# Patient Record
Sex: Male | Born: 1950 | Race: White | Hispanic: No | Marital: Married | Smoking: Former smoker
Health system: Southern US, Community
[De-identification: ages and names within clinical notes are randomized; demographics above are authoritative.]

## PROBLEM LIST (undated history)

## (undated) DIAGNOSIS — H40009 Preglaucoma, unspecified, unspecified eye: Secondary | ICD-10-CM

## (undated) DIAGNOSIS — R001 Bradycardia, unspecified: Secondary | ICD-10-CM

## (undated) DIAGNOSIS — M1990 Unspecified osteoarthritis, unspecified site: Secondary | ICD-10-CM

## (undated) DIAGNOSIS — E785 Hyperlipidemia, unspecified: Secondary | ICD-10-CM

## (undated) DIAGNOSIS — K635 Polyp of colon: Secondary | ICD-10-CM

## (undated) HISTORY — PX: BUNIONECTOMY: SHX129

## (undated) HISTORY — DX: Hyperlipidemia, unspecified: E78.5

## (undated) HISTORY — DX: Bradycardia, unspecified: R00.1

## (undated) HISTORY — DX: Preglaucoma, unspecified, unspecified eye: H40.009

## (undated) HISTORY — DX: Polyp of colon: K63.5

## (undated) HISTORY — PX: PEG TUBE PLACEMENT: SUR1034

## (undated) HISTORY — DX: Unspecified osteoarthritis, unspecified site: M19.90

## (undated) HISTORY — PX: SHOULDER ARTHROSCOPY: SHX128

---

## 1998-10-26 HISTORY — PX: NASAL SEPTUM SURGERY: SHX37

## 1998-12-02 ENCOUNTER — Ambulatory Visit: Admission: RE | Admit: 1998-12-02 | Discharge: 1998-12-02 | Payer: Self-pay | Admitting: Otolaryngology

## 1999-01-24 ENCOUNTER — Other Ambulatory Visit: Admission: RE | Admit: 1999-01-24 | Discharge: 1999-01-24 | Payer: Self-pay | Admitting: Otolaryngology

## 2004-07-25 LAB — HM DIABETES EYE EXAM: HM Diabetic Eye Exam: NEGATIVE

## 2004-08-08 ENCOUNTER — Ambulatory Visit (HOSPITAL_COMMUNITY): Admission: RE | Admit: 2004-08-08 | Discharge: 2004-08-08 | Payer: Self-pay | Admitting: *Deleted

## 2004-08-08 ENCOUNTER — Encounter (INDEPENDENT_AMBULATORY_CARE_PROVIDER_SITE_OTHER): Payer: Self-pay | Admitting: Specialist

## 2010-07-10 ENCOUNTER — Ambulatory Visit
Admission: RE | Admit: 2010-07-10 | Discharge: 2010-07-11 | Payer: Self-pay | Source: Home / Self Care | Admitting: Orthopedic Surgery

## 2010-11-10 ENCOUNTER — Ambulatory Visit
Admission: RE | Admit: 2010-11-10 | Discharge: 2010-11-10 | Payer: Self-pay | Source: Home / Self Care | Attending: Orthopedic Surgery | Admitting: Orthopedic Surgery

## 2010-11-12 LAB — POCT I-STAT 4, (NA,K, GLUC, HGB,HCT)
Glucose, Bld: 132 mg/dL — ABNORMAL HIGH (ref 70–99)
HCT: 43 % (ref 39.0–52.0)
Hemoglobin: 14.6 g/dL (ref 13.0–17.0)
Potassium: 4.1 mEq/L (ref 3.5–5.1)
Sodium: 142 mEq/L (ref 135–145)

## 2010-11-12 LAB — GLUCOSE, CAPILLARY: Glucose-Capillary: 107 mg/dL — ABNORMAL HIGH (ref 70–99)

## 2011-01-08 LAB — GLUCOSE, CAPILLARY
Glucose-Capillary: 139 mg/dL — ABNORMAL HIGH (ref 70–99)
Glucose-Capillary: 143 mg/dL — ABNORMAL HIGH (ref 70–99)
Glucose-Capillary: 155 mg/dL — ABNORMAL HIGH (ref 70–99)
Glucose-Capillary: 184 mg/dL — ABNORMAL HIGH (ref 70–99)

## 2011-01-08 LAB — POCT I-STAT 4, (NA,K, GLUC, HGB,HCT)
Glucose, Bld: 136 mg/dL — ABNORMAL HIGH (ref 70–99)
HCT: 47 % (ref 39.0–52.0)
Hemoglobin: 16 g/dL (ref 13.0–17.0)
Potassium: 4.4 mEq/L (ref 3.5–5.1)
Sodium: 140 mEq/L (ref 135–145)

## 2011-04-07 ENCOUNTER — Encounter (INDEPENDENT_AMBULATORY_CARE_PROVIDER_SITE_OTHER): Payer: BC Managed Care – PPO | Admitting: Vascular Surgery

## 2011-04-07 ENCOUNTER — Encounter (INDEPENDENT_AMBULATORY_CARE_PROVIDER_SITE_OTHER): Payer: BC Managed Care – PPO

## 2011-04-07 DIAGNOSIS — M79609 Pain in unspecified limb: Secondary | ICD-10-CM

## 2011-04-07 DIAGNOSIS — R0989 Other specified symptoms and signs involving the circulatory and respiratory systems: Secondary | ICD-10-CM

## 2011-04-07 NOTE — Consult Note (Signed)
NEW PATIENT CONSULTATION  [name] [name], [name] [name] DOB:  Jun 06, 1951                                       04/07/2011 CHART#:[id number]  This 60 year old male patient had rotator cuff surgery on the left performed by Dr. Simonne Come in the fall of 2011 and has a similar situation in his right shoulder.  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  Dr. Simonne Come noted that the patient had a decreased pulse on the left upper extremity when the arm was in abduction and external rotation, which is a position he works on during his rehab.  The patient's arm goes dead and heavy for a short period time until the arm position is changed.  He denies any history of DVT in the upper extremity or lower extremity and does not have chronic arm pain in the hand, forearm, and upper arm prior to the shoulder surgery.  CHRONIC MEDICAL PROBLEMS: 1. Diabetes mellitus. 2. Degenerative joint disease. 3. Hyperlipidemia. 4. Negative for coronary artery disease or stroke.  SOCIAL HISTORY:  He is married, has 2 children.  Works as an Teacher, adult education for [employer] but has not been able to work since his surgery.  Smokes a half pack of cigarettes per day and does not use alcohol.  FAMILY HISTORY:  Positive for varicose veins in his mother.  Negative for coronary artery disease, diabetes, and stroke.  REVIEW OF SYSTEMS:  Positive for joint pain but otherwise totally negative in complete review of systems.  PHYSICAL EXAMINATION:  Blood pressure 118/68, heart rate 48, respirations 14.  General:  He is a well-developed and well-nourished male in no apparent distress, alert and oriented x3.  HEENT:  Normal for age.  EOMs intact.  Lungs:  Clear to auscultation.  No rhonchi or wheezing.  Cardiovascular:  Regular rate and rhythm.  No murmurs. Carotid pulses are 3+.  No audible bruits.  Abdomen:  Obese.  No palpable masses.  Musculoskeletal:   Free of major deformities with the exception of the shoulders.  Neurologic:  Normal.  Skin:  Free of rashes.  Lower extremity exam reveals 3+ femoral and dorsalis pedis pulses bilaterally.  Upper extremity exam reveals 3+ brachial and radial pulses at rest.  His left arm has difficulty obtaining more than 90 degrees of abduction, and external rotation is quite painful for the gentleman.  He is able to perform this.  There is a decrease in the radial pulse in that position.  The right arm does also not have free motion in the shoulder but at 90 degrees of abduction and external rotation, no radial pulse loss is noted.  Both hands are well-perfused.  Today I ordered upper extremity arterial Dopplers with standard thoracic outlet syndrome maneuvers.  The pressures and waveforms in both upper extremities are normal at rest and with standard maneuvers, there is no diminution.  The patient is unable to perform 180 degrees of extension because of his rotator cuff surgery.  The test is mildly abnormal with some loss of waveforms in certain positions.  I do not think that the patient requires any treatment for thoracic outlet at this time because of the difficulties having with his shoulder joint.  It is certainly not affecting the healing of the shoulder joint or the surgery.  I do not think consideration  for removal of the first rib should be made at this time because of the proximity to his shoulder surgery and the fact that this is not clear.  He could very well be having some compression of the artery in certain positions, which the normal population experiences to some degree.  After further healing and return of range of motion if he continues to have problems, I think he should be referred to Dr. Leonia [name] at [facility], who is a thoracic surgeon who specializes in thoracic outlet type problems for further evaluation. I discussed this at length with the patient.    [name] [name],  M.D. Electronically Signed  JDL/MEDQ  D:  04/07/2011  T:  04/07/2011  Job:  [id number]  cc:   [name] [name], M.D.

## 2011-04-10 DIAGNOSIS — R0989 Other specified symptoms and signs involving the circulatory and respiratory systems: Secondary | ICD-10-CM

## 2011-05-29 ENCOUNTER — Ambulatory Visit (HOSPITAL_BASED_OUTPATIENT_CLINIC_OR_DEPARTMENT_OTHER)
Admission: RE | Admit: 2011-05-29 | Discharge: 2011-05-29 | Disposition: A | Discharge status: Home / Self Care | Payer: BC Managed Care – PPO | Source: Ambulatory Visit | Attending: Orthopedic Surgery | Admitting: Orthopedic Surgery

## 2011-05-29 DIAGNOSIS — M7500 Adhesive capsulitis of unspecified shoulder: Secondary | ICD-10-CM | POA: Insufficient documentation

## 2011-05-29 DIAGNOSIS — M24619 Ankylosis, unspecified shoulder: Secondary | ICD-10-CM | POA: Insufficient documentation

## 2011-05-29 DIAGNOSIS — M659 Unspecified synovitis and tenosynovitis, unspecified site: Secondary | ICD-10-CM | POA: Insufficient documentation

## 2011-05-29 DIAGNOSIS — M24119 Other articular cartilage disorders, unspecified shoulder: Secondary | ICD-10-CM | POA: Insufficient documentation

## 2011-05-29 DIAGNOSIS — E119 Type 2 diabetes mellitus without complications: Secondary | ICD-10-CM | POA: Insufficient documentation

## 2011-05-29 DIAGNOSIS — Z01812 Encounter for preprocedural laboratory examination: Secondary | ICD-10-CM | POA: Insufficient documentation

## 2011-05-29 DIAGNOSIS — F17200 Nicotine dependence, unspecified, uncomplicated: Secondary | ICD-10-CM | POA: Insufficient documentation

## 2011-05-29 LAB — GLUCOSE, CAPILLARY: Glucose-Capillary: 90 mg/dL (ref 70–99)

## 2011-05-29 LAB — POCT I-STAT 4, (NA,K, GLUC, HGB,HCT)
Glucose, Bld: 104 mg/dL — ABNORMAL HIGH (ref 70–99)
HCT: 43 % (ref 39.0–52.0)
Hemoglobin: 14.6 g/dL (ref 13.0–17.0)
Potassium: 4 mEq/L (ref 3.5–5.1)
Sodium: 140 mEq/L (ref 135–145)

## 2011-06-08 NOTE — Op Note (Signed)
[name], [name] [name]                  ACCOUNT NO.:  [id number]  MEDICAL RECORD NO.:  [id number]  LOCATION:                                 FACILITY:  PHYSICIAN:  Marlowe Kays, [name].D.  DATE OF BIRTH:  [birth year]  DATE OF PROCEDURE:  05/29/2011 DATE OF DISCHARGE:                              OPERATIVE REPORT   PREOPERATIVE DIAGNOSES: 1. Diffuse labral tear. 2. Adhesive capsulitis, right shoulder.  POSTOPERATIVE DIAGNOSES: 1. Diffuse labral tear. 2. Adhesive capsulitis, right shoulder.  OPERATION: 1. Right shoulder arthroscopy with debridement of degenerative torn     labrum. 2. Closed manipulation, right shoulder.  SURGEON:  Marlowe Kays, [name].D.  ASSISTANTDruscilla Brownie. Cherlynn June.  ANESTHESIA:  General.  PATHOLOGY AND JUSTIFICATION FOR PROCEDURE:  He has had persistent pain and loss of motion despite physical therapy in his right shoulder.  MRI of April 30, 2011, demonstrated preoperative diagnoses.  He is here today for the above-mentioned surgery accordingly.  DESCRIPTION OF PROCEDURE:  Interscalene block by anesthesia, followed by general anesthesia, beach-chair position on the sliding frame, right shoulder girdle was prepped with DuraPrep, draped in sterile field. Time-out performed.  I first checked his motion prior to the arthroscopy and found he had about 30 degrees of abduction, 30 degrees of flexion, and basically no external or internal rotation.  I then marked out the anatomy of the shoulder and through a posterior soft-spot portal atraumatically entered glenohumeral joint, although with some difficulty because of the tightness of the joint.  On inspection, there was some synovitis present and the line of the joint was quite inflamed.  It was very difficult.  He had been examining the anterior of the joint because of its tightness.  Biceps tendon was noted to be intact at its base or attachment to the labrum.  Degenerative-type tearing could be found  both anteriorly and posteriorly to it.  There was some wear of the glenoid, but humeral head looked good.  Because I was unable to get the normal interval between the biceps tendon subscapularis, I went medial to the biceps tendon and using a switching stick, made an anterior incision over which I placed a metal cannula and entered the joint of the 4.2 shaver.  With the shaver, I was able to retract the biceps tendon medially to look at the glenoid in better fashion and debrided the labrum down in its entirety.  Final pictures were taken and then removed all fluid possible from the glenohumeral joint and went ahead and perform a closed manipulation of his shoulder increasing abduction from 30 to 110 degrees, flexion from 30 to 110 degrees, external rotation from 0 to 90, and internal rotation from 0 to 45.  There was no audible or palpable popping of adhesions, with just general gradual smudging in improvement of the motion, then closed the 2 portals with a 4-0 nylon followed by Betadine, Adaptic, dry sterile dressing, shoulder immobilizer.  He tolerated the procedure well, and at the time of this dictation was on his way to the recovery room in satisfactory condition with no known complications.          ______________________________ Marlowe Kays, [name].D.  JA/MEDQ  D:  05/29/2011  T:  05/30/2011  Job:  [id number]  Electronically Signed by Marlowe Kays [name].D. on 06/08/2011 01:09:27 PM

## 2012-05-04 ENCOUNTER — Encounter: Payer: Self-pay | Admitting: Internal Medicine

## 2012-05-04 ENCOUNTER — Ambulatory Visit (INDEPENDENT_AMBULATORY_CARE_PROVIDER_SITE_OTHER): Payer: BC Managed Care – PPO | Admitting: Internal Medicine

## 2012-05-04 VITALS — BP 130/74 | HR 50 | Temp 97.6°F | Ht 77.0 in | Wt 280.0 lb

## 2012-05-04 DIAGNOSIS — R001 Bradycardia, unspecified: Secondary | ICD-10-CM

## 2012-05-04 DIAGNOSIS — E1129 Type 2 diabetes mellitus with other diabetic kidney complication: Secondary | ICD-10-CM | POA: Insufficient documentation

## 2012-05-04 DIAGNOSIS — M1990 Unspecified osteoarthritis, unspecified site: Secondary | ICD-10-CM | POA: Insufficient documentation

## 2012-05-04 DIAGNOSIS — I498 Other specified cardiac arrhythmias: Secondary | ICD-10-CM

## 2012-05-04 DIAGNOSIS — M25519 Pain in unspecified shoulder: Secondary | ICD-10-CM

## 2012-05-04 DIAGNOSIS — E782 Mixed hyperlipidemia: Secondary | ICD-10-CM | POA: Insufficient documentation

## 2012-05-04 DIAGNOSIS — E785 Hyperlipidemia, unspecified: Secondary | ICD-10-CM

## 2012-05-04 DIAGNOSIS — E119 Type 2 diabetes mellitus without complications: Secondary | ICD-10-CM

## 2012-05-04 HISTORY — DX: Bradycardia, unspecified: R00.1

## 2012-05-04 LAB — AST: AST: 16 U/L (ref 0–37)

## 2012-05-04 LAB — LIPID PANEL
Cholesterol: 139 mg/dL (ref 0–200)
HDL: 52.6 mg/dL (ref >39.00)
LDL Cholesterol: 70 mg/dL (ref 0–99)
Total CHOL/HDL Ratio: 3
Triglycerides: 80 mg/dL (ref 0.0–149.0)
VLDL: 16 mg/dL (ref 0.0–40.0)

## 2012-05-04 LAB — BASIC METABOLIC PANEL
BUN: 20 mg/dL (ref 6–23)
CO2: 26 mEq/L (ref 19–32)
Calcium: 9 mg/dL (ref 8.4–10.5)
Chloride: 105 mEq/L (ref 96–112)
Creatinine, Ser: 1.1 mg/dL (ref 0.4–1.5)
GFR: 74.18 mL/min (ref >60.00)
Glucose, Bld: 135 mg/dL — ABNORMAL HIGH (ref 70–99)
Potassium: 3.9 mEq/L (ref 3.5–5.1)
Sodium: 139 mEq/L (ref 135–145)

## 2012-05-04 LAB — ALT: ALT: 20 U/L (ref 0–53)

## 2012-05-04 LAB — HEMOGLOBIN A1C: Hgb A1c MFr Bld: 7.3 % — ABNORMAL HIGH (ref 4.6–6.5)

## 2012-05-04 MED ORDER — METFORMIN HCL 1000 MG PO TABS: 1000.0000 mg | ORAL_TABLET | Freq: Two times a day (BID) | ORAL | Status: DC

## 2012-05-04 NOTE — Assessment & Plan Note (Signed)
Good medication compliance, patient is fasting, will check FLP

## 2012-05-04 NOTE — Assessment & Plan Note (Signed)
Good medication compliance, blood sugars used to be around 120, here lately going up to 140 sometimes. Plan: Refill medicines Labs Exercise discussed

## 2012-05-04 NOTE — Progress Notes (Addendum)
  Subjective:    Patient ID: [name] [name], male    DOB: May 28, 1951, 61 y.o.   MRN: [id number]  HPI New patient Transferring  From [facility]  Diabetes, good medication compliance, his blood sugars have been usually around 120 but here lately they have increased to 140. Does not have hypertension, take ACE inhibitors due to diabetes. High cholesterol, good medication compliance, he is fasting today.  Past medical history Diabetes High cholesterol Colon polyps at age 22, next Cscope age 64  Past surgical history Deviated septum 2000 arthroscopic Left shoulder 2011 Arthroscopic Right shoulder 2012  Social history Married, Children x 2 Tobacco-- quit 10-2011  ETOH-- socially  Education HS, occupation Psychologist, sport and exercise   Family history Diabetes--uncle CAD-- no Stroke-- uncle  Colon cancer-- no Lung  cancer-- F Prostate cancer--no   Review of Systems No nausea, vomiting, diarrhea. No blood in the stools. No lower extremity edema Has ongoing shoulder discomfort, sees orthopedic surgery regularly, symptoms causesome difficulty sleeping.    Objective:   Physical Exam General -- alert, well-developed Lungs -- normal respiratory effort, no intercostal retractions, no accessory muscle use, and normal breath sounds.   Heart-- normal rate, regular rhythm, no murmur, and no gallop.   Extremities-- no pretibial edema bilaterally Neurologic-- alert & oriented X3 and strength normal in all extremities. Psych-- Cognition and judgment appear intact. Alert and cooperative with normal attention span and concentration.  not anxious appearing and not depressed appearing.       Assessment & Plan:   Several pages of old records reviewed today, I did not see a report of a colonoscopy. Kidney function has been consistently normal 02/04/2011  Total cholesterol 169, triglycerides 109, HDL 52, LDL 95 PSA 0.6 08/04/2011--- A1c 6.3 Will send all labs to be scanned. JP 05-20-2012

## 2012-05-04 NOTE — Assessment & Plan Note (Signed)
Pulse noted to be 50 today, states that his pulse has always been low from 40-50. He is asymptomatic.

## 2012-05-09 ENCOUNTER — Other Ambulatory Visit: Payer: Self-pay | Admitting: *Deleted

## 2012-05-09 ENCOUNTER — Telehealth: Payer: Self-pay | Admitting: *Deleted

## 2012-05-09 MED ORDER — SITAGLIPTIN PHOSPHATE 100 MG PO TABS: 100.0000 mg | ORAL_TABLET | Freq: Every day | ORAL | Status: DC

## 2012-05-09 NOTE — Telephone Encounter (Signed)
Pt left vm on triage line to request lab results, please advise

## 2012-05-09 NOTE — Telephone Encounter (Signed)
See labs Pt already aware of labs.

## 2012-05-10 ENCOUNTER — Telehealth: Payer: Self-pay | Admitting: *Deleted

## 2012-05-10 NOTE — Telephone Encounter (Signed)
Pt called triage line to request numbers for his A1C, advised 7.3 and mailed copy of results to home address, pt did note that he picked up his new RX yesterday. Pt understood all instructions/results

## 2012-05-30 ENCOUNTER — Encounter: Payer: Self-pay | Admitting: Internal Medicine

## 2012-05-30 MED ORDER — SITAGLIPTIN PHOSPHATE 100 MG PO TABS: 100.0000 mg | ORAL_TABLET | Freq: Every day | ORAL | Status: DC

## 2012-05-30 NOTE — Telephone Encounter (Signed)
I have sent in 90 day supply to your pharmacy.

## 2012-05-30 NOTE — Addendum Note (Signed)
Addended by: Edwena Felty T on: 05/30/2012 04:28 PM   Modules accepted: Orders

## 2012-08-01 ENCOUNTER — Other Ambulatory Visit: Payer: Self-pay | Admitting: *Deleted

## 2012-08-01 ENCOUNTER — Encounter: Payer: Self-pay | Admitting: Internal Medicine

## 2012-08-01 MED ORDER — LISINOPRIL 2.5 MG PO TABS: 2.5000 mg | ORAL_TABLET | Freq: Every day | ORAL | Status: DC

## 2012-08-01 NOTE — Telephone Encounter (Signed)
Refill for lisinopril 2.5mg . rx sent to pharmacy.

## 2012-08-04 ENCOUNTER — Encounter: Payer: Self-pay | Admitting: Internal Medicine

## 2012-08-04 ENCOUNTER — Ambulatory Visit (INDEPENDENT_AMBULATORY_CARE_PROVIDER_SITE_OTHER): Payer: BC Managed Care – PPO | Admitting: Internal Medicine

## 2012-08-04 VITALS — BP 134/72 | HR 53 | Temp 97.9°F | Ht 77.25 in | Wt 268.0 lb

## 2012-08-04 DIAGNOSIS — E119 Type 2 diabetes mellitus without complications: Secondary | ICD-10-CM

## 2012-08-04 DIAGNOSIS — N529 Male erectile dysfunction, unspecified: Secondary | ICD-10-CM

## 2012-08-04 DIAGNOSIS — Z23 Encounter for immunization: Secondary | ICD-10-CM

## 2012-08-04 DIAGNOSIS — Z0000 Encounter for general adult medical examination without abnormal findings: Secondary | ICD-10-CM | POA: Insufficient documentation

## 2012-08-04 DIAGNOSIS — M1990 Unspecified osteoarthritis, unspecified site: Secondary | ICD-10-CM

## 2012-08-04 LAB — PSA: PSA: 0.67 ng/mL (ref 0.10–4.00)

## 2012-08-04 LAB — CBC WITH DIFFERENTIAL/PLATELET
Basophils Absolute: 0 10*3/uL (ref 0.0–0.1)
Basophils Relative: 0.5 % (ref 0.0–3.0)
Eosinophils Absolute: 0.2 10*3/uL (ref 0.0–0.7)
Eosinophils Relative: 2.4 % (ref 0.0–5.0)
HCT: 45.4 % (ref 39.0–52.0)
Hemoglobin: 15.2 g/dL (ref 13.0–17.0)
Lymphocytes Relative: 26.4 % (ref 12.0–46.0)
Lymphs Abs: 2 10*3/uL (ref 0.7–4.0)
MCHC: 33.4 g/dL (ref 30.0–36.0)
MCV: 95.1 fl (ref 78.0–100.0)
Monocytes Absolute: 0.5 10*3/uL (ref 0.1–1.0)
Monocytes Relative: 6.7 % (ref 3.0–12.0)
Neutro Abs: 4.9 10*3/uL (ref 1.4–7.7)
Neutrophils Relative %: 64 % (ref 43.0–77.0)
Platelets: 200 10*3/uL (ref 150.0–400.0)
RBC: 4.78 Mil/uL (ref 4.22–5.81)
RDW: 13.6 % (ref 11.5–14.6)
WBC: 7.7 10*3/uL (ref 4.5–10.5)

## 2012-08-04 LAB — COMPREHENSIVE METABOLIC PANEL
ALT: 15 U/L (ref 0–53)
AST: 16 U/L (ref 0–37)
Albumin: 4.1 g/dL (ref 3.5–5.2)
Alkaline Phosphatase: 59 U/L (ref 39–117)
BUN: 17 mg/dL (ref 6–23)
CO2: 27 mEq/L (ref 19–32)
Calcium: 9.5 mg/dL (ref 8.4–10.5)
Chloride: 101 mEq/L (ref 96–112)
Creatinine, Ser: 1.1 mg/dL (ref 0.4–1.5)
GFR: 71.81 mL/min (ref >60.00)
Glucose, Bld: 104 mg/dL — ABNORMAL HIGH (ref 70–99)
Potassium: 4 mEq/L (ref 3.5–5.1)
Sodium: 136 mEq/L (ref 135–145)
Total Bilirubin: 0.9 mg/dL (ref 0.3–1.2)
Total Protein: 6.9 g/dL (ref 6.0–8.3)

## 2012-08-04 LAB — HEMOGLOBIN A1C: Hgb A1c MFr Bld: 6.4 % (ref 4.6–6.5)

## 2012-08-04 MED ORDER — GLUCOSE BLOOD VI STRP: ORAL_STRIP | Status: DC

## 2012-08-04 MED ORDER — ATORVASTATIN CALCIUM 40 MG PO TABS: 20.0000 mg | ORAL_TABLET | Freq: Every day | ORAL | Status: DC

## 2012-08-04 MED ORDER — VARDENAFIL HCL 20 MG PO TABS: 20.0000 mg | ORAL_TABLET | Freq: Every day | ORAL | Status: DC | PRN

## 2012-08-04 MED ORDER — METFORMIN HCL ER (MOD) 1000 MG PO TB24: ORAL_TABLET | ORAL | Status: DC

## 2012-08-04 MED ORDER — FREESTYLE LANCETS MISC: Status: DC

## 2012-08-04 MED ORDER — ATORVASTATIN CALCIUM 40 MG PO TABS: 40.0000 mg | ORAL_TABLET | Freq: Every day | ORAL | Status: DC

## 2012-08-04 NOTE — Assessment & Plan Note (Signed)
Now on disability due to her shoulder pain. 3 weeks ago started Cymbalta (ortho) which seems to be helping.

## 2012-08-04 NOTE — Progress Notes (Signed)
  Subjective:    Patient ID: [name] [name], male    DOB: January 16, 1951, 61 y.o.   MRN: [id number]  HPI CPX  Past medical history Diabetes (on ACEi) High cholesterol DJD Colon polyps at age 54, next Cscope age 16  Past surgical history Deviated septum 2000 arthroscopic Left shoulder 2011 Arthroscopic Right shoulder 2012  Social history Married, Children x 2 Tobacco-- quit 10-2011   ETOH-- socially   Education HS, on disability d/t DJD  Diet average  Exercise-- active at home, no actual exercise  Family history Diabetes--uncle CAD-- no Stroke-- uncle   Colon cancer-- no Lung  cancer-- F Prostate cancer--no   Review of Systems Doing well, med list is updated, he takes Levitra and extended release metformin. Started cymbalta as prescribed by his orthopedic surgeon for chronic shoulder pain. Denies chest pain, shortness of breath No nausea, vomiting, diarrhea. No dysuria gross hematuria, occasionally difficulty with his urinary flow (mild), denies frequency, very seldom has nocturia      Objective:   Physical Exam  General -- alert, well-developed, central obesity noted.   Neck --no thyromegaly Lungs -- normal respiratory effort, no intercostal retractions, no accessory muscle use, and normal breath sounds.   Heart-- normal rate, regular rhythm, no murmur, and no gallop.   Abdomen--soft, non-tender, no distention, no masses, no HSM, no guarding, and no rigidity.   DIABETIC FEET EXAM: No lower extremity edema Normal pedal pulses bilaterally Skin and nails are normal without calluses Pinprick examination of the feet normal.  Rectal-- No external abnormalities noted. Normal sphincter tone. No rectal masses or tenderness. Brown stool, Hemoccult negative Prostate:  Prostate gland firm and smooth, no enlargement, nodularity, tenderness, mass, asymmetry or induration. Neurologic-- alert & oriented X3 and strength normal in all extremities. Psych-- Cognition and judgment  appear intact. Alert and cooperative with normal attention span and concentration.  not anxious appearing and not depressed appearing.       Assessment & Plan:

## 2012-08-04 NOTE — Assessment & Plan Note (Signed)
On levitra x a while, works well Needs refill

## 2012-08-04 NOTE — Assessment & Plan Note (Addendum)
Medications changed to metformin extended release that he has been taking all along. Refills provided. Labs Feet exam (-)  today

## 2012-08-04 NOTE — Assessment & Plan Note (Addendum)
Td last? --- today Flu shot today Zostavax, pneumonia-- discuss next year Had a colonoscopy at age 61, was recommended to repeat a colonoscopy at age 39. Old records reviewed,   no report found. Plan---> repeat colonoscopy in 2 years Hemoccult negative today. Diet and exercise discussed

## 2012-08-29 LAB — HM DIABETES EYE EXAM

## 2012-09-02 ENCOUNTER — Encounter: Payer: Self-pay | Admitting: *Deleted

## 2012-10-03 ENCOUNTER — Encounter: Payer: Self-pay | Admitting: *Deleted

## 2012-10-21 ENCOUNTER — Other Ambulatory Visit: Payer: Self-pay | Admitting: Internal Medicine

## 2012-10-21 NOTE — Telephone Encounter (Signed)
Refill done.  

## 2012-11-29 ENCOUNTER — Encounter: Payer: Self-pay | Admitting: Internal Medicine

## 2012-12-05 ENCOUNTER — Ambulatory Visit (INDEPENDENT_AMBULATORY_CARE_PROVIDER_SITE_OTHER): Payer: Medicare Other | Admitting: Internal Medicine

## 2012-12-05 ENCOUNTER — Encounter: Payer: Self-pay | Admitting: Internal Medicine

## 2012-12-05 VITALS — BP 128/64 | HR 54 | Temp 97.6°F | Wt 276.0 lb

## 2012-12-05 DIAGNOSIS — Z23 Encounter for immunization: Secondary | ICD-10-CM

## 2012-12-05 DIAGNOSIS — E119 Type 2 diabetes mellitus without complications: Secondary | ICD-10-CM

## 2012-12-05 DIAGNOSIS — R001 Bradycardia, unspecified: Secondary | ICD-10-CM

## 2012-12-05 DIAGNOSIS — Z0000 Encounter for general adult medical examination without abnormal findings: Secondary | ICD-10-CM

## 2012-12-05 DIAGNOSIS — I498 Other specified cardiac arrhythmias: Secondary | ICD-10-CM | POA: Diagnosis not present

## 2012-12-05 LAB — HEMOGLOBIN A1C: Hgb A1c MFr Bld: 6.3 % (ref 4.6–6.5)

## 2012-12-05 MED ORDER — SITAGLIPTIN PHOSPHATE 100 MG PO TABS: 100.0000 mg | ORAL_TABLET | Freq: Every day | ORAL | Status: DC

## 2012-12-05 NOTE — Assessment & Plan Note (Signed)
Zostavax provided today

## 2012-12-05 NOTE — Progress Notes (Signed)
  Subjective:    Patient ID: [name] [name], male    DOB: Mar 16, 1951, 62 y.o.   MRN: [id number]  HPI Routine office visit, feeling well. Diabetes, ambulatory blood sugars in the 90s. Denies any low blood sugar symptoms.   Past Medical History  Diagnosis Date  . Diabetes mellitus   . Hyperlipidemia   . DJD (degenerative joint disease)   . Colon polyps     Colon polyps at age 28, next Cscope age 72   Past Surgical History  Procedure Laterality Date  . Nasal septum surgery  2000  . Shoulder arthroscopy      L 2011, R 2012    Review of Systems Medication list reviewed, good compliance. Patient is trying to wean himself off Cymbalta as he does not see much benefit. Had a flu shot. Denies chest pain, shortness of breath. No nausea, vomiting, diarrhea.    Objective:   Physical Exam General -- alert, well-developed Lungs -- normal respiratory effort, no intercostal retractions, no accessory muscle use, and normal breath sounds.   Heart-- normal rate, regular rhythm, no murmur, and no gallop.   Extremities-- no pretibial edema bilaterally  Psych-- Cognition and judgment appear intact. Alert and cooperative with normal attention span and concentration.  not anxious appearing and not depressed appearing.       Assessment & Plan:

## 2012-12-05 NOTE — Assessment & Plan Note (Addendum)
Eye exam---> reports had one within a year, encouraged to have one at  least yearly Feet exam negative 10- 2013 Doing well, check A1c.

## 2012-12-05 NOTE — Assessment & Plan Note (Signed)
Long history of bradycardia, asymptomatic,baseline EKGs show sinus bradycardia

## 2012-12-16 DIAGNOSIS — B079 Viral wart, unspecified: Secondary | ICD-10-CM | POA: Diagnosis not present

## 2012-12-16 DIAGNOSIS — L723 Sebaceous cyst: Secondary | ICD-10-CM | POA: Diagnosis not present

## 2012-12-16 DIAGNOSIS — D2360 Other benign neoplasm of skin of unspecified upper limb, including shoulder: Secondary | ICD-10-CM | POA: Diagnosis not present

## 2012-12-16 DIAGNOSIS — D239 Other benign neoplasm of skin, unspecified: Secondary | ICD-10-CM | POA: Diagnosis not present

## 2012-12-16 DIAGNOSIS — D485 Neoplasm of uncertain behavior of skin: Secondary | ICD-10-CM | POA: Diagnosis not present

## 2012-12-16 DIAGNOSIS — L819 Disorder of pigmentation, unspecified: Secondary | ICD-10-CM | POA: Diagnosis not present

## 2012-12-16 DIAGNOSIS — L909 Atrophic disorder of skin, unspecified: Secondary | ICD-10-CM | POA: Diagnosis not present

## 2012-12-16 DIAGNOSIS — L919 Hypertrophic disorder of the skin, unspecified: Secondary | ICD-10-CM | POA: Diagnosis not present

## 2012-12-16 DIAGNOSIS — L821 Other seborrheic keratosis: Secondary | ICD-10-CM | POA: Diagnosis not present

## 2013-01-10 DIAGNOSIS — L723 Sebaceous cyst: Secondary | ICD-10-CM | POA: Diagnosis not present

## 2013-06-05 ENCOUNTER — Ambulatory Visit: Payer: BLUE CROSS/BLUE SHIELD | Admitting: Internal Medicine

## 2013-06-27 ENCOUNTER — Encounter: Payer: Self-pay | Admitting: Internal Medicine

## 2013-06-27 ENCOUNTER — Ambulatory Visit (INDEPENDENT_AMBULATORY_CARE_PROVIDER_SITE_OTHER): Payer: Medicare Other | Admitting: Internal Medicine

## 2013-06-27 VITALS — BP 130/70 | HR 52 | Temp 97.6°F | Wt 266.4 lb

## 2013-06-27 DIAGNOSIS — N529 Male erectile dysfunction, unspecified: Secondary | ICD-10-CM

## 2013-06-27 DIAGNOSIS — E119 Type 2 diabetes mellitus without complications: Secondary | ICD-10-CM

## 2013-06-27 DIAGNOSIS — M1990 Unspecified osteoarthritis, unspecified site: Secondary | ICD-10-CM | POA: Diagnosis not present

## 2013-06-27 DIAGNOSIS — E785 Hyperlipidemia, unspecified: Secondary | ICD-10-CM

## 2013-06-27 DIAGNOSIS — I10 Essential (primary) hypertension: Secondary | ICD-10-CM

## 2013-06-27 LAB — BASIC METABOLIC PANEL
BUN: 15 mg/dL (ref 6–23)
CO2: 26 mEq/L (ref 19–32)
Calcium: 9.4 mg/dL (ref 8.4–10.5)
Chloride: 103 mEq/L (ref 96–112)
Creatinine, Ser: 1.1 mg/dL (ref 0.4–1.5)
GFR: 75.5 mL/min (ref >60.00)
Glucose, Bld: 107 mg/dL — ABNORMAL HIGH (ref 70–99)
Potassium: 4 mEq/L (ref 3.5–5.1)
Sodium: 136 mEq/L (ref 135–145)

## 2013-06-27 LAB — LIPID PANEL
Cholesterol: 134 mg/dL (ref 0–200)
HDL: 56.7 mg/dL (ref >39.00)
LDL Cholesterol: 60 mg/dL (ref 0–99)
Total CHOL/HDL Ratio: 2
Triglycerides: 86 mg/dL (ref 0.0–149.0)
VLDL: 17.2 mg/dL (ref 0.0–40.0)

## 2013-06-27 LAB — ALT: ALT: 20 U/L (ref 0–53)

## 2013-06-27 LAB — HEMOGLOBIN A1C: Hgb A1c MFr Bld: 6.3 % (ref 4.6–6.5)

## 2013-06-27 LAB — AST: AST: 17 U/L (ref 0–37)

## 2013-06-27 MED ORDER — VARDENAFIL HCL 20 MG PO TABS: 20.0000 mg | ORAL_TABLET | Freq: Every day | ORAL | Status: DC | PRN

## 2013-06-27 MED ORDER — SITAGLIPTIN PHOSPHATE 100 MG PO TABS: 100.0000 mg | ORAL_TABLET | Freq: Every day | ORAL | Status: DC

## 2013-06-27 MED ORDER — LISINOPRIL 2.5 MG PO TABS: ORAL_TABLET | ORAL | Status: DC

## 2013-06-27 MED ORDER — ATORVASTATIN CALCIUM 40 MG PO TABS: 20.0000 mg | ORAL_TABLET | Freq: Every day | ORAL | Status: DC

## 2013-06-27 MED ORDER — METFORMIN HCL ER (MOD) 1000 MG PO TB24: ORAL_TABLET | ORAL | Status: DC

## 2013-06-27 NOTE — Assessment & Plan Note (Addendum)
Still has pain from time to time, uses cymbalta as needed. Had right heel pain few months ago, symptoms resolve, diabetes feet exam negative. Recommend observation.

## 2013-06-27 NOTE — Assessment & Plan Note (Signed)
Labs

## 2013-06-27 NOTE — Patient Instructions (Addendum)
Get your blood work before you leave  Next visit in 4 months, for a physical exam  Please make an appointment before you leave the office today (or call few weeks in advance)    Hypoglycemia (Low Blood Sugar) Hypoglycemia is when the glucose (sugar) in your blood is too low. Hypoglycemia can happen for many reasons. It can happen to people with or without diabetes. Hypoglycemia can develop quickly and can be a medical emergency.  CAUSES  Having hypoglycemia does not mean that you will develop diabetes. Different causes include:  Missed or delayed meals or not enough carbohydrates eaten.  Medication overdose. This could be by accident or deliberate. If by accident, your medication may need to be adjusted or changed.  Exercise or increased activity without adjustments in carbohydrates or medications.  A nerve disorder that affects body functions like your heart rate, blood pressure and digestion (autonomic neuropathy).  A condition where the stomach muscles do not function properly (gastroparesis). Therefore, medications may not absorb properly.  The inability to recognize the signs of hypoglycemia (hypoglycemic unawareness).  Absorption of insulin  may be altered.  Alcohol consumption.  Pregnancy/menstrual cycles/postpartum. This may be due to hormones.  Certain kinds of tumors. This is very rare. SYMPTOMS   Sweating.  Hunger.  Dizziness.  Blurred vision.  Drowsiness.  Weakness.  Headache.  Rapid heart beat.  Shakiness.  Nervousness. DIAGNOSIS  Diagnosis is made by monitoring blood glucose in one or all of the following ways:  Fingerstick blood glucose monitoring.  Laboratory results. TREATMENT  If you think your blood glucose is low:  Check your blood glucose, if possible. If it is less than 70 mg/dl, take one of the following:  3-4 glucose tablets.   cup juice (prefer clear like apple).   cup "regular" soda pop.  1 cup milk.  -1 tube of glucose  gel.  5-6 hard candies.  Do not over treat because your blood glucose (sugar) will only go too high.  Wait 15 minutes and recheck your blood glucose. If it is still less than 70 mg/dl (or below your target range), repeat treatment.  Eat a snack if it is more than one hour until your next meal. Sometimes, your blood glucose may go so low that you are unable to treat yourself. You may need someone to help you. You may even pass out or be unable to swallow. This may require you to get an injection of glucagon, which raises the blood glucose. HOME CARE INSTRUCTIONS  Check blood glucose as recommended by your caregiver.  Take medication as prescribed by your caregiver.  Follow your meal plan. Do not skip meals. Eat on time.  If you are going to drink alcohol, drink it only with meals.  Check your blood glucose before driving.  Check your blood glucose before and after exercise. If you exercise longer or different than usual, be sure to check blood glucose more frequently.  Always carry treatment with you. Glucose tablets are the easiest to carry.  Always wear medical alert jewelry or carry some form of identification that states that you have diabetes. This will alert people that you have diabetes. If you have hypoglycemia, they will have a better idea on what to do. SEEK MEDICAL CARE IF:   You are having problems keeping your blood sugar at target range.  You are having frequent episodes of hypoglycemia.  You feel you might be having side effects from your medicines.  You have symptoms of an illness  that is not improving after 3-4 days.  You notice a change in vision or a new problem with your vision. SEEK IMMEDIATE MEDICAL CARE IF:   You are a family member or friend of a person whose blood glucose goes below 70 mg/dl and is accompanied by:  Confusion.  A change in mental status.  The inability to swallow.  Passing out. Document Released: 10/12/2005 Document Revised:  01/04/2012 Document Reviewed: 02/08/2012 [facility] Patient Information 2014 [place], [place].   Diabetes and Foot Care Diabetes may cause you to have a poor blood supply (circulation) to your legs and feet. Because of this, the skin may be thinner, break easier, and heal more slowly. You also may have nerve damage in your legs and feet causing decreased feeling. You may not notice minor injuries to your feet that could lead to serious problems or infections. Taking care of your feet is one of the most important things you can do for yourself.  HOME CARE INSTRUCTIONS  Do not go barefoot. Bare feet are easily injured.  Check your feet daily for blisters, cuts, and redness.  Wash your feet with warm water (not hot) and mild soap. Pat your feet and between your toes until completely dry.  Apply a moisturizing lotion that does not contain alcohol or petroleum jelly to the dry skin on your feet and to dry brittle toenails. Do not put it between your toes.  Trim your toenails straight across. Do not dig under them or around the cuticle.  Do not cut corns or calluses, or try to remove them with medicine.  Wear clean cotton socks or stockings every day. Make sure they are not too tight. Do not wear knee high stockings since they may decrease blood flow to your legs.  Wear leather shoes that fit properly and have enough cushioning. To break in new shoes, wear them just a few hours a day to avoid injuring your feet.  Wear shoes at all times, even in the house.  Do not cross your legs. This may decrease the blood flow to your feet.  If you find a minor scrape, cut, or break in the skin on your feet, keep it and the skin around it clean and dry. These areas may be cleansed with mild soap and water. Do not use peroxide, alcohol, iodine or Merthiolate.  When you remove an adhesive bandage, be sure not to harm the skin around it.  If you have a wound, look at it several times a day to make sure it is  healing.  Do not use heating pads or hot water bottles. Burns can occur. If you have lost feeling in your feet or legs, you may not know it is happening until it is too late.  Report any cuts, sores or bruises to your caregiver. Do not wait! SEEK MEDICAL CARE IF:   You have an injury that is not healing or you notice redness, numbness, burning, or tingling.  Your feet always feel cold.  You have pain or cramps in your legs and feet. SEEK IMMEDIATE MEDICAL CARE IF:   There is increasing redness, swelling, or increasing pain in the wound.  There is a red line that goes up your leg.  Pus is coming from a wound.  You develop an unexplained oral temperature above 102 F (38.9 C), or as your caregiver suggests.  You notice a bad smell coming from an ulcer or wound. MAKE SURE YOU:   Understand these instructions.  Will watch  your condition.  Will get help right away if you are not doing well or get worse. Document Released: 10/09/2000 Document Revised: 01/04/2012 Document Reviewed: 04/17/2009 [facility] Patient Information 2014 [place], [place].

## 2013-06-27 NOTE — Assessment & Plan Note (Signed)
Seems to be doing well, CBG was low a couple of times, hypoglycemic symptoms reviewed. Labs. Feet exam normal, feet exam  reviewed.

## 2013-06-27 NOTE — Progress Notes (Signed)
  Subjective:    Patient ID: [name] [name], male    DOB: 1951-04-21, 62 y.o.   MRN: [id number]  HPI Routine  office visit Diabetes, good medication compliance, ambulatory blood sugars in the morning around 97. No apparent side effects DJD, on Cymbalta for his shoulder pain prn, has not needed it for the last 3 months. 3 months ago for a day or 2 had a burning feeling in the in the right heel, no swelling or redness, symptoms resolved. Denies any numbness or burning in his feet at night.  Past Medical History  Diagnosis Date  . Diabetes mellitus   . Hyperlipidemia   . DJD (degenerative joint disease)   . Colon polyps     Colon polyps at age 58, next Cscope age 39   Past Surgical History  Procedure Laterality Date  . Nasal septum surgery  2000  . Shoulder arthroscopy      L 2011, R 2012      Review of Systems No chest pain, shortness or breath, lower extremity edema No nausea, vomiting, diarrhea. Twice  felt tired, check his  CBGs =58, 64. Symptoms improved after eating. Denies any sweatiness, loss of consciousness.    Objective:   Physical Exam BP 130/70  Pulse 52  Temp(Src) 97.6 F (36.4 C)  Wt 266 lb 6.4 oz (120.838 kg)  BMI 31.39 kg/m2  SpO2 98%  General -- alert, well-developed, NAD.  Lungs -- normal respiratory effort, no intercostal retractions, no accessory muscle use, and normal breath sounds.  Heart-- normal rate, regular rhythm, no murmur.  DIABETIC FEET EXAM: No lower extremity edema Normal pedal pulses bilaterally Skin and nails are normal without calluses Pinprick examination of the feet normal. Psych-- Cognition and judgment appear intact. Alert and cooperative with normal attention span and concentration. not anxious appearing and not depressed appearing.       Assessment & Plan:

## 2013-08-15 DIAGNOSIS — Z23 Encounter for immunization: Secondary | ICD-10-CM | POA: Diagnosis not present

## 2013-08-16 ENCOUNTER — Encounter: Payer: Self-pay | Admitting: Internal Medicine

## 2013-09-12 DIAGNOSIS — E119 Type 2 diabetes mellitus without complications: Secondary | ICD-10-CM | POA: Diagnosis not present

## 2013-09-12 LAB — HM DIABETES EYE EXAM: HM Diabetic Eye Exam: NEGATIVE

## 2013-10-25 ENCOUNTER — Telehealth: Payer: Self-pay

## 2013-10-25 NOTE — Telephone Encounter (Signed)
Medication List and allergies:  Reviewed and updated  90 day supply/mail order: na Local [facility]: [facility] [address]  Immunizations due: UTD  A/P:   No changes to FH or PSH or personal hx Flu vaccine--07/2013 Shingles--11/2012 Tdap--07/2012 CCS--06/2004 PSA--07/2012--0.67  To Discuss with Provider: Not at this time

## 2013-10-30 ENCOUNTER — Encounter: Payer: Self-pay | Admitting: Internal Medicine

## 2013-10-30 ENCOUNTER — Ambulatory Visit (INDEPENDENT_AMBULATORY_CARE_PROVIDER_SITE_OTHER): Payer: Medicare Other | Admitting: Internal Medicine

## 2013-10-30 VITALS — BP 115/69 | HR 89 | Temp 97.8°F | Ht 77.1 in | Wt 273.0 lb

## 2013-10-30 DIAGNOSIS — M1990 Unspecified osteoarthritis, unspecified site: Secondary | ICD-10-CM

## 2013-10-30 DIAGNOSIS — Z0000 Encounter for general adult medical examination without abnormal findings: Secondary | ICD-10-CM | POA: Diagnosis not present

## 2013-10-30 DIAGNOSIS — E785 Hyperlipidemia, unspecified: Secondary | ICD-10-CM | POA: Diagnosis not present

## 2013-10-30 DIAGNOSIS — E119 Type 2 diabetes mellitus without complications: Secondary | ICD-10-CM

## 2013-10-30 DIAGNOSIS — Z23 Encounter for immunization: Secondary | ICD-10-CM

## 2013-10-30 LAB — CBC WITH DIFFERENTIAL/PLATELET
Basophils Absolute: 0 10*3/uL (ref 0.0–0.1)
Basophils Relative: 0.5 % (ref 0.0–3.0)
Eosinophils Absolute: 0.3 10*3/uL (ref 0.0–0.7)
Eosinophils Relative: 3.9 % (ref 0.0–5.0)
HCT: 42.3 % (ref 39.0–52.0)
Hemoglobin: 14.4 g/dL (ref 13.0–17.0)
Lymphocytes Relative: 27.7 % (ref 12.0–46.0)
Lymphs Abs: 2.1 10*3/uL (ref 0.7–4.0)
MCHC: 34 g/dL (ref 30.0–36.0)
MCV: 93.5 fl (ref 78.0–100.0)
Monocytes Absolute: 0.6 10*3/uL (ref 0.1–1.0)
Monocytes Relative: 8.5 % (ref 3.0–12.0)
Neutro Abs: 4.5 10*3/uL (ref 1.4–7.7)
Neutrophils Relative %: 59.4 % (ref 43.0–77.0)
Platelets: 197 10*3/uL (ref 150.0–400.0)
RBC: 4.53 Mil/uL (ref 4.22–5.81)
RDW: 13.4 % (ref 11.5–14.6)
WBC: 7.6 10*3/uL (ref 4.5–10.5)

## 2013-10-30 LAB — AST: AST: 14 U/L (ref 0–37)

## 2013-10-30 LAB — HEMOGLOBIN A1C: Hgb A1c MFr Bld: 6.5 % (ref 4.6–6.5)

## 2013-10-30 LAB — ALT: ALT: 15 U/L (ref 0–53)

## 2013-10-30 NOTE — Assessment & Plan Note (Signed)
Continue with occasional pain in the shoulders right knee. Recommend to discuss with ortho. He already started  self physical therapy with some results

## 2013-10-30 NOTE — Patient Instructions (Signed)
Get your blood work before you leave   Next visit is for routine check up,  -- diabetes,  -- in 6 months  come back fasting Please make an appointment     Fall Prevention and [place] cause injuries and can affect all age groups. It is possible to use preventive measures to significantly decrease the likelihood of falls. There are many simple measures which can make your home safer and prevent falls. OUTDOORS  Repair cracks and edges of walkways and driveways.  Remove high doorway thresholds.  Trim shrubbery on the main path into your home.  Have good outside lighting.  Clear walkways of tools, rocks, debris, and clutter.  Check that handrails are not broken and are securely fastened. Both sides of steps should have handrails.  Have leaves, snow, and ice cleared regularly.  Use sand or salt on walkways during winter months.  In the garage, clean up grease or oil spills. BATHROOM  Install night lights.  Install grab bars by the toilet and in the tub and shower.  Use non-skid mats or decals in the tub or shower.  Place a plastic non-slip stool in the shower to sit on, if needed.  Keep floors dry and clean up all water on the floor immediately.  Remove soap buildup in the tub or shower on a regular basis.  Secure bath mats with non-slip, double-sided rug tape.  Remove throw rugs and tripping hazards from the floors. BEDROOMS  Install night lights.  Make sure a bedside light is easy to reach.  Do not use oversized bedding.  Keep a telephone by your bedside.  Have a firm chair with side arms to use for getting dressed.  Remove throw rugs and tripping hazards from the floor. KITCHEN  Keep handles on pots and pans turned toward the center of the stove. Use back burners when possible.  Clean up spills quickly and allow time for drying.  Avoid walking on wet floors.  Avoid hot utensils and knives.  Position shelves so they are not too high or  low.  Place commonly used objects within easy reach.  If necessary, use a sturdy step stool with a grab bar when reaching.  Keep electrical cables out of the way.  Do not use floor polish or wax that makes floors slippery. If you must use wax, use non-skid floor wax.  Remove throw rugs and tripping hazards from the floor. STAIRWAYS  Never leave objects on stairs.  Place handrails on both sides of stairways and use them. Fix any loose handrails. Make sure handrails on both sides of the stairways are as long as the stairs.  Check carpeting to make sure it is firmly attached along stairs. Make repairs to worn or loose carpet promptly.  Avoid placing throw rugs at the top or bottom of stairways, or properly secure the rug with carpet tape to prevent slippage. Get rid of throw rugs, if possible.  Have an electrician put in a light switch at the top and bottom of the stairs. OTHER FALL PREVENTION TIPS  Wear low-heel or rubber-soled shoes that are supportive and fit well. Wear closed toe shoes.  When using a stepladder, make sure it is fully opened and both spreaders are firmly locked. Do not climb a closed stepladder.  Add color or contrast paint or tape to grab bars and handrails in your home. Place contrasting color strips on first and last steps.  Learn and use mobility aids as needed. Install an Dealer  emergency response system.  Turn on lights to avoid dark areas. Replace light bulbs that burn out immediately. Get light switches that glow.  Arrange furniture to create clear pathways. Keep furniture in the same place.  Firmly attach carpet with non-skid or double-sided tape.  Eliminate uneven floor surfaces.  Select a carpet pattern that does not visually hide the edge of steps.  Be aware of all pets. OTHER HOME SAFETY TIPS  Set the water temperature for 120 F (48.8 C).  Keep emergency numbers on or near the telephone.  Keep smoke detectors on every level of the  home and near sleeping areas. Document Released: 10/02/2002 Document Revised: 04/12/2012 Document Reviewed: 01/01/2012 [facility] Patient Information 2014 [place].

## 2013-10-30 NOTE — Progress Notes (Signed)
Pre visit review using our clinic review tool, if applicable. No additional management support is needed unless otherwise documented below in the visit note. 

## 2013-10-30 NOTE — Assessment & Plan Note (Addendum)
Td 2013 Had a Flu shot  Zostavax 11-2012 Pneumonia shot - today  Prostate cancer screening--DRE and PSA normal 2013, repeat 2015 Had a colonoscopy at age 63, was recommended to repeat a colonoscopy at age 50. No records. Plan---> repeat colonoscopy at age 75.  Diet and exercise discussed

## 2013-10-30 NOTE — Progress Notes (Signed)
Subjective:    Patient ID: [name] [name], male    DOB: 02-24-1951, 63 y.o.   MRN: [id number]  HPI  Here for Medicare AWV:  1. Risk factors based on Past M, S, F history: reviewed 2. Physical Activities: not very active 3. Depression/mood: neg screening  4. Hearing:  No problemss noted or reported  5. ADL's:  Independent, drives  6. Fall Risk: no recent falls, prevention discussed  7. home Safety: does feel safe at home  8. Height, weight, & visual acuity: see VS, uses glasses , saw eye doctor 08-2013 9. Counseling: provided 10. Labs ordered based on risk factors: if needed  11. Referral Coordination: if needed 12. Care Plan, see assessment and plan  13. Cognitive Assessment: cognition skills appropriate and normal  In addition, today we discussed the following: Diabetes--good compliance with medications, at some point in the summer he was eating a lot of fruits (grapes)  and CBGs were as high as 153, otherwise CBGs in the low 100. High cholesterol--good compliance with Lipitor Hypertension--good compliance with medications, ambulatory BPs within normal. DJD--continue w/   pain at the shoulders and  right knee, on and off. Has started a self physical therapy program with some improvement.  Past Medical History  Diagnosis Date  . Diabetes mellitus   . Hyperlipidemia   . DJD (degenerative joint disease)   . Colon polyps     Colon polyps at age 8, next Cscope age 60   Past Surgical History  Procedure Laterality Date  . Nasal septum surgery  2000  . Shoulder arthroscopy      L 2011, R 2012   History   Social History  . Marital Status: Married    Spouse Name: N/A    Number of Children: 2  . Years of Education: N/A   Occupational History  . disability [employer]   Social History Main Topics  . Smoking status: Former Research scientist (life sciences)  . Smokeless tobacco: Never Used     Comment: quit ~ 2012, << 1 ppd  . Alcohol Use: Yes     Comment: socially   . Drug Use: No  . Sexual  Activity: Not on file   Other Topics Concern  . Not on file   Social History Narrative  . No narrative on file   Family History  Problem Relation Age of Onset  . Arthritis Mother   . Lung cancer Father     lung cancer  . Stroke Other   . Diabetes Other     uncle, brother (borderline)  . Colon cancer Neg Hx   . Prostate cancer Neg Hx   . CAD Neg Hx      Review of Systems No  CP, SOB, lower extremity edema Denies  nausea, vomiting diarrhea Denies  blood in the stools (-) cough, sputum production No dysuria, gross hematuria, difficulty urinating      Objective:   Physical Exam BP 115/69  Pulse 89  Temp(Src) 97.8 F (36.6 C)  Ht 6' 5.1" (1.958 m)  Wt 273 lb (123.832 kg)  BMI 32.30 kg/m2  SpO2 99% General -- alert, well-developed, NAD.  Neck --no thyromegaly , normal carotid pulse  HEENT-- Not pale.  Lungs -- normal respiratory effort, no intercostal retractions, no accessory muscle use, and normal breath sounds.  Heart-- normal rate, regular rhythm, no murmur.  Abdomen-- Not distended, good bowel sounds,soft, non-tender. No rebound or rigidity. No bruit Extremities-- no pretibial edema bilaterally  Neurologic--  alert & oriented X3.  Speech normal, gait normal, strength normal in all extremities.  Psych-- Cognition and judgment appear intact. Cooperative with normal attention span and concentration. No anxious or depressed appearing.      Assessment & Plan:

## 2013-10-30 NOTE — Assessment & Plan Note (Signed)
Well-controlled, continue with same medication

## 2013-10-30 NOTE — Assessment & Plan Note (Signed)
Well-controlled per last A1c. Diet and exercise discussed Saw the eye Dr. [phone] Labs

## 2013-12-18 DIAGNOSIS — L821 Other seborrheic keratosis: Secondary | ICD-10-CM | POA: Diagnosis not present

## 2013-12-18 DIAGNOSIS — L723 Sebaceous cyst: Secondary | ICD-10-CM | POA: Diagnosis not present

## 2013-12-18 DIAGNOSIS — D239 Other benign neoplasm of skin, unspecified: Secondary | ICD-10-CM | POA: Diagnosis not present

## 2013-12-18 DIAGNOSIS — L259 Unspecified contact dermatitis, unspecified cause: Secondary | ICD-10-CM | POA: Diagnosis not present

## 2013-12-18 DIAGNOSIS — B079 Viral wart, unspecified: Secondary | ICD-10-CM | POA: Diagnosis not present

## 2013-12-18 DIAGNOSIS — L219 Seborrheic dermatitis, unspecified: Secondary | ICD-10-CM | POA: Diagnosis not present

## 2013-12-18 DIAGNOSIS — L919 Hypertrophic disorder of the skin, unspecified: Secondary | ICD-10-CM | POA: Diagnosis not present

## 2013-12-18 DIAGNOSIS — L909 Atrophic disorder of skin, unspecified: Secondary | ICD-10-CM | POA: Diagnosis not present

## 2014-04-14 ENCOUNTER — Other Ambulatory Visit: Payer: Self-pay | Admitting: Internal Medicine

## 2014-04-26 ENCOUNTER — Other Ambulatory Visit: Payer: Self-pay | Admitting: Internal Medicine

## 2014-04-30 ENCOUNTER — Ambulatory Visit (INDEPENDENT_AMBULATORY_CARE_PROVIDER_SITE_OTHER): Payer: Medicare Other | Admitting: Internal Medicine

## 2014-04-30 ENCOUNTER — Other Ambulatory Visit: Payer: Self-pay | Admitting: Internal Medicine

## 2014-04-30 ENCOUNTER — Encounter: Payer: Self-pay | Admitting: Internal Medicine

## 2014-04-30 VITALS — BP 112/70 | HR 49 | Temp 97.7°F | Wt 274.2 lb

## 2014-04-30 DIAGNOSIS — I498 Other specified cardiac arrhythmias: Secondary | ICD-10-CM | POA: Diagnosis not present

## 2014-04-30 DIAGNOSIS — R001 Bradycardia, unspecified: Secondary | ICD-10-CM

## 2014-04-30 DIAGNOSIS — Z0000 Encounter for general adult medical examination without abnormal findings: Secondary | ICD-10-CM

## 2014-04-30 DIAGNOSIS — Z23 Encounter for immunization: Secondary | ICD-10-CM

## 2014-04-30 DIAGNOSIS — E139 Other specified diabetes mellitus without complications: Secondary | ICD-10-CM

## 2014-04-30 DIAGNOSIS — E119 Type 2 diabetes mellitus without complications: Secondary | ICD-10-CM

## 2014-04-30 LAB — BASIC METABOLIC PANEL
BUN: 15 mg/dL (ref 6–23)
CO2: 30 mEq/L (ref 19–32)
Calcium: 8.7 mg/dL (ref 8.4–10.5)
Chloride: 105 mEq/L (ref 96–112)
Creatinine, Ser: 1.2 mg/dL (ref 0.4–1.5)
GFR: 65.25 mL/min (ref >60.00)
Glucose, Bld: 129 mg/dL — ABNORMAL HIGH (ref 70–99)
Potassium: 3.9 mEq/L (ref 3.5–5.1)
Sodium: 140 mEq/L (ref 135–145)

## 2014-04-30 LAB — HEMOGLOBIN A1C: Hgb A1c MFr Bld: 6.5 % (ref 4.6–6.5)

## 2014-04-30 MED ORDER — METFORMIN HCL ER (MOD) 1000 MG PO TB24: ORAL_TABLET | ORAL | Status: DC

## 2014-04-30 NOTE — Assessment & Plan Note (Signed)
prevnar provided today Reminded about the flu shot this season

## 2014-04-30 NOTE — Assessment & Plan Note (Signed)
Seems to be doing well, due for a A1c and BMP. Will check labs otherwise continue with present care

## 2014-04-30 NOTE — Patient Instructions (Signed)
Get your blood work before you leave   Next visit is for a physical exam by 10-2014, fasting Please make an appointment

## 2014-04-30 NOTE — Progress Notes (Signed)
Subjective:    Patient ID: [name] [name], male    DOB: [birth year], 63 y.o.   MRN: [id number]  DOS:  04/30/2014 Type of visit - description: Routine visit History: Patient with history of diabetes, high cholesterol , feeling well,   good compliance with medications, no specific concerns.   ROS Denies chest pain or difficulty breathing No  nausea, vomiting, diarrhea. No lower extremity paresthesias  Past Medical History  Diagnosis Date  . Diabetes mellitus   . Hyperlipidemia   . DJD (degenerative joint disease)   . Colon polyps     Colon polyps at age 79, next Cscope age 80    Past Surgical History  Procedure Laterality Date  . Nasal septum surgery  2000  . Shoulder arthroscopy      L 2011, R 2012    History   Social History  . Marital Status: Married    Spouse Name: N/A    Number of Children: 2  . Years of Education: N/A   Occupational History  . disability [employer]   Social History Main Topics  . Smoking status: Former Research scientist (life sciences)  . Smokeless tobacco: Never Used     Comment: quit ~ 2012, << 1 ppd  . Alcohol Use: Yes     Comment: socially   . Drug Use: No  . Sexual Activity: Not on file   Other Topics Concern  . Not on file   Social History Narrative  . No narrative on file        Medication List       This list is accurate as of: 04/30/14 10:36 AM.  Always use your most recent med list.               aspirin 81 MG tablet  Take 81 mg by mouth daily.     atorvastatin 40 MG tablet  Commonly known as:  LIPITOR  Take 0.5 tablets (20 mg total) by mouth daily.     fish oil-omega-3 fatty acids 1000 MG capsule  Take 2 g by mouth daily.     freestyle lancets  Use as instructed     glucose blood test strip  Commonly known as:  FREESTYLE LITE  Use as instructed     IBUPROFEN PO  Take by mouth as needed.     lisinopril 2.5 MG tablet  Commonly known as:  PRINIVIL,ZESTRIL  TAKE 1 TABLET (2.5 MG TOTAL) BY MOUTH DAILY.     metFORMIN 1000 MG (MOD)  24 hr tablet  Commonly known as:  GLUMETZA  2 tabs a day     multivitamin tablet  Take 1 tablet by mouth daily.     mupirocin cream 2 %  Commonly known as:  BACTROBAN  Apply 1 application topically as needed.     sitaGLIPtin 100 MG tablet  Commonly known as:  JANUVIA  Take 1 tablet (100 mg total) by mouth daily.     vardenafil 20 MG tablet  Commonly known as:  LEVITRA  Take 1 tablet (20 mg total) by mouth daily as needed for erectile dysfunction.           Objective:   Physical Exam BP 112/70  Pulse 49  Temp(Src) 97.7 F (36.5 C) (Oral)  Wt 274 lb 3.2 oz (124.376 kg)  SpO2 100%  General -- alert, well-developed, NAD.   Lungs -- normal respiratory effort, no intercostal retractions, no accessory muscle use, and normal breath sounds.  Heart-- normal rate, regular rhythm, no murmur.  DIABETIC FEET EXAM: No lower extremity edema Normal pedal pulses bilaterally Skin normal, nails normal, no calluses Pinprick examination of the feet normal. Neurologic--  alert & oriented X3. Speech normal, gait appropriate for age, strength symmetric and appropriate for age.  Psych-- Cognition and judgment appear intact. Cooperative with normal attention span and concentration. No anxious or depressed appearing.         Assessment & Plan:

## 2014-04-30 NOTE — Progress Notes (Signed)
Pre visit review using our clinic review tool, if applicable. No additional management support is needed unless otherwise documented below in the visit note. 

## 2014-04-30 NOTE — Telephone Encounter (Signed)
[facility] [place]

## 2014-04-30 NOTE — Telephone Encounter (Signed)
done

## 2014-04-30 NOTE — Assessment & Plan Note (Signed)
Today he again showed some concern about the diagnoses, Patient is reassured, as long as he is asymptomatic no further testing is needed

## 2014-06-17 ENCOUNTER — Other Ambulatory Visit: Payer: Self-pay | Admitting: Internal Medicine

## 2014-07-04 ENCOUNTER — Encounter: Payer: Self-pay | Admitting: Internal Medicine

## 2014-07-09 ENCOUNTER — Other Ambulatory Visit: Payer: Self-pay | Admitting: Internal Medicine

## 2014-07-24 ENCOUNTER — Encounter: Payer: Self-pay | Admitting: Internal Medicine

## 2014-07-24 ENCOUNTER — Other Ambulatory Visit: Payer: Self-pay

## 2014-07-24 MED ORDER — METFORMIN HCL ER (MOD) 1000 MG PO TB24: ORAL_TABLET | ORAL | Status: DC

## 2014-07-26 ENCOUNTER — Other Ambulatory Visit: Payer: Self-pay

## 2014-07-26 DIAGNOSIS — E139 Other specified diabetes mellitus without complications: Secondary | ICD-10-CM

## 2014-07-26 MED ORDER — METFORMIN HCL ER 500 MG PO TB24: ORAL_TABLET | ORAL | Status: DC

## 2014-07-26 MED ORDER — METFORMIN HCL ER (OSM) 1000 MG PO TB24: ORAL_TABLET | ORAL | Status: DC

## 2014-07-27 ENCOUNTER — Encounter: Payer: Self-pay | Admitting: Internal Medicine

## 2014-07-27 DIAGNOSIS — Z23 Encounter for immunization: Secondary | ICD-10-CM | POA: Diagnosis not present

## 2014-09-05 DIAGNOSIS — K635 Polyp of colon: Secondary | ICD-10-CM | POA: Diagnosis not present

## 2014-09-05 DIAGNOSIS — K621 Rectal polyp: Secondary | ICD-10-CM | POA: Diagnosis not present

## 2014-09-05 DIAGNOSIS — D125 Benign neoplasm of sigmoid colon: Secondary | ICD-10-CM | POA: Diagnosis not present

## 2014-09-05 DIAGNOSIS — K648 Other hemorrhoids: Secondary | ICD-10-CM | POA: Diagnosis not present

## 2014-09-05 DIAGNOSIS — Z1211 Encounter for screening for malignant neoplasm of colon: Secondary | ICD-10-CM | POA: Diagnosis not present

## 2014-09-05 DIAGNOSIS — D124 Benign neoplasm of descending colon: Secondary | ICD-10-CM | POA: Diagnosis not present

## 2014-09-05 LAB — HM COLONOSCOPY

## 2014-09-11 DIAGNOSIS — E119 Type 2 diabetes mellitus without complications: Secondary | ICD-10-CM | POA: Diagnosis not present

## 2014-09-11 LAB — HM DIABETES EYE EXAM

## 2014-09-14 ENCOUNTER — Other Ambulatory Visit: Payer: Self-pay | Admitting: Internal Medicine

## 2014-10-31 ENCOUNTER — Encounter: Payer: Self-pay | Admitting: Internal Medicine

## 2014-10-31 ENCOUNTER — Ambulatory Visit (INDEPENDENT_AMBULATORY_CARE_PROVIDER_SITE_OTHER): Payer: Medicare Other | Admitting: Internal Medicine

## 2014-10-31 VITALS — BP 131/74 | HR 52 | Temp 97.8°F | Ht 76.0 in | Wt 279.4 lb

## 2014-10-31 DIAGNOSIS — E039 Hypothyroidism, unspecified: Secondary | ICD-10-CM | POA: Diagnosis not present

## 2014-10-31 DIAGNOSIS — E785 Hyperlipidemia, unspecified: Secondary | ICD-10-CM

## 2014-10-31 DIAGNOSIS — Z0000 Encounter for general adult medical examination without abnormal findings: Secondary | ICD-10-CM | POA: Diagnosis not present

## 2014-10-31 DIAGNOSIS — E119 Type 2 diabetes mellitus without complications: Secondary | ICD-10-CM | POA: Diagnosis not present

## 2014-10-31 DIAGNOSIS — Z125 Encounter for screening for malignant neoplasm of prostate: Secondary | ICD-10-CM

## 2014-10-31 LAB — LIPID PANEL
Cholesterol: 170 mg/dL (ref 0–200)
HDL: 54.9 mg/dL
LDL Cholesterol: 98 mg/dL (ref 0–99)
NonHDL: 115.1
Total CHOL/HDL Ratio: 3
Triglycerides: 85 mg/dL (ref 0.0–149.0)
VLDL: 17 mg/dL (ref 0.0–40.0)

## 2014-10-31 LAB — COMPREHENSIVE METABOLIC PANEL WITH GFR
ALT: 18 U/L (ref 0–53)
AST: 18 U/L (ref 0–37)
Albumin: 4.3 g/dL (ref 3.5–5.2)
Alkaline Phosphatase: 60 U/L (ref 39–117)
BUN: 19 mg/dL (ref 6–23)
CO2: 25 meq/L (ref 19–32)
Calcium: 9.4 mg/dL (ref 8.4–10.5)
Chloride: 104 meq/L (ref 96–112)
Creatinine, Ser: 1.2 mg/dL (ref 0.4–1.5)
GFR: 64.52 mL/min
Glucose, Bld: 132 mg/dL — ABNORMAL HIGH (ref 70–99)
Potassium: 4.2 meq/L (ref 3.5–5.1)
Sodium: 138 meq/L (ref 135–145)
Total Bilirubin: 0.9 mg/dL (ref 0.2–1.2)
Total Protein: 6.9 g/dL (ref 6.0–8.3)

## 2014-10-31 LAB — TSH: TSH: 16.99 u[IU]/mL — ABNORMAL HIGH (ref 0.35–4.50)

## 2014-10-31 LAB — PSA: PSA: 1.05 ng/mL (ref 0.10–4.00)

## 2014-10-31 LAB — ALT: ALT: 18 U/L (ref 0–53)

## 2014-10-31 LAB — HEMOGLOBIN A1C: Hgb A1c MFr Bld: 6.9 % — ABNORMAL HIGH (ref 4.6–6.5)

## 2014-10-31 LAB — AST: AST: 18 U/L (ref 0–37)

## 2014-10-31 MED ORDER — GLUCOSE BLOOD VI STRP: ORAL_STRIP | Status: DC

## 2014-10-31 MED ORDER — FREESTYLE LANCETS MISC: Status: DC

## 2014-10-31 NOTE — Assessment & Plan Note (Signed)
continue Januvia and metformin, check A1c Feet exam with no neuropathy Pt sees the eye doctor regularly

## 2014-10-31 NOTE — Patient Instructions (Signed)
Get your blood work before you leave    Please come back to the office in 6 months  for a routine check up  no fasting       Fall Prevention and Home Safety Falls cause injuries and can affect all age groups. It is possible to use preventive measures to significantly decrease the likelihood of falls. There are many simple measures which can make your home safer and prevent falls. OUTDOORS  Repair cracks and edges of walkways and driveways.  Remove high doorway thresholds.  Trim shrubbery on the main path into your home.  Have good outside lighting.  Clear walkways of tools, rocks, debris, and clutter.  Check that handrails are not broken and are securely fastened. Both sides of steps should have handrails.  Have leaves, snow, and ice cleared regularly.  Use sand or salt on walkways during winter months.  In the garage, clean up grease or oil spills. BATHROOM  Install night lights.  Install grab bars by the toilet and in the tub and shower.  Use non-skid mats or decals in the tub or shower.  Place a plastic non-slip stool in the shower to sit on, if needed.  Keep floors dry and clean up all water on the floor immediately.  Remove soap buildup in the tub or shower on a regular basis.  Secure bath mats with non-slip, double-sided rug tape.  Remove throw rugs and tripping hazards from the floors. BEDROOMS  Install night lights.  Make sure a bedside light is easy to reach.  Do not use oversized bedding.  Keep a telephone by your bedside.  Have a firm chair with side arms to use for getting dressed.  Remove throw rugs and tripping hazards from the floor. KITCHEN  Keep handles on pots and pans turned toward the center of the stove. Use back burners when possible.  Clean up spills quickly and allow time for drying.  Avoid walking on wet floors.  Avoid hot utensils and knives.  Position shelves so they are not too high or low.  Place commonly used objects  within easy reach.  If necessary, use a sturdy step stool with a grab bar when reaching.  Keep electrical cables out of the way.  Do not use floor polish or wax that makes floors slippery. If you must use wax, use non-skid floor wax.  Remove throw rugs and tripping hazards from the floor. STAIRWAYS  Never leave objects on stairs.  Place handrails on both sides of stairways and use them. Fix any loose handrails. Make sure handrails on both sides of the stairways are as long as the stairs.  Check carpeting to make sure it is firmly attached along stairs. Make repairs to worn or loose carpet promptly.  Avoid placing throw rugs at the top or bottom of stairways, or properly secure the rug with carpet tape to prevent slippage. Get rid of throw rugs, if possible.  Have an electrician put in a light switch at the top and bottom of the stairs. OTHER FALL PREVENTION TIPS  Wear low-heel or rubber-soled shoes that are supportive and fit well. Wear closed toe shoes.  When using a stepladder, make sure it is fully opened and both spreaders are firmly locked. Do not climb a closed stepladder.  Add color or contrast paint or tape to grab bars and handrails in your home. Place contrasting color strips on first and last steps.  Learn and use mobility aids as needed. Install an electrical emergency response  system.  Turn on lights to avoid dark areas. Replace light bulbs that burn out immediately. Get light switches that glow.  Arrange furniture to create clear pathways. Keep furniture in the same place.  Firmly attach carpet with non-skid or double-sided tape.  Eliminate uneven floor surfaces.  Select a carpet pattern that does not visually hide the edge of steps.  Be aware of all pets. OTHER HOME SAFETY TIPS  Set the water temperature for 120 F (48.8 C).  Keep emergency numbers on or near the telephone.  Keep smoke detectors on every level of the home and near sleeping  areas. Document Released: 10/02/2002 Document Revised: 04/12/2012 Document Reviewed: 01/01/2012 [facility] Patient Information 2015 [place], [place]. This information is not intended to replace advice given to you by your health care provider. Make sure you discuss any questions you have with your health care provider.    Preventive Care for Adults    Ages 34 and over  Blood pressure check.** / Every 1 to 2 years.  Lipid and cholesterol check.**/ Every 5 years beginning at age 42.  Lung cancer screening. / Every year if you are aged 3-80 years and have a 30-pack-year history of smoking and currently smoke or have quit within the past 15 years. Yearly screening is stopped once you have quit smoking for at least 15 years or develop a health problem that would prevent you from having lung cancer treatment.  Fecal occult blood test (FOBT) of stool. / Every year beginning at age 84 and continuing until age 15. You may not have to do this test if you get a colonoscopy every 10 years.  Flexible sigmoidoscopy** or colonoscopy.** / Every 5 years for a flexible sigmoidoscopy or every 10 years for a colonoscopy beginning at age 34 and continuing until age 60.  Hepatitis C blood test.** / For all people born from [birth year] through 1964 and any individual with known risks for hepatitis C.  Abdominal aortic aneurysm (AAA) screening.** / A one-time screening for ages 109 to 58 years who are current or former smokers.  Skin self-exam. / Monthly.  Influenza vaccine. / Every year.  Tetanus, diphtheria, and acellular pertussis (Tdap/Td) vaccine.** / 1 dose of Td every 10 years.  Varicella vaccine.** / Consult your health care provider.  Zoster vaccine.** / 1 dose for adults aged 57 years or older.  Pneumococcal 13-valent conjugate (PCV13) vaccine.** / Consult your health care provider.  Pneumococcal polysaccharide (PPSV23) vaccine.** / 1 dose for all adults aged 65 years and older.  Meningococcal  vaccine.** / Consult your health care provider.  Hepatitis A vaccine.** / Consult your health care provider.  Hepatitis B vaccine.** / Consult your health care provider.  Haemophilus influenzae type b (Hib) vaccine.** / Consult your health care provider. **Family history and personal history of risk and conditions may change your health care provider's recommendations. Document Released: 12/08/2001 Document Revised: 10/17/2013 Document Reviewed: 03/09/2011 [facility] Patient Information 2015 [place], [place]. This information is not intended to replace advice given to you by your health care provider. Make sure you discuss any questions you have with your health care provider.

## 2014-10-31 NOTE — Progress Notes (Signed)
Pre visit review using our clinic review tool, if applicable. No additional management support is needed unless otherwise documented below in the visit note. 

## 2014-10-31 NOTE — Assessment & Plan Note (Signed)
Continue with Lipitor, check labs

## 2014-10-31 NOTE — Progress Notes (Signed)
Subjective:    Patient ID: [name] [name], male    DOB: April 11, 1951, 64 y.o.   MRN: [id number]  DOS:  10/31/2014 Type of visit - description :   Here for Medicare AWV:  1. Risk factors based on Past M, S, F history: reviewed 2. Physical Activities: not very active, occ walks but has heel pain 3. Depression/mood: neg screening   4. Hearing:  No problemss noted or reported  , mild tinnitus x years  5. ADL's:  Independent, drives   6. Fall Risk: no recent falls, prevention discussed   7. home Safety: does feel safe at home   8. Height, weight, & visual acuity: see VS, uses glasses , sees eye doctor q year 9. Counseling: provided 10. Labs ordered based on risk factors: if needed   11. Referral Coordination: if needed 12. Care Plan, see assessment and plan   13. Cognitive Assessment: cognition skills appropriate and normal 14. Care team updated 15. Written plan provided  In addition, today we discussed the following: Diabetes, good medication compliance, no recent ambulatory CBGs, diet has not been the best during the holidays but is getting back to normal High cholesterol, good medication compliance, no apparent side effects Few weeks history of right heel pain, worse when he start walking, slightly better as he keeps going. Occasionally has pain at the tip of the toes.  ROS Denies chest pain or difficulty breathing No nausea, vomiting, diarrhea No cough, sputum production or wheezing No dysuria, gross hematuria or difficulty urinating  Past Medical History  Diagnosis Date  . Diabetes mellitus   . Hyperlipidemia   . DJD (degenerative joint disease)   . Colon polyps     Colon polyps at age 32, next Cscope age 63  . 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 Nasal septum surgery  2000  . Shoulder arthroscopy      L 2011, R 2012    History   Social History  . Marital Status: Married    Spouse Name: N/A    Number of Children: 2  . Years  of Education: N/A   Occupational History  . disability [employer]   Social History Main Topics  . Smoking status: Former Research scientist (life sciences)  . Smokeless tobacco: Never Used     Comment: quit ~ 2012, << 1 ppd  . Alcohol Use: Yes     Comment: socially   . Drug Use: No  . Sexual Activity: Not on file   Other Topics Concern  . Not on file   Social History Narrative   Household-- pt and wife         Medication List       This list is accurate as of: 10/31/14  7:31 PM.  Always use your most recent med list.               aspirin 81 MG tablet  Take 81 mg by mouth daily.     atorvastatin 40 MG tablet  Commonly known as:  LIPITOR  TAKE 0.5 TABLETS (20 MG TOTAL) BY MOUTH DAILY.     fish oil-omega-3 fatty acids 1000 MG capsule  Take 2 g by mouth daily.     freestyle lancets  Use as instructed     glucose blood test strip  Commonly known as:  FREESTYLE LITE  Use as instructed     IBUPROFEN PO  Take by mouth as needed.     JANUVIA 100 MG  tablet  Generic drug:  sitaGLIPtin  TAKE 1 TABLET BY MOUTH EVERY DAY     lisinopril 2.5 MG tablet  Commonly known as:  PRINIVIL,ZESTRIL  TAKE 1 TABLET (2.5 MG TOTAL) BY MOUTH DAILY.     metFORMIN 500 MG 24 hr tablet  Commonly known as:  GLUCOPHAGE XR  Take 1 tablet twice daily.     multivitamin tablet  Take 1 tablet by mouth daily.     mupirocin cream 2 %  Commonly known as:  BACTROBAN  Apply 1 application topically as needed.     vardenafil 20 MG tablet  Commonly known as:  LEVITRA  Take 1 tablet (20 mg total) by mouth daily as needed for erectile dysfunction.           Objective:   Physical Exam BP 131/74 mmHg  Pulse 52  Temp(Src) 97.8 F (36.6 C) (Oral)  Ht 6\' 4"  (1.93 m)  Wt 279 lb 6 oz (126.724 kg)  BMI 34.02 kg/m2  SpO2 100% General -- alert, well-developed, NAD.  Neck --no thyromegaly  HEENT-- Not pale.   Lungs -- normal respiratory effort, no intercostal retractions, no accessory muscle use, and normal breath  sounds.  Heart-- normal rate, regular rhythm, no murmur.  Abdomen-- Not distended, good bowel sounds,soft, non-tender. DIABETIC FEET EXAM: No lower extremity edema Normal pedal pulses bilaterally Skin normal, nails too long, no calluses Pinprick examination of the feet normal. Toes and right heel normal to inspection on palpation Rectal-- No external abnormalities noted. Normal sphincter tone. No rectal masses or tenderness. Stool not found Prostate--Prostate gland firm and smooth, no enlargement, nodularity, tenderness, mass, asymmetry or induration. Extremities-- no pretibial edema bilaterally  Neurologic--  alert & oriented X3. Speech normal, gait appropriate for age, strength symmetric and appropriate for age.  Psych-- Cognition and judgment appear intact. Cooperative with normal attention span and concentration. No anxious or depressed appearing.        Assessment & Plan:  Heel pain, likely plantar fasciitis, mended Occasional pain in the 2nd toe bilaterally, recommend use of wide shoes, keep nails short, see a podiatrist if not better

## 2014-10-31 NOTE — Assessment & Plan Note (Addendum)
Td 2013 Had a Flu shot  Zostavax 11-2012 Pneumonia shot - 2015  prevnar--2015  Prostate cancer screening--DRE normal , check a PSA Had a colonoscopy at age 64. No records.  Had another colonoscopy 08-2014, + polyps, repeat in 5 years. See report.   Diet and exercise discussed

## 2014-11-01 ENCOUNTER — Telehealth: Payer: Self-pay | Admitting: Internal Medicine

## 2014-11-01 NOTE — Addendum Note (Signed)
Addended by: Wilfrid Lund on: 11/01/2014 03:16 PM   Modules accepted: Orders

## 2014-11-01 NOTE — Telephone Encounter (Signed)
Caller name:Zara Hawthorne Relation to YS:AYTK Call back [phone] Pharmacy:  Reason for call: pt is returning your call regarding lab results

## 2014-11-01 NOTE — Addendum Note (Signed)
Addended by: Kathlene November E on: 11/01/2014 07:19 PM   Modules accepted: Miquel Dunn

## 2014-11-01 NOTE — Telephone Encounter (Signed)
Spoke with Pt, see lab result notes.

## 2014-11-02 ENCOUNTER — Other Ambulatory Visit (INDEPENDENT_AMBULATORY_CARE_PROVIDER_SITE_OTHER): Payer: Medicare Other

## 2014-11-02 DIAGNOSIS — E039 Hypothyroidism, unspecified: Secondary | ICD-10-CM

## 2014-11-02 LAB — T4, FREE: Free T4: 0.69 ng/dL (ref 0.60–1.60)

## 2014-11-02 LAB — T3, FREE: T3, Free: 2.7 pg/mL (ref 2.3–4.2)

## 2014-11-02 LAB — TSH: TSH: 10.48 u[IU]/mL — ABNORMAL HIGH (ref 0.35–4.50)

## 2014-11-05 ENCOUNTER — Telehealth: Payer: Self-pay | Admitting: Internal Medicine

## 2014-11-05 DIAGNOSIS — E039 Hypothyroidism, unspecified: Secondary | ICD-10-CM

## 2014-11-05 MED ORDER — LEVOTHYROXINE SODIUM 50 MCG PO TABS: 50.0000 ug | ORAL_TABLET | Freq: Every day | ORAL | Status: DC

## 2014-11-05 NOTE — Telephone Encounter (Signed)
Please advise 

## 2014-11-05 NOTE — Telephone Encounter (Signed)
Spoke with Pt, informed him of lab results. Instructed him to begin taking Synthroid 150 mcg 1 tablet daily (sent to [facility]), informed him to watch for side effects. Arrange TSH in 1 month, scheduled for 12/10/2014 at 2. TSH ordered.

## 2014-11-05 NOTE — Telephone Encounter (Signed)
Advise patient, labs confirm his thyroid is under working. Recommend to start Synthroid 50 mcg  1 tablet daily. #30, 2 RF Watch for side effects such as palpitations, nervousness, diarrhea. Arrange for a TSH one month. (Hypothyroidism

## 2014-11-05 NOTE — Telephone Encounter (Signed)
Caller name: Levi Relation to pt: self Call back number:   [phone] Pharmacy:  Reason for call:   Patient requesting lab results.

## 2014-11-27 ENCOUNTER — Encounter: Payer: Self-pay | Admitting: Internal Medicine

## 2014-11-28 ENCOUNTER — Telehealth: Payer: Self-pay

## 2014-11-28 DIAGNOSIS — M79671 Pain in right foot: Secondary | ICD-10-CM

## 2014-11-28 DIAGNOSIS — M79672 Pain in left foot: Secondary | ICD-10-CM

## 2014-11-28 NOTE — Telephone Encounter (Signed)
Received MyChart message from Pt regarding feet pain and requesting referral to a Podiatrist. Per Dr. Larose Kells, okay to send referral. Referral placed. Pt informed via MyChart from Dr. Larose Kells.

## 2014-12-02 ENCOUNTER — Telehealth: Payer: Self-pay | Admitting: Internal Medicine

## 2014-12-02 NOTE — Telephone Encounter (Signed)
Due for a TSH, dx  hypothyroidism. Please arrange

## 2014-12-03 ENCOUNTER — Encounter: Payer: Self-pay | Admitting: Podiatry

## 2014-12-03 ENCOUNTER — Ambulatory Visit (INDEPENDENT_AMBULATORY_CARE_PROVIDER_SITE_OTHER): Payer: Medicare Other | Admitting: Podiatry

## 2014-12-03 VITALS — BP 126/81 | HR 62 | Ht 76.0 in | Wt 279.0 lb

## 2014-12-03 DIAGNOSIS — M21619 Bunion of unspecified foot: Secondary | ICD-10-CM | POA: Insufficient documentation

## 2014-12-03 DIAGNOSIS — M2010 Hallux valgus (acquired), unspecified foot: Secondary | ICD-10-CM | POA: Diagnosis not present

## 2014-12-03 DIAGNOSIS — M722 Plantar fascial fibromatosis: Secondary | ICD-10-CM

## 2014-12-03 DIAGNOSIS — M216X9 Other acquired deformities of unspecified foot: Secondary | ICD-10-CM | POA: Diagnosis not present

## 2014-12-03 DIAGNOSIS — M21969 Unspecified acquired deformity of unspecified lower leg: Secondary | ICD-10-CM | POA: Diagnosis not present

## 2014-12-03 NOTE — Progress Notes (Signed)
Subjective: 64 year old presents complaining of toes are curling and walking on tip of toes on 2nd and 4th digit bilateral. Having pain in center ball of foot and plantar medial aspect of right heel. Currently he is not employed, disabled due to shoulder pain.  Stated that he has had diabetic condition for 15 years and blood sugar is under control.  Review of Systems - General ROS: negative for - chills, fever or night sweats Ophthalmic ROS: negative ENT ROS: negative Allergy and Immunology ROS: negative Hematological and Lymphatic ROS: negative Respiratory ROS: no cough, shortness of breath, or wheezing Cardiovascular ROS: no chest pain or dyspnea on exertion Gastrointestinal ROS: no abdominal pain, change in bowel habits, or black or bloody stools Genito-Urinary ROS: no dysuria, trouble voiding, or hematuria Musculoskeletal ROS: Shoulder problem and pain on going.  Neurological ROS: no TIA or stroke symptoms Dermatological ROS: Rashes on back. Been under dermatology care.   Objective: Vascular: All pedal pulses are palpable. No edema or erythema noted. Dermatologic: Normal skin without abnormal lesions.  Neurologic: All epicritic and tactile sensations grossly intact. Orthopedic: Tight Achilles tendon with knee flexed or extended bilateral. Unable to flex beyond 90 degrees. Minimum or no motion at the first MPJ with enlarged joint margins L>R. Pain with minimum extension at the first MPJ.   Mild digital contracture 2nd and 4th digit bilateral.  Severe elevated first ray with forefoot varus bilateral.   Assessment:  Plantar fasciitis right. Ankle equinus bilateral. Elevated first ray with forefoot varus bilateral. Dorsal bunion bilateral with pain. 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   Plan: Reviewed clinical findings and available treatment options. Reviewed stretch exercise for tight Achilles tendon on both  feet. Avoid flat heel shoes or barefoot.  May benefit from custom orthotics on plantar fascial and heel pain.  Patient will return for orthotics.

## 2014-12-03 NOTE — Telephone Encounter (Signed)
thx

## 2014-12-03 NOTE — Patient Instructions (Signed)
Seen for pain in right foot. Noted of tight Achilles tendon on both feet, displaced first metatarsal bone with arthritic change in the first big joint both feet. Do Achilles tendon stretch exercise daily. Avoid flat heel shoes or barefoot.  May benefit from custom orthotics.  Return for orthotics.

## 2014-12-03 NOTE — Telephone Encounter (Signed)
Pt has lab appt scheduled for TSH on 12/10/2014 @ 1400.

## 2014-12-05 ENCOUNTER — Encounter: Payer: Self-pay | Admitting: Podiatry

## 2014-12-05 ENCOUNTER — Ambulatory Visit (INDEPENDENT_AMBULATORY_CARE_PROVIDER_SITE_OTHER): Payer: Medicare Other | Admitting: Podiatry

## 2014-12-05 DIAGNOSIS — M216X9 Other acquired deformities of unspecified foot: Secondary | ICD-10-CM

## 2014-12-05 DIAGNOSIS — M79673 Pain in unspecified foot: Secondary | ICD-10-CM

## 2014-12-05 DIAGNOSIS — M722 Plantar fascial fibromatosis: Secondary | ICD-10-CM

## 2014-12-05 DIAGNOSIS — M21969 Unspecified acquired deformity of unspecified lower leg: Secondary | ICD-10-CM

## 2014-12-05 NOTE — Progress Notes (Signed)
Subjective: 64 year old male presents to have custom orthotics prepared. Stated that he has been doing the stretch exercise for tight Achilles tendon.   Objective: Vascular: All pedal pulses are palpable. No edema or erythema noted. Dermatologic: Normal skin without abnormal lesions.  Neurologic: All epicritic and tactile sensations grossly intact. Orthopedic: Tight Achilles tendon with knee flexed or extended bilateral. Unable to flex beyond 90 degrees. Minimum or no motion at the first MPJ with enlarged joint margins L>R. Pain with minimum extension at the first MPJ.  Mild digital contracture 2nd and 4th digit bilateral.  Severe elevated first ray with forefoot varus bilateral.   Assessment:  Plantar fasciitis right. Ankle equinus bilateral. Elevated first ray with forefoot varus bilateral. Dorsal bunion bilateral with pain. 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   Plan: Reviewed clinical findings and available treatment options, orthotics, biplane Austin procedure to plantar flex the first ray. Continue with stretch exercise for tight Achilles tendon on both feet. Both feet casted for orthotics.

## 2014-12-10 ENCOUNTER — Other Ambulatory Visit (INDEPENDENT_AMBULATORY_CARE_PROVIDER_SITE_OTHER): Payer: Medicare Other

## 2014-12-10 DIAGNOSIS — E039 Hypothyroidism, unspecified: Secondary | ICD-10-CM | POA: Diagnosis not present

## 2014-12-11 LAB — TSH: TSH: 2.638 u[IU]/mL (ref 0.350–4.500)

## 2014-12-12 ENCOUNTER — Other Ambulatory Visit: Payer: Self-pay | Admitting: Internal Medicine

## 2014-12-12 ENCOUNTER — Ambulatory Visit (INDEPENDENT_AMBULATORY_CARE_PROVIDER_SITE_OTHER): Payer: Medicare Other | Admitting: Podiatry

## 2014-12-12 ENCOUNTER — Encounter: Payer: Self-pay | Admitting: Podiatry

## 2014-12-12 VITALS — BP 146/79 | HR 55 | Ht 76.0 in | Wt 279.0 lb

## 2014-12-12 DIAGNOSIS — M2010 Hallux valgus (acquired), unspecified foot: Secondary | ICD-10-CM

## 2014-12-12 DIAGNOSIS — M21969 Unspecified acquired deformity of unspecified lower leg: Secondary | ICD-10-CM

## 2014-12-12 DIAGNOSIS — M79671 Pain in right foot: Secondary | ICD-10-CM

## 2014-12-12 DIAGNOSIS — G8929 Other chronic pain: Secondary | ICD-10-CM

## 2014-12-12 NOTE — Progress Notes (Signed)
Subjective: 64 year old male presents requesting surgical intervention on painful right foot. Big toe joint doesn't move and outer heel hurts to ambulate.  Stated that he has been doing the stretch exercise for tight Achilles tendon.   Objective: Vascular: All pedal pulses are palpable. No edema or erythema noted. Dermatologic: Normal skin without abnormal lesions.  Neurologic: All epicritic and tactile sensations grossly intact. Orthopedic: Tight Achilles tendon with knee flexed or extended bilateral. Unable to flex beyond 90 degrees. Minimum or no motion at the first MPJ with enlarged joint margins L>R. Pain with minimum extension at the first MPJ.  Mild digital contracture 2nd and 4th digit bilateral.  Severe elevated first ray with forefoot varus bilateral.   Assessment:  Plantar fasciitis right. Ankle equinus bilateral. Elevated first ray with forefoot varus bilateral. Dorsal bunion bilateral with pain. 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   Plan: Reviewed surgical procedure biplane Austin procedure to plantar flex the first ray. Continue with stretch exercise for tight Achilles tendon on both feet.

## 2014-12-12 NOTE — Patient Instructions (Signed)
Reviewed surgical procedure on right foot, remove bunion, shorten the first metatarsal, and increase mobility of the first metatarsophalangeal joint of right foot. All questions answered.

## 2014-12-17 DIAGNOSIS — L918 Other hypertrophic disorders of the skin: Secondary | ICD-10-CM | POA: Diagnosis not present

## 2014-12-17 DIAGNOSIS — D225 Melanocytic nevi of trunk: Secondary | ICD-10-CM | POA: Diagnosis not present

## 2014-12-17 DIAGNOSIS — L821 Other seborrheic keratosis: Secondary | ICD-10-CM | POA: Diagnosis not present

## 2014-12-17 DIAGNOSIS — B078 Other viral warts: Secondary | ICD-10-CM | POA: Diagnosis not present

## 2014-12-17 DIAGNOSIS — D2371 Other benign neoplasm of skin of right lower limb, including hip: Secondary | ICD-10-CM | POA: Diagnosis not present

## 2014-12-17 DIAGNOSIS — L738 Other specified follicular disorders: Secondary | ICD-10-CM | POA: Diagnosis not present

## 2014-12-17 DIAGNOSIS — L308 Other specified dermatitis: Secondary | ICD-10-CM | POA: Diagnosis not present

## 2014-12-17 DIAGNOSIS — D2361 Other benign neoplasm of skin of right upper limb, including shoulder: Secondary | ICD-10-CM | POA: Diagnosis not present

## 2014-12-17 DIAGNOSIS — L720 Epidermal cyst: Secondary | ICD-10-CM | POA: Diagnosis not present

## 2014-12-25 ENCOUNTER — Encounter: Payer: Self-pay | Admitting: Internal Medicine

## 2014-12-25 ENCOUNTER — Other Ambulatory Visit: Payer: Self-pay

## 2014-12-25 MED ORDER — LEVOTHYROXINE SODIUM 50 MCG PO TABS: 50.0000 ug | ORAL_TABLET | Freq: Every day | ORAL | Status: DC

## 2014-12-27 DIAGNOSIS — M2011 Hallux valgus (acquired), right foot: Secondary | ICD-10-CM | POA: Diagnosis not present

## 2014-12-27 HISTORY — PX: BUNIONECTOMY: SHX129

## 2015-01-01 ENCOUNTER — Ambulatory Visit (INDEPENDENT_AMBULATORY_CARE_PROVIDER_SITE_OTHER): Payer: Medicare Other | Admitting: Podiatry

## 2015-01-01 ENCOUNTER — Encounter: Payer: Self-pay | Admitting: Podiatry

## 2015-01-01 VITALS — BP 140/77 | HR 60

## 2015-01-01 DIAGNOSIS — M21612 Bunion of left foot: Secondary | ICD-10-CM

## 2015-01-01 DIAGNOSIS — M2012 Hallux valgus (acquired), left foot: Secondary | ICD-10-CM

## 2015-01-01 NOTE — Progress Notes (Signed)
5 days post op following biplane austin bunionectomy right foot.  Experiencing minimum discomfort. Denies any fever or chills. Denies any pain.  Dressing changed. Return in one week.

## 2015-01-01 NOTE — Patient Instructions (Signed)
5 days post op wound doing well. Continue with semi weight bearing and minimum ambulation. Return in one week.

## 2015-01-08 ENCOUNTER — Ambulatory Visit (INDEPENDENT_AMBULATORY_CARE_PROVIDER_SITE_OTHER): Payer: Medicare Other | Admitting: Podiatry

## 2015-01-08 ENCOUNTER — Encounter: Payer: Self-pay | Admitting: Podiatry

## 2015-01-08 DIAGNOSIS — M21611 Bunion of right foot: Secondary | ICD-10-CM

## 2015-01-08 DIAGNOSIS — M2011 Hallux valgus (acquired), right foot: Secondary | ICD-10-CM | POA: Diagnosis not present

## 2015-01-08 NOTE — Progress Notes (Signed)
10 days post op following biplane austin bunionectomy right foot.  Experiencing minimum discomfort. Been doing toe exercise as instructed. Denies any fever or chills. Denies any pain.  Sutures removed. Placed in Ace bandage and surgical shoe.  Return in 2 weeks.

## 2015-01-08 NOTE — Patient Instructions (Signed)
10 days post op wound doing well. Suture removed. Stay in surgical shoe for ambulation. Return in 2 weeks.

## 2015-01-22 ENCOUNTER — Encounter: Payer: Self-pay | Admitting: Podiatry

## 2015-01-22 ENCOUNTER — Ambulatory Visit (INDEPENDENT_AMBULATORY_CARE_PROVIDER_SITE_OTHER): Payer: Medicare Other | Admitting: Podiatry

## 2015-01-22 DIAGNOSIS — Z9889 Other specified postprocedural states: Secondary | ICD-10-CM | POA: Insufficient documentation

## 2015-01-22 NOTE — Patient Instructions (Signed)
4 weeks post op wound doing well. Continue do stretch exercise. Return in 3 weeks.

## 2015-01-22 NOTE — Progress Notes (Signed)
Subjective: 4 weeks post op. Doing well. No edema or erythema. Skin healed well.   Objective:  Improved range of motion first MPJ right post surgical.  Plan: Ok to try tennis shoes.  Return in 3 weeks for follow up.

## 2015-01-24 ENCOUNTER — Encounter: Payer: Self-pay | Admitting: Internal Medicine

## 2015-01-24 ENCOUNTER — Other Ambulatory Visit: Payer: Self-pay

## 2015-01-24 MED ORDER — METFORMIN HCL ER 500 MG PO TB24: 500.0000 mg | ORAL_TABLET | Freq: Two times a day (BID) | ORAL | Status: DC

## 2015-02-12 ENCOUNTER — Ambulatory Visit (INDEPENDENT_AMBULATORY_CARE_PROVIDER_SITE_OTHER): Payer: Medicare Other | Admitting: Podiatry

## 2015-02-12 ENCOUNTER — Encounter: Payer: Self-pay | Admitting: Podiatry

## 2015-02-12 DIAGNOSIS — Z9889 Other specified postprocedural states: Secondary | ICD-10-CM

## 2015-02-12 NOTE — Patient Instructions (Signed)
7 weeks post op right following bunionectomy. Good range of motion. May resume regular activity. Return as needed.

## 2015-02-12 NOTE — Progress Notes (Signed)
7 weeks post op with minimum discomfort. Able to wear regular tennis shoes without difficulty.  Has mild edema at times and some twinge pain along the incision but not constant. Acceptable range of motion(35 dorsiflexion/15 plantar flexion) first MPJ for normal ambulation.  Satisfactory recovery with full functional restoration of the first MPJ.  May return to regular activity.  Patient wishes to return this summer to have the left foot surgery.

## 2015-03-16 ENCOUNTER — Other Ambulatory Visit: Payer: Self-pay | Admitting: Internal Medicine

## 2015-03-21 DIAGNOSIS — H35372 Puckering of macula, left eye: Secondary | ICD-10-CM | POA: Diagnosis not present

## 2015-03-29 ENCOUNTER — Ambulatory Visit (INDEPENDENT_AMBULATORY_CARE_PROVIDER_SITE_OTHER): Payer: Medicare Other | Admitting: Podiatry

## 2015-03-29 ENCOUNTER — Encounter: Payer: Self-pay | Admitting: Podiatry

## 2015-03-29 VITALS — BP 162/82 | HR 54

## 2015-03-29 DIAGNOSIS — D3613 Benign neoplasm of peripheral nerves and autonomic nervous system of lower limb, including hip: Secondary | ICD-10-CM

## 2015-03-29 DIAGNOSIS — M21969 Unspecified acquired deformity of unspecified lower leg: Secondary | ICD-10-CM | POA: Diagnosis not present

## 2015-03-29 NOTE — Progress Notes (Signed)
Subjective: 64 year old male presents complaining of pain at the 2nd and 3rd MPJ area right x one month.  Status post right biplane austin bunionectomy right. Having no problem since the surgery.  Objective: Improved first MPJ motion right with mild numbness at dorsomedial aspect of the hallux. Pain at the 2nd intermetatarsal space with squeeze test.  Positive of elevated first ray right.  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  Plan: Reviewed findings and available options. Injected 2nd intermetatarsal space right with mixture of 4 mg Dexamethasone, 4 mg Triamcinolone, and 1 cc of 0.5% Marcaine plain. Patient tolerated well without difficulty.  Metatarsal pad placed over the right orthotic. Patient will bring the orthotic for permanent mounting if pad is helping.

## 2015-03-29 NOTE — Patient Instructions (Signed)
Seen for pain in right 2nd and 3rd metatarsal joint area. Possible injured nerve. Metatarsal pad placed in orthotics. Try for a couple of weeks. If helped, will need permanent padding. Injection given. Return as needed.

## 2015-04-12 ENCOUNTER — Ambulatory Visit (INDEPENDENT_AMBULATORY_CARE_PROVIDER_SITE_OTHER): Payer: Medicare Other | Admitting: Podiatry

## 2015-04-12 ENCOUNTER — Encounter: Payer: Self-pay | Admitting: Podiatry

## 2015-04-12 DIAGNOSIS — D3613 Benign neoplasm of peripheral nerves and autonomic nervous system of lower limb, including hip: Secondary | ICD-10-CM

## 2015-04-12 NOTE — Progress Notes (Signed)
Doing well following injection and metatarsal pad on orthotics. Will wait another week and send them off for adjustment.

## 2015-04-12 NOTE — Patient Instructions (Signed)
Doing well with metatarsal pad. Return with orthotics to adjust the orthotics.

## 2015-04-22 ENCOUNTER — Other Ambulatory Visit: Payer: Self-pay

## 2015-05-01 ENCOUNTER — Encounter: Payer: Self-pay | Admitting: Internal Medicine

## 2015-05-01 ENCOUNTER — Ambulatory Visit (INDEPENDENT_AMBULATORY_CARE_PROVIDER_SITE_OTHER): Payer: Medicare Other | Admitting: Internal Medicine

## 2015-05-01 VITALS — BP 118/64 | HR 46 | Temp 97.5°F | Ht 76.0 in | Wt 284.0 lb

## 2015-05-01 DIAGNOSIS — E119 Type 2 diabetes mellitus without complications: Secondary | ICD-10-CM | POA: Diagnosis not present

## 2015-05-01 DIAGNOSIS — E039 Hypothyroidism, unspecified: Secondary | ICD-10-CM

## 2015-05-01 LAB — TSH: TSH: 4.43 u[IU]/mL (ref 0.35–4.50)

## 2015-05-01 LAB — HEMOGLOBIN A1C: Hgb A1c MFr Bld: 7.2 % — ABNORMAL HIGH (ref 4.6–6.5)

## 2015-05-01 MED ORDER — ATORVASTATIN CALCIUM 40 MG PO TABS: 20.0000 mg | ORAL_TABLET | Freq: Every day | ORAL | Status: DC

## 2015-05-01 NOTE — Patient Instructions (Signed)
Get your blood work before you leave   Diabetes: Check your blood sugar  once a day      Check your blood sugar  at different times of the day  GOALS: Fasting before a meal 70- 130 2 hours after a meal less than 180 At bedtime 90-150 Call if consistently not at goal

## 2015-05-01 NOTE — Assessment & Plan Note (Signed)
Good compliance of medication, ambulatory CBGs in the 130s We'll check A1c Continue metformin and Januvia Check CBGs once a day, goals discussed

## 2015-05-01 NOTE — Assessment & Plan Note (Signed)
DX 10-2014 base on a TSH of 16, currently on Synthroid, check labs

## 2015-05-01 NOTE — Progress Notes (Signed)
Subjective:    Patient ID: [name] [name], male    DOB: [birth year], 64 y.o.   MRN: [id number]  DOS:  05/01/2015 Type of visit - description : Routine visit Interval history: Diabetes, good compliance of medication, CBGs around 130, higher than usual, he has not been able to exercise as much as before Hypothyroidism, good compliance of medication, due for a TSH Feet pain, working with his podiatrist, still having some pain, unable to exercise much   Review of Systems  No chest pain or difficulty breathing No nausea, vomiting, diarrhea No cough   Past Medical History  Diagnosis Date  . Diabetes mellitus   . Hyperlipidemia   . DJD (degenerative joint disease)   . Colon polyps     Colon polyps at age 66, next Cscope age 75  . 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 Nasal septum surgery  2000  . Shoulder arthroscopy      L 2011, R 2012  . Bunionectomy Right 12/27/2014    @[facility]    History   Social History  . Marital Status: Married    Spouse Name: N/A  . Number of Children: 2  . Years of Education: N/A   Occupational History  . disability [employer]   Social History Main Topics  . Smoking status: Former Research scientist (life sciences)  . Smokeless tobacco: Never Used     Comment: quit ~ 2012, << 1 ppd  . Alcohol Use: 0.0 oz/week    0 Standard drinks or equivalent per week     Comment: socially   . Drug Use: No  . Sexual Activity: Not on file   Other Topics Concern  . Not on file   Social History Narrative   Household-- pt and wife         Medication List       This list is accurate as of: 05/01/15  5:26 PM.  Always use your most recent med list.               aspirin 81 MG tablet  Take 81 mg by mouth daily.     atorvastatin 40 MG tablet  Commonly known as:  LIPITOR  Take 0.5 tablets (20 mg total) by mouth daily.     fish oil-omega-3 fatty acids 1000 MG capsule  Take 2 g by mouth daily.     freestyle lancets    Use as instructed     glucose blood test strip  Commonly known as:  FREESTYLE LITE  Use as instructed     HYDROcodone-acetaminophen 7.5-325 MG per tablet  Commonly known as:  NORCO     IBUPROFEN PO  Take by mouth as needed.     levothyroxine 50 MCG tablet  Commonly known as:  SYNTHROID, LEVOTHROID  Take 1 tablet (50 mcg total) by mouth daily.     lisinopril 2.5 MG tablet  Commonly known as:  PRINIVIL,ZESTRIL  TAKE 1 TABLET (2.5 MG TOTAL) BY MOUTH DAILY.     metFORMIN 500 MG 24 hr tablet  Commonly known as:  GLUCOPHAGE XR  Take 1 tablet (500 mg total) by mouth 2 (two) times daily with a meal.     multivitamin tablet  Take 1 tablet by mouth daily.     mupirocin cream 2 %  Commonly known as:  BACTROBAN  Apply 1 application topically as needed.     sitaGLIPtin 100 MG tablet  Commonly known as:  JANUVIA  Take  1 tablet (100 mg total) by mouth daily.     triamcinolone cream 0.1 %  Commonly known as:  KENALOG     vardenafil 20 MG tablet  Commonly known as:  LEVITRA  Take 1 tablet (20 mg total) by mouth daily as needed for erectile dysfunction.           Objective:   Physical Exam BP 118/64 mmHg  Pulse 46  Temp(Src) 97.5 F (36.4 C) (Oral)  Ht 6\' 4"  (1.93 m)  Wt 284 lb (128.822 kg)  BMI 34.58 kg/m2  SpO2 99% General:   Well developed, well nourished . NAD.  HEENT:  Normocephalic . Face symmetric, atraumatic Lungs:  CTA B Normal respiratory effort, no intercostal retractions, no accessory muscle use. Heart: RRR,  no murmur.  No pretibial edema bilaterally  Skin: Not pale. Not jaundice Neurologic:  alert & oriented X3.  Speech normal, gait appropriate for age and unassisted Psych--  Cognition and judgment appear intact.  Cooperative with normal attention span and concentration.  Behavior appropriate. No anxious or depressed appearing.      Assessment & Plan:

## 2015-05-01 NOTE — Progress Notes (Signed)
Pre visit review using our clinic review tool, if applicable. No additional management support is needed unless otherwise documented below in the visit note. 

## 2015-05-03 MED ORDER — LEVOTHYROXINE SODIUM 75 MCG PO TABS: 75.0000 ug | ORAL_TABLET | Freq: Every day | ORAL | Status: DC

## 2015-05-03 MED ORDER — METFORMIN HCL ER 500 MG PO TB24: 1000.0000 mg | ORAL_TABLET | Freq: Two times a day (BID) | ORAL | Status: DC

## 2015-05-03 NOTE — Addendum Note (Signed)
Addended by: Wilfrid Lund on: 05/03/2015 09:11 AM   Modules accepted: Orders, Medications

## 2015-05-03 NOTE — Addendum Note (Signed)
Addended by: Wilfrid Lund on: 05/03/2015 10:46 AM   Modules accepted: Orders

## 2015-07-26 ENCOUNTER — Encounter: Payer: Self-pay | Admitting: Internal Medicine

## 2015-08-01 ENCOUNTER — Ambulatory Visit: Payer: Medicare Other | Admitting: Internal Medicine

## 2015-08-07 ENCOUNTER — Ambulatory Visit (INDEPENDENT_AMBULATORY_CARE_PROVIDER_SITE_OTHER): Payer: Medicare Other | Admitting: Internal Medicine

## 2015-08-07 ENCOUNTER — Encounter: Payer: Self-pay | Admitting: Internal Medicine

## 2015-08-07 VITALS — BP 122/76 | HR 54 | Temp 97.6°F | Ht 76.0 in | Wt 275.0 lb

## 2015-08-07 DIAGNOSIS — Z23 Encounter for immunization: Secondary | ICD-10-CM | POA: Diagnosis not present

## 2015-08-07 DIAGNOSIS — E785 Hyperlipidemia, unspecified: Secondary | ICD-10-CM | POA: Diagnosis not present

## 2015-08-07 DIAGNOSIS — Z1159 Encounter for screening for other viral diseases: Secondary | ICD-10-CM

## 2015-08-07 DIAGNOSIS — E119 Type 2 diabetes mellitus without complications: Secondary | ICD-10-CM

## 2015-08-07 DIAGNOSIS — Z114 Encounter for screening for human immunodeficiency virus [HIV]: Secondary | ICD-10-CM

## 2015-08-07 DIAGNOSIS — E039 Hypothyroidism, unspecified: Secondary | ICD-10-CM | POA: Diagnosis not present

## 2015-08-07 DIAGNOSIS — Z09 Encounter for follow-up examination after completed treatment for conditions other than malignant neoplasm: Secondary | ICD-10-CM | POA: Insufficient documentation

## 2015-08-07 LAB — LIPID PANEL
Cholesterol: 144 mg/dL (ref 0–200)
HDL: 50.2 mg/dL (ref >39.00)
LDL Cholesterol: 75 mg/dL (ref 0–99)
NonHDL: 93.46
Total CHOL/HDL Ratio: 3
Triglycerides: 91 mg/dL (ref 0.0–149.0)
VLDL: 18.2 mg/dL (ref 0.0–40.0)

## 2015-08-07 LAB — BASIC METABOLIC PANEL
BUN: 17 mg/dL (ref 6–23)
CO2: 27 mEq/L (ref 19–32)
Calcium: 9.7 mg/dL (ref 8.4–10.5)
Chloride: 102 mEq/L (ref 96–112)
Creatinine, Ser: 1.18 mg/dL (ref 0.40–1.50)
GFR: 66.26 mL/min (ref >60.00)
Glucose, Bld: 112 mg/dL — ABNORMAL HIGH (ref 70–99)
Potassium: 4.2 mEq/L (ref 3.5–5.1)
Sodium: 138 mEq/L (ref 135–145)

## 2015-08-07 LAB — HEMOGLOBIN A1C: Hgb A1c MFr Bld: 6.7 % — ABNORMAL HIGH (ref 4.6–6.5)

## 2015-08-07 LAB — AST: AST: 14 U/L (ref 0–37)

## 2015-08-07 LAB — ALT: ALT: 20 U/L (ref 0–53)

## 2015-08-07 LAB — TSH: TSH: 0.38 u[IU]/mL (ref 0.35–4.50)

## 2015-08-07 MED ORDER — GLUCOSE BLOOD VI STRP
ORAL_STRIP | Status: DC
Start: 2015-08-07 — End: 2015-12-02

## 2015-08-07 MED ORDER — FREESTYLE LANCETS MISC
Status: DC
Start: 2015-08-07 — End: 2015-12-02

## 2015-08-07 NOTE — Progress Notes (Signed)
Pre visit review using our clinic review tool, if applicable. No additional management support is needed unless otherwise documented below in the visit note. 

## 2015-08-07 NOTE — Assessment & Plan Note (Signed)
Diabetes: Last A1c 7.2, metformin dose was increased, good compliance, tolerance. Check a BMP, A1c. Refill diabetic supplies Hypothyroidism: Synthroid dose adjusted the last time, check a TSH High cholesterol: Good compliance with Lipitor, check FLP Primary care: Flu shot, hep C, HIV testing. RTC January 2017 for a physical exam

## 2015-08-07 NOTE — Patient Instructions (Signed)
Get your blood work before you leave   For your next appointment in January you don't have to be fasting if you don't like to.

## 2015-08-07 NOTE — Progress Notes (Signed)
Subjective:    Patient ID: [name] [name], male    DOB: August 04, 1951, 64 y.o.   MRN: [id number]  DOS:  08/07/2015 Type of visit - description : Routine visit Interval history:  DM: Base on last A1c, medication was adjusted, good compliance. CBGs are checked from time to time, this morning fasting was 97. Only one time cbg has been > 140. Hypothyroidism: Thyroid medication was adjusted, due for labs High cholesterol: Good compliance with the statins, fasting today. Labs.   Review of Systems Exercise: Has improved a little, recovering from foot surgery, slightly more active. Diet, mild improvement Denies chest pain or difficulty breathing No nausea, vomiting, diarrhea. No lower extremity paresthesias except for some numbness in his foot after  surgery  Past Medical History  Diagnosis Date  . Diabetes mellitus   . Hyperlipidemia   . DJD (degenerative joint disease)   . Colon polyps     Colon polyps at age 23, next Cscope age 69  . 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 Nasal septum surgery  2000  . Shoulder arthroscopy      L 2011, R 2012  . Bunionectomy Right 12/27/2014    @[facility]    Social History   Social History  . Marital Status: Married    Spouse Name: N/A  . Number of Children: 2  . Years of Education: N/A   Occupational History  . disability [employer]   Social History Main Topics  . Smoking status: Former Research scientist (life sciences)  . Smokeless tobacco: Never Used     Comment: quit ~ 2012, << 1 ppd  . Alcohol Use: 0.0 oz/week    0 Standard drinks or equivalent per week     Comment: socially   . Drug Use: No  . Sexual Activity: Not on file   Other Topics Concern  . Not on file   Social History Narrative   Household-- pt and wife         Medication List       This list is accurate as of: 08/07/15  5:49 PM.  Always use your most recent med list.               aspirin 81 MG tablet  Take 81 mg by mouth  daily.     atorvastatin 40 MG tablet  Commonly known as:  LIPITOR  Take 0.5 tablets (20 mg total) by mouth daily.     fish oil-omega-3 fatty acids 1000 MG capsule  Take 2 g by mouth daily.     freestyle lancets  Check once daily     glucose blood test strip  Commonly known as:  FREESTYLE LITE  Check once daily     HYDROcodone-acetaminophen 7.5-325 MG tablet  Commonly known as:  NORCO     IBUPROFEN PO  Take by mouth as needed.     levothyroxine 75 MCG tablet  Commonly known as:  SYNTHROID, LEVOTHROID  Take 1 tablet (75 mcg total) by mouth daily before breakfast.     lisinopril 2.5 MG tablet  Commonly known as:  PRINIVIL,ZESTRIL  TAKE 1 TABLET (2.5 MG TOTAL) BY MOUTH DAILY.     metFORMIN 500 MG 24 hr tablet  Commonly known as:  GLUCOPHAGE XR  Take 2 tablets (1,000 mg total) by mouth 2 (two) times daily with a meal.     multivitamin tablet  Take 1 tablet by mouth daily.     mupirocin cream 2 %  Commonly known as:  BACTROBAN  Apply 1 application topically as needed.     sitaGLIPtin 100 MG tablet  Commonly known as:  JANUVIA  Take 1 tablet (100 mg total) by mouth daily.     triamcinolone cream 0.1 %  Commonly known as:  KENALOG     vardenafil 20 MG tablet  Commonly known as:  LEVITRA  Take 1 tablet (20 mg total) by mouth daily as needed for erectile dysfunction.           Objective:   Physical Exam BP 122/76 mmHg  Pulse 54  Temp(Src) 97.6 F (36.4 C) (Oral)  Ht 6\' 4"  (1.93 m)  Wt 275 lb (124.739 kg)  BMI 33.49 kg/m2  SpO2 97% General:   Well developed, well nourished . NAD.  HEENT:  Normocephalic . Face symmetric, atraumatic. Neck: No thyromegaly Lungs:  CTA B Normal respiratory effort, no intercostal retractions, no accessory muscle use. Heart: RRR,  no murmur.  no pretibial edema bilaterally  Abdomen:  Not distended, soft, non-tender. No rebound or rigidity. No mass,organomegaly Skin: Not pale. Not jaundice Neurologic:  alert & oriented X3.   Speech normal, gait appropriate for age and unassisted Psych--  Cognition and judgment appear intact.  Cooperative with normal attention span and concentration.  Behavior appropriate. No anxious or depressed appearing.    Assessment & Plan:   Assessment> DM Hyperlipidemia Hypothyroidism DJD  Plan Diabetes: Last A1c 7.2, metformin dose was increased, good compliance, tolerance. Check a BMP, A1c. Refill diabetic supplies Hypothyroidism: Synthroid dose adjusted the last time, check a TSH High cholesterol: Good compliance with Lipitor, check FLP Primary care: Flu shot, hep C, HIV testing. RTC January 2017 for a physical exam

## 2015-08-08 LAB — HEPATITIS C ANTIBODY: HCV Ab: NEGATIVE

## 2015-08-08 LAB — HIV ANTIBODY (ROUTINE TESTING W REFLEX): HIV 1&2 Ab, 4th Generation: NONREACTIVE

## 2015-08-30 ENCOUNTER — Other Ambulatory Visit: Payer: Self-pay | Admitting: Internal Medicine

## 2015-09-06 ENCOUNTER — Other Ambulatory Visit: Payer: Self-pay

## 2015-09-06 MED ORDER — SITAGLIPTIN PHOSPHATE 100 MG PO TABS: 100.0000 mg | ORAL_TABLET | Freq: Every day | ORAL | Status: DC

## 2015-09-23 ENCOUNTER — Other Ambulatory Visit: Payer: Self-pay | Admitting: Internal Medicine

## 2015-11-05 ENCOUNTER — Encounter: Payer: Medicare Other | Admitting: Internal Medicine

## 2015-11-23 ENCOUNTER — Encounter: Payer: Self-pay | Admitting: Internal Medicine

## 2015-11-26 MED ORDER — METFORMIN HCL ER 500 MG PO TB24: 1000.0000 mg | ORAL_TABLET | Freq: Two times a day (BID) | ORAL | Status: DC

## 2015-11-26 MED ORDER — ATORVASTATIN CALCIUM 40 MG PO TABS: 20.0000 mg | ORAL_TABLET | Freq: Every day | ORAL | Status: DC

## 2015-11-26 MED ORDER — SITAGLIPTIN PHOSPHATE 100 MG PO TABS: 100.0000 mg | ORAL_TABLET | Freq: Every day | ORAL | Status: DC

## 2015-11-26 MED ORDER — LEVOTHYROXINE SODIUM 75 MCG PO TABS: 75.0000 ug | ORAL_TABLET | Freq: Every day | ORAL | Status: DC

## 2015-11-26 NOTE — Telephone Encounter (Signed)
Requested Rx's sent to [facility].  

## 2015-12-02 MED ORDER — ACCU-CHEK AVIVA PLUS W/DEVICE KIT
PACK | Status: DC
Start: 2015-12-02 — End: 2023-03-09

## 2015-12-02 MED ORDER — GLUCOSE BLOOD VI STRP: ORAL_STRIP | Status: DC

## 2015-12-02 MED ORDER — LISINOPRIL 2.5 MG PO TABS: 2.5000 mg | ORAL_TABLET | Freq: Every day | ORAL | Status: DC

## 2015-12-02 MED ORDER — ACCU-CHEK SOFT TOUCH LANCETS MISC: Status: DC

## 2015-12-02 NOTE — Addendum Note (Signed)
Addended byDamita Dunnings D on: 12/02/2015 08:09 AM   Modules accepted: Orders

## 2015-12-02 NOTE — Telephone Encounter (Signed)
Accu-chek Aviva Plus meter and supplies sent to [facility].

## 2015-12-17 DIAGNOSIS — L812 Freckles: Secondary | ICD-10-CM | POA: Diagnosis not present

## 2015-12-17 DIAGNOSIS — L570 Actinic keratosis: Secondary | ICD-10-CM | POA: Diagnosis not present

## 2015-12-17 DIAGNOSIS — D2239 Melanocytic nevi of other parts of face: Secondary | ICD-10-CM | POA: Diagnosis not present

## 2015-12-17 DIAGNOSIS — L918 Other hypertrophic disorders of the skin: Secondary | ICD-10-CM | POA: Diagnosis not present

## 2015-12-17 DIAGNOSIS — D225 Melanocytic nevi of trunk: Secondary | ICD-10-CM | POA: Diagnosis not present

## 2015-12-17 DIAGNOSIS — B078 Other viral warts: Secondary | ICD-10-CM | POA: Diagnosis not present

## 2015-12-17 DIAGNOSIS — D235 Other benign neoplasm of skin of trunk: Secondary | ICD-10-CM | POA: Diagnosis not present

## 2015-12-20 ENCOUNTER — Other Ambulatory Visit: Payer: Self-pay | Admitting: Internal Medicine

## 2016-01-13 DIAGNOSIS — E119 Type 2 diabetes mellitus without complications: Secondary | ICD-10-CM | POA: Diagnosis not present

## 2016-01-20 LAB — HM DIABETES EYE EXAM

## 2016-01-27 ENCOUNTER — Encounter: Payer: Self-pay | Admitting: Internal Medicine

## 2016-01-27 ENCOUNTER — Ambulatory Visit (INDEPENDENT_AMBULATORY_CARE_PROVIDER_SITE_OTHER): Payer: Medicare Other | Admitting: Internal Medicine

## 2016-01-27 VITALS — BP 118/74 | HR 52 | Temp 98.1°F | Ht 76.0 in | Wt 280.2 lb

## 2016-01-27 DIAGNOSIS — Z09 Encounter for follow-up examination after completed treatment for conditions other than malignant neoplasm: Secondary | ICD-10-CM

## 2016-01-27 DIAGNOSIS — E039 Hypothyroidism, unspecified: Secondary | ICD-10-CM

## 2016-01-27 DIAGNOSIS — E785 Hyperlipidemia, unspecified: Secondary | ICD-10-CM

## 2016-01-27 DIAGNOSIS — Z0000 Encounter for general adult medical examination without abnormal findings: Secondary | ICD-10-CM | POA: Diagnosis not present

## 2016-01-27 DIAGNOSIS — E119 Type 2 diabetes mellitus without complications: Secondary | ICD-10-CM | POA: Diagnosis not present

## 2016-01-27 LAB — CBC WITH DIFFERENTIAL/PLATELET
Basophils Absolute: 0 10*3/uL (ref 0.0–0.1)
Basophils Relative: 0.8 % (ref 0.0–3.0)
Eosinophils Absolute: 0.3 10*3/uL (ref 0.0–0.7)
Eosinophils Relative: 4.8 % (ref 0.0–5.0)
HCT: 41.5 % (ref 39.0–52.0)
Hemoglobin: 13.8 g/dL (ref 13.0–17.0)
Lymphocytes Relative: 34.7 % (ref 12.0–46.0)
Lymphs Abs: 2.2 10*3/uL (ref 0.7–4.0)
MCHC: 33.2 g/dL (ref 30.0–36.0)
MCV: 93.3 fl (ref 78.0–100.0)
Monocytes Absolute: 0.6 10*3/uL (ref 0.1–1.0)
Monocytes Relative: 8.9 % (ref 3.0–12.0)
Neutro Abs: 3.2 10*3/uL (ref 1.4–7.7)
Neutrophils Relative %: 50.8 % (ref 43.0–77.0)
Platelets: 217 10*3/uL (ref 150.0–400.0)
RBC: 4.45 Mil/uL (ref 4.22–5.81)
RDW: 13.1 % (ref 11.5–15.5)
WBC: 6.3 10*3/uL (ref 4.0–10.5)

## 2016-01-27 LAB — LIPID PANEL
Cholesterol: 137 mg/dL (ref 0–200)
HDL: 45.6 mg/dL (ref >39.00)
LDL Cholesterol: 81 mg/dL (ref 0–99)
NonHDL: 91.32
Total CHOL/HDL Ratio: 3
Triglycerides: 51 mg/dL (ref 0.0–149.0)
VLDL: 10.2 mg/dL (ref 0.0–40.0)

## 2016-01-27 LAB — BASIC METABOLIC PANEL
BUN: 14 mg/dL (ref 6–23)
CO2: 27 mEq/L (ref 19–32)
Calcium: 9.1 mg/dL (ref 8.4–10.5)
Chloride: 104 mEq/L (ref 96–112)
Creatinine, Ser: 1.16 mg/dL (ref 0.40–1.50)
GFR: 67.47 mL/min (ref >60.00)
Glucose, Bld: 113 mg/dL — ABNORMAL HIGH (ref 70–99)
Potassium: 4.5 mEq/L (ref 3.5–5.1)
Sodium: 139 mEq/L (ref 135–145)

## 2016-01-27 LAB — HEMOGLOBIN A1C: Hgb A1c MFr Bld: 7 % — ABNORMAL HIGH (ref 4.6–6.5)

## 2016-01-27 LAB — TSH: TSH: 3.34 u[IU]/mL (ref 0.35–4.50)

## 2016-01-27 NOTE — Progress Notes (Signed)
Pre visit review using our clinic review tool, if applicable. No additional management support is needed unless otherwise documented below in the visit note. 

## 2016-01-27 NOTE — Patient Instructions (Signed)
GO TO THE LAB :      Get the blood work     GO TO THE FRONT DESK Schedule your next appointment for a  Routine check up When?   6 months  Fasting?  No

## 2016-01-27 NOTE — Assessment & Plan Note (Signed)
Td 2013; Zostavax 11-2012; Pneumonia shot - 2015;  prevnar--2015  Prostate cancer screening--DRE -PSA 2016 : wnl Had a colonoscopy at age 65. No records.  Had another colonoscopy 08-2014, + polyps, repeat in 5 years. See report.   Diet and exercise discussed

## 2016-01-27 NOTE — Progress Notes (Signed)
Subjective:    Patient ID: [name] [name], male    DOB: 1951/07/07, 65 y.o.   MRN: [id number]  DOS:  01/27/2016 Type of visit - description : CPX Interval history:  No major concerns, we did talk about  his chronic medical problems   Review of Systems Constitutional: No fever. No chills. No unexplained wt changes. No unusual sweats  HEENT: No dental problems, no ear discharge, no facial swelling, no voice changes. No eye discharge, no eye  redness , no  intolerance to light   Respiratory: No wheezing , no  difficulty breathing. No cough , no mucus production  Cardiovascular: No CP, no leg swelling , no  Palpitations  GI: no nausea, no vomiting, no diarrhea , no  abdominal pain.  No blood in the stools. No dysphagia, no odynophagia    Endocrine: No polyphagia, no polyuria , no polydipsia  GU: No dysuria, gross hematuria, difficulty urinating. No urinary urgency, no frequency.  Musculoskeletal: Occasional trigger finger, left, fourth digit  Skin: No change in the color of the skin, palor , no  Rash  Allergic, immunologic: No environmental allergies , no  food allergies  Neurological: No dizziness no  syncope. No headaches. No diplopia, no slurred, no slurred speech, no motor deficits, no facial  Numbness  Hematological: No enlarged lymph nodes, no easy bruising , no unusual bleedings  Psychiatry: No suicidal ideas, no hallucinations, no beavior problems, no confusion.  No unusual/severe anxiety, no depression   Past Medical History  Diagnosis Date  . Diabetes mellitus   . Hyperlipidemia   . DJD (degenerative joint disease)   . Colon polyps     Colon polyps at age 75, next Cscope age 56  . 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 Nasal septum surgery  2000  . Shoulder arthroscopy      L 2011, R 2012  . Bunionectomy Right 12/27/2014    [facility]    Social History   Social History  . Marital Status: Married   Spouse Name: N/A  . Number of Children: 2  . Years of Education: N/A   Occupational History  . disability [employer]   Social History Main Topics  . Smoking status: Former Research scientist (life sciences)  . Smokeless tobacco: Never Used     Comment: quit ~ 2012, << 1 ppd  . Alcohol Use: 0.0 oz/week    0 Standard drinks or equivalent per week     Comment: socially   . Drug Use: No  . Sexual Activity: Not on file   Other Topics Concern  . Not on file   Social History Narrative   Household-- pt and wife      Family History  Problem Relation Age of Onset  . Arthritis Mother   . Lung cancer Father   . Stroke Other   . Diabetes Other     uncle, brother (borderline)  . Colon cancer Neg Hx   . Prostate cancer Neg Hx   . CAD Neg Hx        Medication List       This list is accurate as of: 01/27/16 11:59 PM.  Always use your most recent med list.               ACCU-CHEK AVIVA PLUS w/Device Kit  Use to check blood sugar     accu-chek soft touch lancets  Check blood sugars no more than twice daily  aspirin 81 MG tablet  Take 81 mg by mouth daily.     atorvastatin 40 MG tablet  Commonly known as:  LIPITOR  Take 0.5 tablets (20 mg total) by mouth daily.     fish oil-omega-3 fatty acids 1000 MG capsule  Take 2 g by mouth daily.     glucose blood test strip  Commonly known as:  ACCU-CHEK AVIVA  Check blood sugar no more than twice daily     IBUPROFEN PO  Take by mouth as needed. Reported on 01/27/2016     levothyroxine 75 MCG tablet  Commonly known as:  SYNTHROID, LEVOTHROID  Take 1 tablet (75 mcg total) by mouth daily before breakfast.     lisinopril 2.5 MG tablet  Commonly known as:  PRINIVIL,ZESTRIL  Take 1 tablet (2.5 mg total) by mouth daily.     metFORMIN 500 MG 24 hr tablet  Commonly known as:  GLUCOPHAGE XR  Take 2 tablets (1,000 mg total) by mouth 2 (two) times daily with a meal.     multivitamin tablet  Take 1 tablet by mouth daily.     mupirocin cream 2 %    Commonly known as:  BACTROBAN  Apply 1 application topically as needed. Reported on 01/27/2016     sitaGLIPtin 100 MG tablet  Commonly known as:  JANUVIA  Take 1 tablet (100 mg total) by mouth daily.     triamcinolone cream 0.1 %  Commonly known as:  KENALOG     vardenafil 20 MG tablet  Commonly known as:  LEVITRA  Take 1 tablet (20 mg total) by mouth daily as needed for erectile dysfunction.           Objective:   Physical Exam BP 118/74 mmHg  Pulse 52  Temp(Src) 98.1 F (36.7 C) (Oral)  Ht '6\' 4"'  (1.93 m)  Wt 280 lb 4 oz (127.121 kg)  BMI 34.13 kg/m2  SpO2 97%  General:   Well developed, well nourished . NAD.  Neck: No  thyromegaly   HEENT:  Normocephalic . Face symmetric, atraumatic Lungs:  CTA B Normal respiratory effort, no intercostal retractions, no accessory muscle use. Heart: RRR,  no murmur.  No pretibial edema bilaterally  Abdomen:  Not distended, soft, non-tender. No rebound or rigidity.   Skin: Exposed areas without rash. Not pale. Not jaundice Neurologic:  alert & oriented X3.  Speech normal, gait appropriate for age and unassisted Strength symmetric and appropriate for age.  Psych: Cognition and judgment appear intact.  Cooperative with normal attention span and concentration.  Behavior appropriate. No anxious or depressed appearing.    Assessment & Plan:   Assessment> DM Hyperlipidemia Hypothyroidism DJD Pre-glaucoma E.D.  PLAN: DM: Continue lisinopril, metformin, Januvia. Check a BMP and A1c Hyperlipidemia: Continue Lipitor, check labs Preglaucoma: May need a referral to see ophthalmology Erectile dysfunction: Call for refills when needed RTC 6 months

## 2016-01-28 NOTE — Assessment & Plan Note (Signed)
DM: Continue lisinopril, metformin, Januvia. Check a BMP and A1c Hyperlipidemia: Continue Lipitor, check labs Preglaucoma: May need a referral to see ophthalmology Erectile dysfunction: Call for refills when needed RTC 6 months

## 2016-03-28 ENCOUNTER — Other Ambulatory Visit: Payer: Self-pay | Admitting: Internal Medicine

## 2016-05-05 ENCOUNTER — Encounter: Payer: Self-pay | Admitting: Podiatry

## 2016-05-05 ENCOUNTER — Ambulatory Visit (INDEPENDENT_AMBULATORY_CARE_PROVIDER_SITE_OTHER): Payer: Medicare Other | Admitting: Podiatry

## 2016-05-05 VITALS — BP 159/83 | HR 63

## 2016-05-05 DIAGNOSIS — M21969 Unspecified acquired deformity of unspecified lower leg: Secondary | ICD-10-CM

## 2016-05-05 DIAGNOSIS — M79671 Pain in right foot: Secondary | ICD-10-CM | POA: Diagnosis not present

## 2016-05-05 DIAGNOSIS — D3613 Benign neoplasm of peripheral nerves and autonomic nervous system of lower limb, including hip: Secondary | ICD-10-CM

## 2016-05-05 DIAGNOSIS — G8929 Other chronic pain: Secondary | ICD-10-CM

## 2016-05-05 NOTE — Progress Notes (Signed)
S/P [place] bunionectomy right March 2016. Sharp nerve like pain under 2nd toe right at PIPJ and MPJ.  Objective: Since the bunion surgery the first metatarsal got shorter and long 2nd ray is getting retrograde force. Neuroma like pain under 2nd intermetatarsal space right. Long 2nd ray.  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 S/P [place] bunionectomy right. Neuroma like pain 2nd interspace right.  Plan: Reviewed findings. Added metatarsal pad under 2nd and 3rd MPJ right. Return with orthotics for permanent posting.

## 2016-05-05 NOTE — Patient Instructions (Signed)
S/P Right [place] bunionectomy. Pain under 2nd toe right. Metatarsal pad placed over existing orthotics. If helps will put permanent posting. Return as needed.

## 2016-06-25 ENCOUNTER — Other Ambulatory Visit: Payer: Self-pay | Admitting: Internal Medicine

## 2016-07-23 ENCOUNTER — Encounter: Payer: Self-pay | Admitting: Internal Medicine

## 2016-07-23 DIAGNOSIS — Z83511 Family history of glaucoma: Secondary | ICD-10-CM | POA: Diagnosis not present

## 2016-07-23 DIAGNOSIS — H40053 Ocular hypertension, bilateral: Secondary | ICD-10-CM | POA: Diagnosis not present

## 2016-07-23 DIAGNOSIS — H40013 Open angle with borderline findings, low risk, bilateral: Secondary | ICD-10-CM | POA: Diagnosis not present

## 2016-07-28 ENCOUNTER — Encounter: Payer: Self-pay | Admitting: Internal Medicine

## 2016-07-28 ENCOUNTER — Ambulatory Visit (INDEPENDENT_AMBULATORY_CARE_PROVIDER_SITE_OTHER): Payer: Medicare Other | Admitting: Internal Medicine

## 2016-07-28 VITALS — BP 132/70 | HR 79 | Temp 97.5°F | Resp 14 | Ht 76.0 in | Wt 269.5 lb

## 2016-07-28 DIAGNOSIS — R351 Nocturia: Secondary | ICD-10-CM | POA: Diagnosis not present

## 2016-07-28 DIAGNOSIS — Z23 Encounter for immunization: Secondary | ICD-10-CM | POA: Diagnosis not present

## 2016-07-28 DIAGNOSIS — E785 Hyperlipidemia, unspecified: Secondary | ICD-10-CM | POA: Diagnosis not present

## 2016-07-28 DIAGNOSIS — E118 Type 2 diabetes mellitus with unspecified complications: Secondary | ICD-10-CM | POA: Diagnosis not present

## 2016-07-28 LAB — BASIC METABOLIC PANEL
BUN: 18 mg/dL (ref 6–23)
CO2: 31 mEq/L (ref 19–32)
Calcium: 9 mg/dL (ref 8.4–10.5)
Chloride: 103 mEq/L (ref 96–112)
Creatinine, Ser: 1.24 mg/dL (ref 0.40–1.50)
GFR: 62.38 mL/min (ref >60.00)
Glucose, Bld: 131 mg/dL — ABNORMAL HIGH (ref 70–99)
Potassium: 4.4 mEq/L (ref 3.5–5.1)
Sodium: 140 mEq/L (ref 135–145)

## 2016-07-28 LAB — URINALYSIS, ROUTINE W REFLEX MICROSCOPIC
Bilirubin Urine: NEGATIVE
Hgb urine dipstick: NEGATIVE
Ketones, ur: NEGATIVE
Leukocytes, UA: NEGATIVE
Nitrite: NEGATIVE
RBC / HPF: NONE SEEN (ref 0–?)
Specific Gravity, Urine: <=1.005 — AB (ref 1.000–1.030)
Total Protein, Urine: NEGATIVE
Urine Glucose: NEGATIVE
Urobilinogen, UA: 0.2 (ref 0.0–1.0)
WBC, UA: NONE SEEN (ref 0–?)
pH: 6 (ref 5.0–8.0)

## 2016-07-28 LAB — AST: AST: 12 U/L (ref 0–37)

## 2016-07-28 LAB — PSA: PSA: 1.12 ng/mL (ref 0.10–4.00)

## 2016-07-28 LAB — ALT: ALT: 17 U/L (ref 0–53)

## 2016-07-28 LAB — HEMOGLOBIN A1C: Hgb A1c MFr Bld: 6.5 % (ref 4.6–6.5)

## 2016-07-28 MED ORDER — TAMSULOSIN HCL 0.4 MG PO CAPS: 0.4000 mg | ORAL_CAPSULE | Freq: Every day | ORAL | 1 refills | Status: DC

## 2016-07-28 NOTE — Progress Notes (Signed)
Pre visit review using our clinic review tool, if applicable. No additional management support is needed unless otherwise documented below in the visit note. 

## 2016-07-28 NOTE — Patient Instructions (Addendum)
GO TO THE LAB : Get the blood work     GO TO THE FRONT DESK Schedule your next appointment for a  routine checkup in 6 months  Start Flomax 1 tablet at night  Notify your eye doctor you started  Flomax

## 2016-07-28 NOTE — Progress Notes (Signed)
Subjective:    Patient ID: [name] [name], male    DOB: [birth year], 65 y.o.   MRN: [id number]  DOS:  07/28/2016 Type of visit - description : rov Interval history: Medication list reviewed, good compliance, he takes Lipitor half tablet daily Labs reviewed, appropriate labs to be checked. Has a history of a trigger finger, left ring finger, better. Complain of nocturia, 2 times a night most nights, is disturbing his sleep. Options?   Review of Systems No fever chills No dysuria, gross hematuria, some difficulty urinating ( "stream is not the same")  Past Medical History:  Diagnosis Date  . 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 Colon polyps    Colon polyps at age 41, next Cscope age 53  . Diabetes mellitus   . DJD (degenerative joint disease)   . Hyperlipidemia     Past Surgical History:  Procedure Laterality Date  . BUNIONECTOMY Right [facility]/27/2014   [facility]  . NASAL SEPTUM SURGERY  2000  . SHOULDER ARTHROSCOPY     L 2011, R 2012    Social History   Social History  . Marital status: Married    Spouse name: N/A  . Number of children: 2  . Years of education: N/A   Occupational History  . disability [employer]   Social History Main Topics  . Smoking status: Former Research scientist (life sciences)  . Smokeless tobacco: Never Used     Comment: quit ~ 2012, << 1 ppd  . Alcohol use 0.0 oz/week     Comment: socially   . Drug use: No  . Sexual activity: Not on file   Other Topics Concern  . Not on file   Social History Narrative   Household-- pt and wife         Medication List       Accurate as of 07/28/16 11:59 PM. Always use your most recent med list.          ACCU-CHEK AVIVA PLUS w/Device Kit Use to check blood sugar   accu-chek soft touch lancets Check blood sugars no more than twice daily   aspirin 81 MG tablet Take 81 mg by mouth daily.   atorvastatin 40 MG tablet Commonly known as:  LIPITOR Take 1 tablet (40 mg total) by mouth daily.   fish oil-omega-3 fatty  acids 1000 MG capsule Take 2 g by mouth daily.   glucose blood test strip Commonly known as:  ACCU-CHEK AVIVA Check blood sugar no more than twice daily   IBUPROFEN PO Take by mouth as needed. Reported on 01/27/2016   levothyroxine 75 MCG tablet Commonly known as:  SYNTHROID, LEVOTHROID Take 1 tablet (75 mcg total) by mouth daily before breakfast.   lisinopril 2.5 MG tablet Commonly known as:  PRINIVIL,ZESTRIL Take 1 tablet (2.5 mg total) by mouth daily.   metFORMIN 500 MG 24 hr tablet Commonly known as:  GLUCOPHAGE-XR Take 2 tablets (1,000 mg total) by mouth 2 (two) times daily.   multivitamin tablet Take 1 tablet by mouth daily.   mupirocin cream 2 % Commonly known as:  BACTROBAN Apply 1 application topically as needed. Reported on 01/27/2016   sitaGLIPtin 100 MG tablet Commonly known as:  JANUVIA Take 1 tablet (100 mg total) by mouth daily.   tamsulosin 0.4 MG Caps capsule Commonly known as:  FLOMAX Take 1 capsule (0.4 mg total) by mouth daily after supper.   triamcinolone cream 0.1 % Commonly known as:  KENALOG   vardenafil 20 MG tablet Commonly known as:  LEVITRA Take 1 tablet (20 mg total) by mouth daily as needed for erectile dysfunction.          Objective:   Physical Exam BP 132/70 (BP Location: Left Arm, Patient Position: Sitting, Cuff Size: Normal)   Pulse 79   Temp 97.5 F (36.4 C) (Oral)   Resp 14   Ht _0  (1.93 m)   Wt 269 lb 8 oz (122.2 kg)   SpO2 97%   BMI 32.80 kg/m  General:   Well developed, well nourished . NAD.  HEENT:  Normocephalic . Face symmetric, atraumatic Lungs:  CTA B Normal respiratory effort, no intercostal retractions, no accessory muscle use. Heart: RRR,  no murmur.  no pretibial edema bilaterally  Abdomen:  Not distended, soft, non-tender. No rebound or rigidity.  Rectal:  External abnormalities: none. Normal sphincter tone. No rectal masses or tenderness.  Stool brown  Prostate: Prostate gland firm and smooth,  no enlargement, nodularity, tenderness, mass, asymmetry or induration.  Skin: Not pale. Not jaundice Neurologic:  alert & oriented X3.  Speech normal, gait appropriate for age and unassisted Psych--  Cognition and judgment appear intact.  Cooperative with normal attention span and concentration.  Behavior appropriate. No anxious or depressed appearing.    Assessment & Plan:   Assessment> DM Hyperlipidemia Hypothyroidism DJD Pre-glaucoma E.D.  PLAN: Nocturia: DRE normal, check a UA, urine culture, PSA. Trial with Flomax. DM: Continue lisinopril, metformin, Januvia. Last A1c 7, encourage healthy diet. Check a BMP and A1c Hyperlipidemia: On Lipitor 40 mg half tablet daily, last FLP satisfactory, check AST, ALT  Preglaucoma:notify ophthalmology he will start Flomax  Primary care: Flu shot RTC 6  Months . Routine checkup

## 2016-07-29 NOTE — Assessment & Plan Note (Signed)
Nocturia: DRE normal, check a UA, urine culture, PSA. Trial with Flomax. DM: Continue lisinopril, metformin, Januvia. Last A1c 7, encourage healthy diet. Check a BMP and A1c Hyperlipidemia: On Lipitor 40 mg half tablet daily, last FLP satisfactory, check AST, ALT  Preglaucoma:notify ophthalmology he will start Flomax  Primary care: Flu shot RTC 6  Months . Routine checkup

## 2016-07-30 LAB — URINE CULTURE: Organism ID, Bacteria: NO GROWTH

## 2016-08-12 ENCOUNTER — Encounter: Payer: Self-pay | Admitting: Internal Medicine

## 2016-08-20 NOTE — Telephone Encounter (Signed)
Pt came in the office stating that [facility] states they have never received the rx for the tamsulosin (flomax), [facility] informed the pt that they sent the request again on 10/18 but has gotten no response.

## 2016-08-26 ENCOUNTER — Telehealth: Payer: Self-pay | Admitting: Internal Medicine

## 2016-08-26 DIAGNOSIS — N529 Male erectile dysfunction, unspecified: Secondary | ICD-10-CM

## 2016-08-26 MED ORDER — TAMSULOSIN HCL 0.4 MG PO CAPS: 0.4000 mg | ORAL_CAPSULE | Freq: Every day | ORAL | 1 refills | Status: DC

## 2016-08-26 NOTE — Telephone Encounter (Signed)
Rx sent 

## 2016-08-26 NOTE — Telephone Encounter (Signed)
Patient called stating [facility] is telling him that they still have not received the prescription for tamsulosin (FLOMAX) 0.4 MG CAPS capsule. Patient would like a call when this is taken care of. Please advise  Patient phone: [phone]

## 2016-09-30 ENCOUNTER — Other Ambulatory Visit: Payer: Self-pay | Admitting: Internal Medicine

## 2016-10-22 DIAGNOSIS — H40013 Open angle with borderline findings, low risk, bilateral: Secondary | ICD-10-CM | POA: Diagnosis not present

## 2016-12-17 DIAGNOSIS — D225 Melanocytic nevi of trunk: Secondary | ICD-10-CM | POA: Diagnosis not present

## 2016-12-17 DIAGNOSIS — L578 Other skin changes due to chronic exposure to nonionizing radiation: Secondary | ICD-10-CM | POA: Diagnosis not present

## 2016-12-17 DIAGNOSIS — D2261 Melanocytic nevi of right upper limb, including shoulder: Secondary | ICD-10-CM | POA: Diagnosis not present

## 2016-12-17 DIAGNOSIS — D2262 Melanocytic nevi of left upper limb, including shoulder: Secondary | ICD-10-CM | POA: Diagnosis not present

## 2016-12-17 DIAGNOSIS — D235 Other benign neoplasm of skin of trunk: Secondary | ICD-10-CM | POA: Diagnosis not present

## 2016-12-30 ENCOUNTER — Other Ambulatory Visit: Payer: Self-pay | Admitting: Internal Medicine

## 2016-12-30 DIAGNOSIS — N529 Male erectile dysfunction, unspecified: Secondary | ICD-10-CM

## 2017-01-25 ENCOUNTER — Telehealth: Payer: Self-pay | Admitting: *Deleted

## 2017-01-25 NOTE — Telephone Encounter (Signed)
Called patient and left message to return call

## 2017-01-28 ENCOUNTER — Encounter: Payer: Self-pay | Admitting: Internal Medicine

## 2017-01-28 ENCOUNTER — Ambulatory Visit (INDEPENDENT_AMBULATORY_CARE_PROVIDER_SITE_OTHER): Payer: Medicare Other | Admitting: Internal Medicine

## 2017-01-28 ENCOUNTER — Ambulatory Visit: Payer: Medicare Other | Admitting: *Deleted

## 2017-01-28 VITALS — BP 132/72 | HR 56 | Temp 97.6°F | Resp 16 | Ht 77.25 in | Wt 268.4 lb

## 2017-01-28 DIAGNOSIS — E119 Type 2 diabetes mellitus without complications: Secondary | ICD-10-CM | POA: Diagnosis not present

## 2017-01-28 DIAGNOSIS — H40013 Open angle with borderline findings, low risk, bilateral: Secondary | ICD-10-CM | POA: Diagnosis not present

## 2017-01-28 DIAGNOSIS — R351 Nocturia: Secondary | ICD-10-CM

## 2017-01-28 DIAGNOSIS — H35372 Puckering of macula, left eye: Secondary | ICD-10-CM | POA: Diagnosis not present

## 2017-01-28 DIAGNOSIS — Z0000 Encounter for general adult medical examination without abnormal findings: Secondary | ICD-10-CM | POA: Diagnosis not present

## 2017-01-28 DIAGNOSIS — E039 Hypothyroidism, unspecified: Secondary | ICD-10-CM | POA: Diagnosis not present

## 2017-01-28 DIAGNOSIS — Z83511 Family history of glaucoma: Secondary | ICD-10-CM | POA: Diagnosis not present

## 2017-01-28 DIAGNOSIS — E785 Hyperlipidemia, unspecified: Secondary | ICD-10-CM | POA: Diagnosis not present

## 2017-01-28 DIAGNOSIS — H35341 Macular cyst, hole, or pseudohole, right eye: Secondary | ICD-10-CM | POA: Diagnosis not present

## 2017-01-28 LAB — BASIC METABOLIC PANEL
BUN: 13 mg/dL (ref 6–23)
CO2: 27 mEq/L (ref 19–32)
Calcium: 9.2 mg/dL (ref 8.4–10.5)
Chloride: 102 mEq/L (ref 96–112)
Creatinine, Ser: 1.18 mg/dL (ref 0.40–1.50)
GFR: 65.94 mL/min (ref >60.00)
Glucose, Bld: 111 mg/dL — ABNORMAL HIGH (ref 70–99)
Potassium: 4.3 mEq/L (ref 3.5–5.1)
Sodium: 135 mEq/L (ref 135–145)

## 2017-01-28 LAB — LIPID PANEL
Cholesterol: 148 mg/dL (ref 0–200)
HDL: 51.4 mg/dL (ref >39.00)
LDL Cholesterol: 84 mg/dL (ref 0–99)
NonHDL: 96.51
Total CHOL/HDL Ratio: 3
Triglycerides: 65 mg/dL (ref 0.0–149.0)
VLDL: 13 mg/dL (ref 0.0–40.0)

## 2017-01-28 LAB — TSH: TSH: 2.06 u[IU]/mL (ref 0.35–4.50)

## 2017-01-28 LAB — HM DIABETES EYE EXAM

## 2017-01-28 LAB — HEMOGLOBIN A1C: Hgb A1c MFr Bld: 6.6 % — ABNORMAL HIGH (ref 4.6–6.5)

## 2017-01-28 MED ORDER — SILDENAFIL CITRATE 20 MG PO TABS: 40.0000 mg | ORAL_TABLET | Freq: Every day | ORAL | 3 refills | Status: DC | PRN

## 2017-01-28 MED ORDER — SOLIFENACIN SUCCINATE 5 MG PO TABS: 5.0000 mg | ORAL_TABLET | Freq: Every day | ORAL | 3 refills | Status: DC

## 2017-01-28 NOTE — Patient Instructions (Addendum)
GO TO THE LAB : Get the blood work     GO TO THE FRONT DESK Schedule your next appointment for a  checkup in 6 months  Stop Flomax  After few weeks try Vesicare and see if that works  Call refills as needed

## 2017-01-28 NOTE — Progress Notes (Addendum)
Subjective:   [name] [name] is a 66 y.o. male who presents for Medicare Annual/Subsequent preventive examination.  Patient reports overall doing well. He is checking his blood sugar about once daily at different times of day, averaging 104-106. 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 Him and wife take frequent road trips and also enjoy playing golf together.  He states he smoked a few cigarettes (less than a pack) last month, but since has quit again.  Review of Systems:  No ROS.  Medicare Wellness Visit.  Cardiac Risk Factors include: dyslipidemia;diabetes mellitus;advanced age (>8mn, >>23women);hypertension;male gender  Sleep patterns: has restless sleep, has frequent nighttime awakenings and gets up 1 times nightly to void. Sleeps in recliner part of the night. Is planning on getting new mattress to help w/ shoulder. Home Safety/Smoke Alarms: Feels safe in home. Smoke alarms in place.   Living environment; residence and Firearm Safety: Lives w/ wife. 2-story house, firearms stored safely. Seat Belt Safety/Bike Helmet: Wears seat belt.   Counseling:   Eye Exam- Dr. PAlois Cliche4x yearly. Next appointment is this afternoon. Dental- Family Dentistry every 6 months  Male:   CCS- last 09/05/14. One 568mpolyp removed. 5 year recall per path report. PSA-  Lab Results  Component Value Date   PSA 1.12 07/28/2016   PSA 1.05 10/31/2014   PSA 0.67 08/04/2012      Objective:    Vitals: BP 132/72   Pulse (!) 56   Temp 97.6 F (36.4 C) (Oral)   Resp 16   Ht 6' 5.25" (1.962 m)   Wt 268 lb 6.4 oz (121.7 kg)   SpO2 98%   BMI 31.62 kg/m   Body mass index is 31.62 kg/m.  Tobacco History  Smoking Status  . Former Smoker  Smokeless Tobacco  . Never Used    Comment: quit ~ 2012, << 1 ppd     Counseling given: Not Answered   Past Medical History:  Diagnosis Date  . 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 Colon polyps    Colon polyps at age [age]next Cscope age 66 . Diabetes mellitus   . DJD (degenerative joint disease)   . Hyperlipidemia    Past Surgical History:  Procedure Laterality Date  . BUNIONECTOMY Right 12/27/2014   [facility]  . NASAL SEPTUM SURGERY  2000  . SHOULDER ARTHROSCOPY     L 2011, R 2012   Family History  Problem Relation Age of Onset  . Arthritis Mother   . Alzheimer's disease Mother   . Lung cancer Father   . Stroke Other   . Diabetes Other     uncle, brother (borderline)  . Colon cancer Neg Hx   . Prostate cancer Neg Hx   . CAD Neg Hx    History  Sexual Activity  . Sexual activity: Not on file    Outpatient Encounter Prescriptions as of 01/28/2017  Medication Sig  . aspirin 81 MG tablet Take 81 mg by mouth daily.  . Marland Kitchentorvastatin (LIPITOR) 40 MG tablet Take 1 tablet (40 mg total) by mouth daily.  . Blood Glucose Monitoring Suppl (ACCU-CHEK AVIVA PLUS) w/Device KIT Use to check blood sugar  . fish oil-omega-3 fatty acids 1000 MG capsule Take 2 g by mouth daily.  . Marland Kitchenlucose blood (ACCU-CHEK AVIVA) test strip Check blood sugar no more than twice daily  . IBUPROFEN PO Take by mouth as needed. Reported on 01/27/2016  . Lancets (ACCU-CHEK SOFT TOUCH)  lancets Check blood sugars no more than twice daily  . levothyroxine (SYNTHROID, LEVOTHROID) 75 MCG tablet Take 1 tablet (75 mcg total) by mouth daily before breakfast.  . lisinopril (PRINIVIL,ZESTRIL) 2.5 MG tablet Take 1 tablet (2.5 mg total) by mouth daily.  . metFORMIN (GLUCOPHAGE-XR) 500 MG 24 hr tablet Take 2 tablets (1,000 mg total) by mouth 2 (two) times daily.  . Multiple Vitamin (MULTIVITAMIN) tablet Take 1 tablet by mouth daily.  . mupirocin cream (BACTROBAN) 2 % Apply 1 application topically as needed. Reported on 01/27/2016  . sitaGLIPtin (JANUVIA) 100 MG tablet Take 1 tablet (100 mg total) by mouth daily.  . tamsulosin (FLOMAX) 0.4 MG CAPS capsule Take 1 capsule (0.4 mg total) by mouth daily after supper.  . triamcinolone cream (KENALOG) 0.1 %   .  vardenafil (LEVITRA) 20 MG tablet Take 1 tablet (20 mg total) by mouth daily as needed for erectile dysfunction.   No facility-administered encounter medications on file as of 01/28/2017.     Activities of Daily Living In your present state of health, do you have any difficulty performing the following activities: 01/28/2017  Hearing? N  Vision? N  Difficulty concentrating or making decisions? N  Walking or climbing stairs? N  Dressing or bathing? N  Doing errands, shopping? N  Preparing Food and eating ? N  Using the Toilet? N  In the past six months, have you accidently leaked urine? N  Do you have problems with loss of bowel control? N  Managing your Medications? N  Managing your Finances? N  Housekeeping or managing your Housekeeping? N  Some recent data might be hidden    Patient Care Team: Colon Branch, MD as PCP - General (Internal Medicine) Juluis Rainier (Optometry) Jarome Matin, MD as Consulting Physician (Dermatology)   Assessment:    Physical assessment deferred to PCP.  Exercise Activities and Dietary recommendations Current Exercise Habits: Home exercise routine, Type of exercise: Other - see comments (stationary bike), Frequency (Times/Week): 3, Exercise limited by: orthopedic condition(s) (hammer toe). Plays golf frequently.  Diet (meal preparation, eat out, water intake, caffeinated beverages, dairy products, fruits and vegetables): well balanced, on average, 2-3 meals per day. Describes diet as in between healthy and unhealthy. Does not much like breakfast, but may eat cereal or oatmeal cookies or PB crackers. Wife does most of the cooking/meal prep. Drinks Diet Mtn Dew, unsweet tea, and water. Lunch: varies- burger, salad, chicken, PB sandwich Dinner: varies-wife prepares meal.   Goals    . Increase physical activity      Foot Exam Diabetic Foot Exam - Simple   Simple Foot Form Diabetic Foot exam was performed with the following findings:  Yes 01/28/2017 10:02 AM    Visual Inspection No deformities, no ulcerations, no other skin breakdown bilaterally:  Yes Sensation Testing Intact to touch and monofilament testing bilaterally:  Yes Pulse Check Posterior Tibialis and Dorsalis pulse intact bilaterally:  Yes Comments    Fall Risk Fall Risk  01/28/2017 01/27/2016 08/07/2015 10/30/2013  Falls in the past year? No No No No   Depression Screen PHQ 2/9 Scores 01/28/2017 01/27/2016 08/07/2015 10/30/2013  PHQ - 2 Score 0 0 0 0    Cognitive Function MMSE - Mini Mental State Exam 01/28/2017  Orientation to time 5  Orientation to Place 5  Registration 3  Attention/ Calculation 5  Recall 2  Language- name 2 objects 2  Language- repeat 1  Language- follow 3 step command 3  Language- read &  follow direction 1  Write a sentence 1  Copy design 1  Total score 29        Immunization History  Administered Date(s) Administered  . Influenza Split 08/04/2012, 07/27/2014  . Influenza,inj,Quad PF,36+ Mos 08/15/2013, 08/07/2015, 07/28/2016  . Pneumococcal Conjugate-13 04/30/2014  . Pneumococcal Polysaccharide-23 10/30/2013  . Tdap 08/04/2012  . Zoster 12/05/2012   Screening Tests Health Maintenance  Topic Date Due  . OPHTHALMOLOGY EXAM  01/19/2017  . HEMOGLOBIN A1C  01/26/2017  . INFLUENZA VACCINE  05/26/2017  . FOOT EXAM  01/28/2018  . COLONOSCOPY  09/06/2019  . TETANUS/TDAP  08/04/2022  . Hepatitis C Screening  Completed  . HIV Screening  Completed      Plan:   Follow-up w/ PCP as directed.  Continue to eat heart healthy diet (full of fruits, vegetables, whole grains, lean protein, water--limit salt, fat, and sugar intake) and increase physical activity as tolerated.  Bring a copy of your advance directives to your next office visit.  During the course of the visit the patient was educated and counseled about the following appropriate screening and preventive services:   Vaccines to include Pneumococcal, Influenza, Td, HCV  Cardiovascular  Disease  Colorectal cancer screening  Diabetes screening  Prostate Cancer Screening  Glaucoma screening  Nutrition counseling   Patient Instructions (the written plan) was given to the patient.    Dorrene German, RN  01/28/2017  Kathlene November, MD

## 2017-01-28 NOTE — Progress Notes (Signed)
Subjective:    Patient ID: [name] [name], male    DOB: 24-Mar-1951, 66 y.o.   MRN: [id number]  DOS:  01/28/2017 Type of visit - description : Routine checkup Interval history: DM: Good compliance of medication, ambulatory CBGs around 104 High cholesterol: Due for an FLP Hypothyroidism: Good compliance of medication Nocturia: tried  Flomax, no major difference in his symptoms.   Review of Systems   No chest pain or difficulty breathing No nausea, vomiting, diarrhea  Past Medical History:  Diagnosis Date  . 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 Colon polyps    Colon polyps at age 89, next Cscope age 82  . Diabetes mellitus   . DJD (degenerative joint disease)   . Hyperlipidemia     Past Surgical History:  Procedure Laterality Date  . BUNIONECTOMY Right 12/27/2014   [facility]  . NASAL SEPTUM SURGERY  2000  . SHOULDER ARTHROSCOPY     L 2011, R 2012    Social History   Social History  . Marital status: Married    Spouse name: N/A  . Number of children: 2  . Years of education: N/A   Occupational History  . disability [employer]   Social History Main Topics  . Smoking status: Former Research scientist (life sciences)  . Smokeless tobacco: Never Used     Comment: quit ~ 2012, << 1 ppd  . Alcohol use 1.2 oz/week    2 Cans of beer per week     Comment: socially   . Drug use: No  . Sexual activity: Not on file   Other Topics Concern  . Not on file   Social History Narrative   Household-- pt and wife       Allergies as of 01/28/2017   No Known Allergies     Medication List       Accurate as of 01/28/17 11:59 PM. Always use your most recent med list.          ACCU-CHEK AVIVA PLUS w/Device Kit Use to check blood sugar   accu-chek soft touch lancets Check blood sugars no more than twice daily   aspirin 81 MG tablet Take 81 mg by mouth daily.   atorvastatin 40 MG tablet Commonly known as:  LIPITOR Take 1 tablet (40 mg total) by mouth daily.   fish oil-omega-3 fatty acids 1000  MG capsule Take 2 g by mouth daily.   glucose blood test strip Commonly known as:  ACCU-CHEK AVIVA Check blood sugar no more than twice daily   IBUPROFEN PO Take by mouth as needed. Reported on 01/27/2016   levothyroxine 75 MCG tablet Commonly known as:  SYNTHROID, LEVOTHROID Take 1 tablet (75 mcg total) by mouth daily before breakfast.   lisinopril 2.5 MG tablet Commonly known as:  PRINIVIL,ZESTRIL Take 1 tablet (2.5 mg total) by mouth daily.   metFORMIN 500 MG 24 hr tablet Commonly known as:  GLUCOPHAGE-XR Take 2 tablets (1,000 mg total) by mouth 2 (two) times daily.   multivitamin tablet Take 1 tablet by mouth daily.   mupirocin cream 2 % Commonly known as:  BACTROBAN Apply 1 application topically as needed. Reported on 01/27/2016   sildenafil 20 MG tablet Commonly known as:  REVATIO Take 2-3 tablets (40-60 mg total) by mouth daily as needed.   sitaGLIPtin 100 MG tablet Commonly known as:  JANUVIA Take 1 tablet (100 mg total) by mouth daily.   solifenacin 5 MG tablet Commonly known as:  VESICARE Take 1 tablet (5 mg total) by  mouth daily.   triamcinolone cream 0.1 % Commonly known as:  KENALOG          Objective:   Physical Exam BP 132/72   Pulse (!) 56   Temp 97.6 F (36.4 C) (Oral)   Resp 16   Ht 6' 5.25" (1.962 m)   Wt 268 lb 6.4 oz (121.7 kg)   SpO2 98%   BMI 31.62 kg/m  General:   Well developed, well nourished . NAD.  HEENT:  Normocephalic . Face symmetric, atraumatic Lungs:  CTA B Normal respiratory effort, no intercostal retractions, no accessory muscle use. Heart: RRR,  no murmur.  no pretibial edema bilaterally  Abdomen:  Not distended, soft, non-tender. No rebound or rigidity.  Skin: Not pale. Not jaundice Neurologic:  alert & oriented X3.  Speech normal, gait appropriate for age and unassisted Psych--  Cognition and judgment appear intact.  Cooperative with normal attention span and concentration.  Behavior appropriate. No  anxious or depressed appearing.    Assessment & Plan:    Assessment DM- on ACEi Hyperlipidemia Hypothyroidism DJD Pre-glaucoma E.D. Chronic R foot pain, s/p surgery w/ podiatry 2016  PLAN: DM: Continue with metformin, lisinopril, Januvia. Checking a BMP and A1c Hyperlipidemia: Continue Lipitor, check a FLP, last LFT satisfactory Hypothyroidism: Continue Synthroid, check a TSH Nocturia: see last OV-  UA, urine culture, PSA and rectal exam were normal. Trial with Flomax did not help much, we agreed to stop Flomax and start Vesicare in few weeks. If no better I suggest a urology referral but the patient would be reluctant to pursue that. ED: Like to try generic Viagra prescription printed. RTC 6 months

## 2017-01-28 NOTE — Progress Notes (Signed)
Pre visit review using our clinic review tool, if applicable. No additional management support is needed unless otherwise documented below in the visit note. 

## 2017-01-28 NOTE — Assessment & Plan Note (Signed)
Had a medicare wellness today

## 2017-01-29 NOTE — Assessment & Plan Note (Signed)
DM: Continue with metformin, lisinopril, Januvia. Checking a BMP and A1c Hyperlipidemia: Continue Lipitor, check a FLP, last LFT satisfactory Hypothyroidism: Continue Synthroid, check a TSH Nocturia: see last OV-  UA, urine culture, PSA and rectal exam were normal. Trial with Flomax did not help much, we agreed to stop Flomax and start Vesicare in few weeks. If no better I suggest a urology referral but the patient would be reluctant to pursue that. ED: Like to try generic Viagra prescription printed. RTC 6 months

## 2017-02-01 ENCOUNTER — Encounter: Payer: Self-pay | Admitting: Internal Medicine

## 2017-02-08 ENCOUNTER — Encounter: Payer: Self-pay | Admitting: Internal Medicine

## 2017-02-11 ENCOUNTER — Ambulatory Visit: Payer: Medicare Other | Admitting: *Deleted

## 2017-03-21 ENCOUNTER — Other Ambulatory Visit: Payer: Self-pay | Admitting: Internal Medicine

## 2017-05-31 DIAGNOSIS — H40013 Open angle with borderline findings, low risk, bilateral: Secondary | ICD-10-CM | POA: Diagnosis not present

## 2017-08-04 ENCOUNTER — Encounter: Payer: Self-pay | Admitting: Internal Medicine

## 2017-08-04 ENCOUNTER — Ambulatory Visit (INDEPENDENT_AMBULATORY_CARE_PROVIDER_SITE_OTHER): Payer: Medicare Other | Admitting: Internal Medicine

## 2017-08-04 VITALS — BP 124/68 | HR 61 | Temp 98.1°F | Resp 14 | Ht 77.0 in | Wt 254.2 lb

## 2017-08-04 DIAGNOSIS — Z23 Encounter for immunization: Secondary | ICD-10-CM

## 2017-08-04 DIAGNOSIS — F17200 Nicotine dependence, unspecified, uncomplicated: Secondary | ICD-10-CM

## 2017-08-04 DIAGNOSIS — Z0000 Encounter for general adult medical examination without abnormal findings: Secondary | ICD-10-CM | POA: Diagnosis not present

## 2017-08-04 DIAGNOSIS — E871 Hypo-osmolality and hyponatremia: Secondary | ICD-10-CM | POA: Diagnosis not present

## 2017-08-04 DIAGNOSIS — Z122 Encounter for screening for malignant neoplasm of respiratory organs: Secondary | ICD-10-CM

## 2017-08-04 LAB — CBC WITH DIFFERENTIAL/PLATELET
Basophils Absolute: 0.1 10*3/uL (ref 0.0–0.1)
Basophils Relative: 0.7 % (ref 0.0–3.0)
Eosinophils Absolute: 0.3 10*3/uL (ref 0.0–0.7)
Eosinophils Relative: 4 % (ref 0.0–5.0)
HCT: 40.8 % (ref 39.0–52.0)
Hemoglobin: 13.8 g/dL (ref 13.0–17.0)
Lymphocytes Relative: 22.3 % (ref 12.0–46.0)
Lymphs Abs: 1.9 10*3/uL (ref 0.7–4.0)
MCHC: 33.9 g/dL (ref 30.0–36.0)
MCV: 94.9 fl (ref 78.0–100.0)
Monocytes Absolute: 0.7 10*3/uL (ref 0.1–1.0)
Monocytes Relative: 8.5 % (ref 3.0–12.0)
Neutro Abs: 5.5 10*3/uL (ref 1.4–7.7)
Neutrophils Relative %: 64.5 % (ref 43.0–77.0)
Platelets: 208 10*3/uL (ref 150.0–400.0)
RBC: 4.3 Mil/uL (ref 4.22–5.81)
RDW: 12.8 % (ref 11.5–15.5)
WBC: 8.6 10*3/uL (ref 4.0–10.5)

## 2017-08-04 LAB — AST: AST: 16 U/L (ref 0–37)

## 2017-08-04 LAB — BASIC METABOLIC PANEL
BUN: 14 mg/dL (ref 6–23)
CO2: 27 mEq/L (ref 19–32)
Calcium: 8.9 mg/dL (ref 8.4–10.5)
Chloride: 96 mEq/L (ref 96–112)
Creatinine, Ser: 1.14 mg/dL (ref 0.40–1.50)
GFR: 68.51 mL/min (ref >60.00)
Glucose, Bld: 94 mg/dL (ref 70–99)
Potassium: 4.5 mEq/L (ref 3.5–5.1)
Sodium: 129 mEq/L — ABNORMAL LOW (ref 135–145)

## 2017-08-04 LAB — TSH: TSH: 3.22 u[IU]/mL (ref 0.35–4.50)

## 2017-08-04 LAB — ALT: ALT: 17 U/L (ref 0–53)

## 2017-08-04 LAB — HEMOGLOBIN A1C: Hgb A1c MFr Bld: 6.3 % (ref 4.6–6.5)

## 2017-08-04 NOTE — Progress Notes (Signed)
Subjective:    Patient ID: [name] [name], male    DOB: 07-27-1951, 66 y.o.   MRN: [id number]  DOS:  08/04/2017 Type of visit - description : cpx Interval history: In general feeling well. He remains active.  Review of Systems  Hammertoes still bother him sometimes but he decided not to proceed with any surgery Still have some nocturia but does not feel he needs any medication at this point.  Other than above, a 14 point review of systems is negative     Past Medical History:  Diagnosis Date  . 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 Colon polyps    Colon polyps at age 33, next Cscope age 11  . Diabetes mellitus   . DJD (degenerative joint disease)   . Hyperlipidemia     Past Surgical History:  Procedure Laterality Date  . BUNIONECTOMY Right 12/27/2014   [facility]  . NASAL SEPTUM SURGERY  2000  . SHOULDER ARTHROSCOPY     L 2011, R 2012    Social History   Social History  . Marital status: Married    Spouse name: N/A  . Number of children: 2  . Years of education: N/A   Occupational History  . disability [employer]   Social History Main Topics  . Smoking status: Current Some Day Smoker  . Smokeless tobacco: Never Used     Comment:    . Alcohol use 1.2 oz/week    2 Cans of beer per week     Comment: socially   . Drug use: No  . Sexual activity: Not on file   Other Topics Concern  . Not on file   Social History Narrative   Household-- pt and wife    Luz Lex a lot      Family History  Problem Relation Age of Onset  . Arthritis Mother   . Alzheimer's disease Mother   . Lung cancer Father   . Stroke Other   . Diabetes Other        uncle, brother (borderline)  . GER disease Sister   . Colon cancer Neg Hx   . Prostate cancer Neg Hx   . CAD Neg Hx      Allergies as of 08/04/2017   No Known Allergies     Medication List       Accurate as of 08/04/17 11:59 PM. Always use your most recent med list.          ACCU-CHEK AVIVA PLUS w/Device  Kit Use to check blood sugar   accu-chek soft touch lancets Check blood sugars no more than twice daily   aspirin 81 MG tablet Take 81 mg by mouth daily.   atorvastatin 40 MG tablet Commonly known as:  LIPITOR Take 1 tablet (40 mg total) by mouth daily.   fish oil-omega-3 fatty acids 1000 MG capsule Take 2 g by mouth daily.   glucose blood test strip Commonly known as:  ACCU-CHEK AVIVA Check blood sugar no more than twice daily   IBUPROFEN PO Take by mouth as needed. Reported on 01/27/2016   levothyroxine 75 MCG tablet Commonly known as:  SYNTHROID, LEVOTHROID Take 1 tablet (75 mcg total) by mouth daily before breakfast.   lisinopril 2.5 MG tablet Commonly known as:  PRINIVIL,ZESTRIL Take 1 tablet (2.5 mg total) by mouth daily.   metFORMIN 500 MG 24 hr tablet Commonly known as:  GLUCOPHAGE-XR Take 2 tablets (1,000 mg total) by mouth 2 (two) times daily.   multivitamin tablet Take  1 tablet by mouth daily.   mupirocin cream 2 % Commonly known as:  BACTROBAN Apply 1 application topically as needed. Reported on 01/27/2016   sildenafil 20 MG tablet Commonly known as:  REVATIO Take 2-3 tablets (40-60 mg total) by mouth daily as needed.   sitaGLIPtin 100 MG tablet Commonly known as:  JANUVIA Take 1 tablet (100 mg total) by mouth daily.   triamcinolone cream 0.1 % Commonly known as:  KENALOG          Objective:   Physical Exam BP 124/68 (BP Location: Left Arm, Patient Position: Sitting, Cuff Size: Normal)   Pulse 61   Temp 98.1 F (36.7 C) (Oral)   Resp 14   Ht '6\' 5"'  (1.956 m)   Wt 254 lb 4 oz (115.3 kg)   SpO2 97%   BMI 30.15 kg/m   General:   Well developed, well nourished . NAD.  Neck: No  thyromegaly  HEENT:  Normocephalic . Face symmetric, atraumatic Lungs:  CTA B Normal respiratory effort, no intercostal retractions, no accessory muscle use. Heart: RRR,  no murmur.  No pretibial edema bilaterally  Abdomen:  Not distended, soft, non-tender. No  rebound or rigidity.  No bruit Skin: Exposed areas without rash. Not pale. Not jaundice Neurologic:  alert & oriented X3.  Speech normal, gait appropriate for age and unassisted Strength symmetric and appropriate for age.  Psych: Cognition and judgment appear intact.  Cooperative with normal attention span and concentration.  Behavior appropriate. No anxious or depressed appearing.    Assessment & Plan:    Assessment DM- on ACEi Hyperlipidemia Hypothyroidism DJD Pre-glaucoma E.D. Chronic R foot pain, s/p surgery w/ podiatry 2016  PLAN: DM: On metformin, lisinopril, Januvia. Check a BMP and A1c Hyperlipidemia: Well-controlled per last FLP, check AST ALT Hypothyroidism: On Synthroid, checking labs Nocturia: He failed to Flomax before,also tried  Vesicare : didn't help. Does not like to try another medication, sxs are not bothersome at this time. RTC 6 months

## 2017-08-04 NOTE — Progress Notes (Signed)
Pre visit review using our clinic review tool, if applicable. No additional management support is needed unless otherwise documented below in the visit note. 

## 2017-08-04 NOTE — Assessment & Plan Note (Addendum)
-  Td 2013; Zostavax 11-2012; Pneumonia shot - 2015;  prevnar:2015; flu shot today. shingrix discussed  -Prostate cancer screening--DRE -PSA 2017; wnl -[facility]: Had a colonoscopy at age 66. No records.  Had another colonoscopy 08-2014, + polyps, repeat in 5 years. See report.  -Smoke almost a pack a day for 38 years, slow down significantly at age 14, now smoke rarely. + FH lung cancer, he smoke approximately 38 pack per year consequently he qualifies for lung cancer screening. He is in favor proceed. Will arrange -Diet and exercise discussed. -Labs: BMP, AST, ALT, CBC, A1c, TSH

## 2017-08-04 NOTE — Patient Instructions (Signed)
GO TO THE LAB : Get the blood work     GO TO THE FRONT DESK Schedule your next appointment for a routine visit in 6 months 

## 2017-08-05 NOTE — Addendum Note (Signed)
Addended byDamita Dunnings D on: 08/05/2017 01:46 PM   Modules accepted: Orders

## 2017-08-05 NOTE — Assessment & Plan Note (Signed)
DM: On metformin, lisinopril, Januvia. Check a BMP and A1c Hyperlipidemia: Well-controlled per last FLP, check AST ALT Hypothyroidism: On Synthroid, checking labs Nocturia: He failed to Flomax before,also tried  Vesicare : didn't help. Does not like to try another medication, sxs are not bothersome at this time. RTC 6 months

## 2017-08-27 ENCOUNTER — Other Ambulatory Visit (INDEPENDENT_AMBULATORY_CARE_PROVIDER_SITE_OTHER): Payer: Medicare Other

## 2017-08-27 DIAGNOSIS — E871 Hypo-osmolality and hyponatremia: Secondary | ICD-10-CM | POA: Diagnosis not present

## 2017-08-27 LAB — BASIC METABOLIC PANEL
BUN: 15 mg/dL (ref 6–23)
CO2: 29 mEq/L (ref 19–32)
Calcium: 8.9 mg/dL (ref 8.4–10.5)
Chloride: 106 mEq/L (ref 96–112)
Creatinine, Ser: 1.24 mg/dL (ref 0.40–1.50)
GFR: 62.16 mL/min (ref >60.00)
Glucose, Bld: 186 mg/dL — ABNORMAL HIGH (ref 70–99)
Potassium: 4 mEq/L (ref 3.5–5.1)
Sodium: 140 mEq/L (ref 135–145)

## 2017-10-12 ENCOUNTER — Other Ambulatory Visit: Payer: Self-pay | Admitting: Internal Medicine

## 2017-11-01 ENCOUNTER — Encounter: Payer: Self-pay | Admitting: Internal Medicine

## 2017-11-24 ENCOUNTER — Encounter: Payer: Self-pay | Admitting: Podiatry

## 2017-11-24 ENCOUNTER — Ambulatory Visit: Payer: Medicare Other | Admitting: Podiatry

## 2017-11-24 DIAGNOSIS — M21969 Unspecified acquired deformity of unspecified lower leg: Secondary | ICD-10-CM

## 2017-11-24 DIAGNOSIS — G8929 Other chronic pain: Secondary | ICD-10-CM

## 2017-11-24 DIAGNOSIS — M79672 Pain in left foot: Secondary | ICD-10-CM | POA: Diagnosis not present

## 2017-11-24 DIAGNOSIS — M21619 Bunion of unspecified foot: Secondary | ICD-10-CM | POA: Diagnosis not present

## 2017-11-24 NOTE — Progress Notes (Signed)
SUBJECTIVE: 67 y.o. year old male presents requesting left foot bunion surgery. Has had right foot surgery in 2016 and has done well without complications. Diabetic for 15 years. A1c was 6.3 on 08/04/17.  S/P Austin bunionectomy right March 2016.  Review of Systems  Respiratory: Negative.   Cardiovascular: Negative.   Gastrointestinal: Negative.  Negative for abdominal pain, constipation, diarrhea, heartburn, nausea and vomiting.  Musculoskeletal: Negative for back pain, joint pain and neck pain.       Two shoulder surgery in 2011 and 2012.     OBJECTIVE: DERMATOLOGIC EXAMINATION: Hypertrophic nails x 10. Well healed old incision line over the first metatarsal head right. VASCULAR EXAMINATION OF LOWER LIMBS: All pedal pulses are palpable with normal pulsation.  Capillary Filling times within 3 seconds in all digits.  No edema or erythema noted. Temperature gradient from tibial crest to dorsum of foot is within normal bilateral.  NEUROLOGIC EXAMINATION OF THE LOWER LIMBS: All epicritic and tactile sensations grossly intact. Sharp and Dull discriminatory sensations at the plantar ball of hallux is intact bilateral.   MUSCULOSKELETAL EXAMINATION: Positive for long and elevated first metatarsal bone with dorsal spur left foot. Limited joint motion 1st MPJ with weight bearing left. Post surgical right foot first MPJ pain free with limited joint motion.   Pre vious RADIOGRAPHIC STUDY of the left foot reveal;  Elevated and long first metatarsal bone with narrowing of joint space and dorsal spur at metatarsal head left foot.  ASSESSMENT: Painful bunion left foot. Long and elevated first metatarsal left. Controlled diabetic.  PLAN: Reviewed findings and available treatment options. Surgery consent form reviewed for Biplane Austin bunionectomy left foot.

## 2017-11-24 NOTE — Patient Instructions (Signed)
Seen for painful bunion left foot. Surgery consent form was reviewed for Biplane [place] bunionectomy left foot.

## 2017-11-29 ENCOUNTER — Telehealth: Payer: Self-pay

## 2017-11-29 NOTE — Telephone Encounter (Signed)
Pt is scheduled for procedure on 12/03/17 and would like to know is he should stop taking any medications before his procedure. He is specifically concerned about the aspirin. Please advise.

## 2017-11-29 NOTE — Telephone Encounter (Signed)
Called [employer] to get benefits for [name] [name] bunionectomy. Procedure is covered at 100% with no deductible. $340.00 copayment for the facility. Pt scheduled for surgery 12/03/17.

## 2017-12-02 ENCOUNTER — Other Ambulatory Visit: Payer: Self-pay | Admitting: Podiatry

## 2017-12-02 MED ORDER — HYDROCODONE-IBUPROFEN 7.5-200 MG PO TABS: 1.0000 | ORAL_TABLET | Freq: Four times a day (QID) | ORAL | 0 refills | Status: DC | PRN

## 2017-12-03 DIAGNOSIS — M2012 Hallux valgus (acquired), left foot: Secondary | ICD-10-CM | POA: Diagnosis not present

## 2017-12-08 ENCOUNTER — Ambulatory Visit (INDEPENDENT_AMBULATORY_CARE_PROVIDER_SITE_OTHER): Payer: Medicare Other | Admitting: Podiatry

## 2017-12-08 DIAGNOSIS — Z9889 Other specified postprocedural states: Secondary | ICD-10-CM

## 2017-12-08 NOTE — Patient Instructions (Signed)
1 week post op wound healing normal without complication. Continue with light ambulation and stay in post op shoe. Return in one week.

## 2017-12-09 ENCOUNTER — Encounter: Payer: Self-pay | Admitting: Podiatry

## 2017-12-09 NOTE — Progress Notes (Signed)
5 day post op following Biplane [place] bunionectomy left foot, 12/03/17. Patient denies having chills or fever, or calf tenderness. Taking pain medication as directed.  Objective: Surgical dressing is clean and well maintained. Surgical wound is clean and dry. No edema or erythema noted.  Assessment: Satisfactory wound healing with maintained correction  without complications. Plan: Dressing change done. Continue with surgical shoes and ambulate as tolerated. Return in one week.

## 2017-12-15 ENCOUNTER — Ambulatory Visit (INDEPENDENT_AMBULATORY_CARE_PROVIDER_SITE_OTHER): Payer: Medicare Other | Admitting: Podiatry

## 2017-12-15 DIAGNOSIS — M21619 Bunion of unspecified foot: Secondary | ICD-10-CM

## 2017-12-15 DIAGNOSIS — M21969 Unspecified acquired deformity of unspecified lower leg: Secondary | ICD-10-CM

## 2017-12-15 NOTE — Patient Instructions (Signed)
2 weeks post op wound healing normal. Suture removed. Normal x-ray findings noted. Continue in post op shoe. Return in 2 weeks.

## 2017-12-16 ENCOUNTER — Encounter: Payer: Self-pay | Admitting: Podiatry

## 2017-12-16 NOTE — Progress Notes (Signed)
2 weeks post op wound healing normal following Biplane Austin bunionectomy left foot, 12/03/17. Denies any discomfort. Suture removed. Noted of well approximated skin edges. Post op x-ray show normal findings. Good bone to bone contact with internal screw fixation in place. Wound dressed with Myfex tape with home care instruction. Continue with surgical shoe and limited ambulation. Return in 2 weeks.

## 2017-12-20 DIAGNOSIS — H35341 Macular cyst, hole, or pseudohole, right eye: Secondary | ICD-10-CM | POA: Diagnosis not present

## 2017-12-20 DIAGNOSIS — E119 Type 2 diabetes mellitus without complications: Secondary | ICD-10-CM | POA: Diagnosis not present

## 2017-12-21 DIAGNOSIS — D2261 Melanocytic nevi of right upper limb, including shoulder: Secondary | ICD-10-CM | POA: Diagnosis not present

## 2017-12-21 DIAGNOSIS — D225 Melanocytic nevi of trunk: Secondary | ICD-10-CM | POA: Diagnosis not present

## 2017-12-21 DIAGNOSIS — L578 Other skin changes due to chronic exposure to nonionizing radiation: Secondary | ICD-10-CM | POA: Diagnosis not present

## 2017-12-21 DIAGNOSIS — D2361 Other benign neoplasm of skin of right upper limb, including shoulder: Secondary | ICD-10-CM | POA: Diagnosis not present

## 2017-12-21 DIAGNOSIS — D2262 Melanocytic nevi of left upper limb, including shoulder: Secondary | ICD-10-CM | POA: Diagnosis not present

## 2017-12-21 DIAGNOSIS — L570 Actinic keratosis: Secondary | ICD-10-CM | POA: Diagnosis not present

## 2017-12-29 ENCOUNTER — Ambulatory Visit (INDEPENDENT_AMBULATORY_CARE_PROVIDER_SITE_OTHER): Payer: Medicare Other | Admitting: Podiatry

## 2017-12-29 ENCOUNTER — Encounter: Payer: Self-pay | Admitting: Podiatry

## 2017-12-29 DIAGNOSIS — Z9889 Other specified postprocedural states: Secondary | ICD-10-CM

## 2017-12-29 NOTE — Progress Notes (Signed)
4 weeks S/P Biplane Austin bunionectomy left foot, 12/03/17. Denies any problem. Well approximated skin edges. No edema or erythema noted. May start using regular shoe as tolerated. Tube foam gauze placed over the incision area. Return in 3 weeks.

## 2017-12-29 NOTE — Patient Instructions (Signed)
4 weeks post op left foot surgery doing well. May try regular shoe as tolerated. Return in 3 weeks.

## 2018-01-20 ENCOUNTER — Encounter: Payer: Self-pay | Admitting: Podiatry

## 2018-01-20 ENCOUNTER — Ambulatory Visit (INDEPENDENT_AMBULATORY_CARE_PROVIDER_SITE_OTHER): Payer: Medicare Other | Admitting: Podiatry

## 2018-01-20 DIAGNOSIS — Z9889 Other specified postprocedural states: Secondary | ICD-10-CM

## 2018-01-20 NOTE — Progress Notes (Signed)
7 weeks S/P Biplane Austin bunionectomy left foot, 12/03/17. Got stiffened again a couple of days ago. Continue with regular activity as tolerated. Return as needed.

## 2018-01-20 NOTE — Patient Instructions (Signed)
7 weeks S/P Biplane [place] bunionectomy left foot, 12/03/17. Got stiffened again a couple of days ago. Continue with joint motion and regular activity as tolerated. Return as needed.

## 2018-01-26 ENCOUNTER — Encounter: Payer: Self-pay | Admitting: Internal Medicine

## 2018-01-28 ENCOUNTER — Encounter: Payer: Self-pay | Admitting: Internal Medicine

## 2018-01-28 ENCOUNTER — Ambulatory Visit (INDEPENDENT_AMBULATORY_CARE_PROVIDER_SITE_OTHER): Payer: Medicare Other | Admitting: Internal Medicine

## 2018-01-28 VITALS — BP 128/70 | HR 56 | Temp 97.5°F | Resp 14 | Ht 77.0 in | Wt 257.5 lb

## 2018-01-28 DIAGNOSIS — E782 Mixed hyperlipidemia: Secondary | ICD-10-CM | POA: Diagnosis not present

## 2018-01-28 DIAGNOSIS — E0800 Diabetes mellitus due to underlying condition with hyperosmolarity without nonketotic hyperglycemic-hyperosmolar coma (NKHHC): Secondary | ICD-10-CM | POA: Diagnosis not present

## 2018-01-28 DIAGNOSIS — E039 Hypothyroidism, unspecified: Secondary | ICD-10-CM | POA: Diagnosis not present

## 2018-01-28 LAB — BASIC METABOLIC PANEL
BUN: 16 mg/dL (ref 6–23)
CO2: 27 mEq/L (ref 19–32)
Calcium: 8.8 mg/dL (ref 8.4–10.5)
Chloride: 103 mEq/L (ref 96–112)
Creatinine, Ser: 1.25 mg/dL (ref 0.40–1.50)
GFR: 61.51 mL/min (ref >60.00)
Glucose, Bld: 95 mg/dL (ref 70–99)
Potassium: 3.9 mEq/L (ref 3.5–5.1)
Sodium: 137 mEq/L (ref 135–145)

## 2018-01-28 LAB — LIPID PANEL
Cholesterol: 129 mg/dL (ref 0–200)
HDL: 48.2 mg/dL (ref >39.00)
LDL Cholesterol: 60 mg/dL (ref 0–99)
NonHDL: 80.6
Total CHOL/HDL Ratio: 3
Triglycerides: 102 mg/dL (ref 0.0–149.0)
VLDL: 20.4 mg/dL (ref 0.0–40.0)

## 2018-01-28 LAB — TSH: TSH: 3.6 u[IU]/mL (ref 0.35–4.50)

## 2018-01-28 LAB — HEMOGLOBIN A1C: Hgb A1c MFr Bld: 6.4 % (ref 4.6–6.5)

## 2018-01-28 NOTE — Progress Notes (Signed)
Pre visit review using our clinic review tool, if applicable. No additional management support is needed unless otherwise documented below in the visit note. 

## 2018-01-28 NOTE — Progress Notes (Signed)
Subjective:    Patient ID: [name] [name], male    DOB: 24-Mar-1951, 67 y.o.   MRN: [id number]  DOS:  01/28/2018 Type of visit - description : rov Interval history:  Feels well S/p L foot surgery, recuperating well, no calf swelling Labs and med list reviewed.  Good compliance with no apparent side effects  Review of Systems   Past Medical History:  Diagnosis Date  . 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 Colon polyps    Colon polyps at age 63, next Cscope age 41  . Diabetes mellitus   . DJD (degenerative joint disease)   . Hyperlipidemia     Past Surgical History:  Procedure Laterality Date  . BUNIONECTOMY Right 12/27/2014   [facility]  . NASAL SEPTUM SURGERY  2000  . SHOULDER ARTHROSCOPY     L 2011, R 2012    Social History   Socioeconomic History  . Marital status: Married    Spouse name: Not on file  . Number of children: 2  . Years of education: Not on file  . Highest education level: Not on file  Occupational History  . Occupation: disability    Employer: [employer]  . Financial resource strain: Not on file  . Food insecurity:    Worry: Not on file    Inability: Not on file  . Transportation needs:    Medical: Not on file    Non-medical: Not on file  Tobacco Use  . Smoking status: Current Some Day Smoker  . Smokeless tobacco: Never Used  . Tobacco comment:    Substance and Sexual Activity  . Alcohol use: Yes    Alcohol/week: 1.2 oz    Types: 2 Cans of beer per week    Comment: socially   . Drug use: No  . Sexual activity: Not on file  Lifestyle  . Physical activity:    Days per week: Not on file    Minutes per session: Not on file  . Stress: Not on file  Relationships  . Social connections:    Talks on phone: Not on file    Gets together: Not on file    Attends religious service: Not on file    Active member of club or organization: Not on file    Attends meetings of clubs or organizations: Not on file    Relationship status:  Not on file  . Intimate partner violence:    Fear of current or ex partner: Not on file    Emotionally abused: Not on file    Physically abused: Not on file    Forced sexual activity: Not on file  Other Topics Concern  . Not on file  Social History Narrative   Household-- pt and wife    Luz Lex a lot       Allergies as of 01/28/2018   No Known Allergies     Medication List        Accurate as of 01/28/18  9:17 AM. Always use your most recent med list.          ACCU-CHEK AVIVA PLUS w/Device Kit Use to check blood sugar   accu-chek soft touch lancets Check blood sugars no more than twice daily   aspirin 81 MG tablet Take 81 mg by mouth daily.   atorvastatin 40 MG tablet Commonly known as:  LIPITOR Take 1 tablet (40 mg total) by mouth daily.   fish oil-omega-3 fatty acids 1000 MG capsule Take 2 g by mouth  daily.   glucose blood test strip Commonly known as:  ACCU-CHEK AVIVA Check blood sugar no more than twice daily   HYDROcodone-ibuprofen 7.5-200 MG tablet Commonly known as:  VICOPROFEN Take 1 tablet by mouth every 6 (six) hours as needed for moderate pain.   IBUPROFEN PO Take by mouth as needed. Reported on 01/27/2016   levothyroxine 75 MCG tablet Commonly known as:  SYNTHROID, LEVOTHROID Take 1 tablet (75 mcg total) by mouth daily before breakfast.   lisinopril 2.5 MG tablet Commonly known as:  PRINIVIL,ZESTRIL Take 1 tablet (2.5 mg total) by mouth daily.   metFORMIN 500 MG 24 hr tablet Commonly known as:  GLUCOPHAGE-XR Take 2 tablets (1,000 mg total) by mouth 2 (two) times daily.   multivitamin tablet Take 1 tablet by mouth daily.   mupirocin cream 2 % Commonly known as:  BACTROBAN Apply 1 application topically as needed. Reported on 01/27/2016   sildenafil 20 MG tablet Commonly known as:  REVATIO Take 2-3 tablets (40-60 mg total) by mouth daily as needed.   sitaGLIPtin 100 MG tablet Commonly known as:  JANUVIA Take 1 tablet (100 mg total) by  mouth daily.   triamcinolone cream 0.1 % Commonly known as:  KENALOG          Objective:   Physical Exam BP 128/70 (BP Location: Left Arm, Patient Position: Sitting, Cuff Size: Normal)   Pulse (!) 56   Temp (!) 97.5 F (36.4 C) (Oral)   Resp 14   Ht '6\' 5"'  (1.956 m)   Wt 257 lb 8 oz (116.8 kg)   SpO2 95%   BMI 30.54 kg/m  General:   Well developed, well nourished . NAD.  HEENT:  Normocephalic . Face symmetric, atraumatic Lungs:  CTA B Normal respiratory effort, no intercostal retractions, no accessory muscle use. Heart: RRR,  no murmur.  No pretibial edema bilaterally  Skin: Not pale. Not jaundice DIABETIC FEET EXAM: No lower extremity edema Normal pedal pulses bilaterally Skin normal, nails normal, no calluses Pinprick examination of the feet normal. + Surgical scars noted.  Mild swelling at the base of the left toe. Neurologic:  alert & oriented X3.  Speech normal, gait appropriate for age and unassisted Psych--  Cognition and judgment appear intact.  Cooperative with normal attention span and concentration.  Behavior appropriate. No anxious or depressed appearing.      Assessment & Plan:    Assessment DM- on ACEi Hyperlipidemia Hypothyroidism DJD Pre-glaucoma E.D. Chronic R foot pain, s/p surgery w/ podiatry 2016  PLAN: DM: Seems to be doing well, CBGs when checked at home "WNL".  Continue lisinopril, metformin, Januvia.  Check a BMP and A1c Hyperlipidemia: Continue Lipitor, check a FLP, last LFTs normal Hypothyroidism: On Synthroid, check a TSH Recovering well from left foot surgery. RTC 6 months CPX

## 2018-01-28 NOTE — Patient Instructions (Signed)
GO TO THE LAB : Get the blood work     GO TO THE FRONT DESK Schedule your next appointment for a  physical exam in 6 months  

## 2018-01-29 NOTE — Assessment & Plan Note (Signed)
DM: Seems to be doing well, CBGs when checked at home "WNL".  Continue lisinopril, metformin, Januvia.  Check a BMP and A1c Hyperlipidemia: Continue Lipitor, check a FLP, last LFTs normal Hypothyroidism: On Synthroid, check a TSH Recovering well from left foot surgery. RTC 6 months CPX

## 2018-01-31 ENCOUNTER — Ambulatory Visit (INDEPENDENT_AMBULATORY_CARE_PROVIDER_SITE_OTHER): Payer: Medicare Other | Admitting: *Deleted

## 2018-01-31 ENCOUNTER — Encounter: Payer: Self-pay | Admitting: *Deleted

## 2018-01-31 VITALS — BP 132/70 | HR 59 | Ht 77.0 in | Wt 254.0 lb

## 2018-01-31 DIAGNOSIS — Z0000 Encounter for general adult medical examination without abnormal findings: Secondary | ICD-10-CM

## 2018-01-31 NOTE — Patient Instructions (Signed)
Follow up with Dr.Paz as directed  Continue to eat heart healthy diet (full of fruits, vegetables, whole grains, lean protein, water--limit salt, fat, and sugar intake) and increase physical activity as tolerated.  Continue doing brain stimulating activities (puzzles, reading, adult coloring books, staying active) to keep memory sharp.   Bring a copy of your living will and/or healthcare power of attorney to your next office visit.  Please call us if we can assist you with stopping smoking in anyway.   Mr. [name] [name] , Thank you for taking time to come for your Medicare Wellness Visit. I appreciate your ongoing commitment to your health goals. Please review the following plan we discussed and let me know if I can assist you in the future.   These are the goals we discussed: Goals    . [place] (pt-stated)    . Quit Smoking       This is a list of the screening recommended for you and due dates:  Health Maintenance  Topic Date Due  . Flu Shot  05/26/2018  . Hemoglobin A1C  07/30/2018  . Pneumonia vaccines (2 of 2 - PPSV23) 10/30/2018  . Eye exam for diabetics  12/19/2018  . Complete foot exam   01/29/2019  . Colon Cancer Screening  09/06/2019  . Tetanus Vaccine  08/04/2022  .  Hepatitis C: One time screening is recommended by Center for Disease Control  (CDC) for  adults born from [birth year] through 1964.   Completed  . HIV Screening  Completed    Coping with Quitting Smoking Quitting smoking is a physical and mental challenge. You will face cravings, withdrawal symptoms, and temptation. Before quitting, work with your health care provider to make a plan that can help you cope. Preparation can help you quit and keep you from giving in. How can I cope with cravings? Cravings usually last for 5-10 minutes. If you get through it, the craving will pass. Consider taking the following actions to help you cope with cravings:  Keep your mouth busy: ? Chew sugar-free gum. ? Suck  on hard candies or a straw. ? Brush your teeth.  Keep your hands and body busy: ? Immediately change to a different activity when you feel a craving. ? Squeeze or play with a ball. ? Do an activity or a hobby, like making bead jewelry, practicing needlepoint, or working with wood. ? Mix up your normal routine. ? Take a short exercise break. Go for a quick walk or run up and down stairs. ? Spend time in public places where smoking is not allowed.  Focus on doing something kind or helpful for someone else.  Call a friend or family member to talk during a craving.  Join a support group.  Call a quit line, such as 1-800-QUIT-NOW.  Talk with your health care provider about medicines that might help you cope with cravings and make quitting easier for you.  How can I deal with withdrawal symptoms? Your body may experience negative effects as it tries to get used to not having nicotine in the system. These effects are called withdrawal symptoms. They may include:  Feeling hungrier than normal.  Trouble concentrating.  Irritability.  Trouble sleeping.  Feeling depressed.  Restlessness and agitation.  Craving a cigarette.  To manage withdrawal symptoms:  Avoid places, people, and activities that trigger your cravings.  Remember why you want to quit.  Get plenty of sleep.  Avoid coffee and other caffeinated drinks. These may worsen some  of your symptoms.  How can I handle social situations? Social situations can be difficult when you are quitting smoking, especially in the first few weeks. To manage this, you can:  Avoid parties, bars, and other social situations where people might be smoking.  Avoid alcohol.  Leave right away if you have the urge to smoke.  Explain to your family and friends that you are quitting smoking. Ask for understanding and support.  Plan activities with friends or family where smoking is not an option.  What are some ways I can cope with  stress? Wanting to smoke may cause stress, and stress can make you want to smoke. Find ways to manage your stress. Relaxation techniques can help. For example:  Breathe slowly and deeply, in through your nose and out through your mouth.  Listen to soothing, relaxing music.  Talk with a family member or friend about your stress.  Light a candle.  Soak in a bath or take a shower.  Think about a peaceful place.  What are some ways I can prevent weight gain? Be aware that many people gain weight after they quit smoking. However, not everyone does. To keep from gaining weight, have a plan in place before you quit and stick to the plan after you quit. Your plan should include:  Having healthy snacks. When you have a craving, it may help to: ? Eat plain popcorn, crunchy carrots, celery, or other cut vegetables. ? Chew sugar-free gum.  Changing how you eat: ? Eat small portion sizes at meals. ? Eat 4-6 small meals throughout the day instead of 1-2 large meals a day. ? Be mindful when you eat. Do not watch television or do other things that might distract you as you eat.  Exercising regularly: ? Make time to exercise each day. If you do not have time for a long workout, do short bouts of exercise for 5-10 minutes several times a day. ? Do some form of strengthening exercise, like weight lifting, and some form of aerobic exercise, like running or swimming.  Drinking plenty of water or other low-calorie or no-calorie drinks. Drink 6-8 glasses of water daily, or as much as instructed by your health care provider.  Summary  Quitting smoking is a physical and mental challenge. You will face cravings, withdrawal symptoms, and temptation to smoke again. Preparation can help you as you go through these challenges.  You can cope with cravings by keeping your mouth busy (such as by chewing gum), keeping your body and hands busy, and making calls to family, friends, or a helpline for people who want  to quit smoking.  You can cope with withdrawal symptoms by avoiding places where people smoke, avoiding drinks with caffeine, and getting plenty of rest.  Ask your health care provider about the different ways to prevent weight gain, avoid stress, and handle social situations. This information is not intended to replace advice given to you by your health care provider. Make sure you discuss any questions you have with your health care provider. Document Released: 10/09/2016 Document Revised: 10/09/2016 Document Reviewed: 10/09/2016 Elsevier Interactive Patient Education  [employer].

## 2018-01-31 NOTE — Progress Notes (Addendum)
Subjective:   [name] [name] is a 67 y.o. male who presents for Medicare Annual/Subsequent preventive examination.  Review of Systems:  No ROS.  Medicare Wellness Visit. Additional risk factors are reflected in the social history. Cardiac Risk Factors include: advanced age (>12mn, >>86women);diabetes mellitus;dyslipidemia;male gender   Sleep patterns: Trouble sleeping due to shoulder pain so sleeps most of the time in the recliner.Gets 6-8 hrs. Home Safety/Smoke Alarms: Feels safe in home. Smoke alarms in place.  Living environment; residence and Firearm Safety: Lives with wife. 2 story home. No issues with stairs. Seat Belt Safety/Bike Helmet: Wears seat belt.  Male:   CCS-   Utd. Due 2020  PSA-  Lab Results  Component Value Date   PSA 1.12 07/28/2016   PSA 1.05 10/31/2014   PSA 0.67 08/04/2012       Objective:    Vitals: BP 132/70 (BP Location: Left Arm, Patient Position: Sitting, Cuff Size: Normal)   Pulse (!) 59   Ht '6\' 5"'  (1.956 m)   Wt 254 lb (115.2 kg)   SpO2 98%   BMI 30.12 kg/m   Body mass index is 30.12 kg/m.  Advanced Directives 01/31/2018 01/28/2017  Does Patient Have a Medical Advance Directive? No No  Would patient like information on creating a medical advance directive? No - Patient declined Yes (MAU/Ambulatory/Procedural Areas - Information given)    Tobacco Social History   Tobacco Use  Smoking Status Current Some Day Smoker  Smokeless Tobacco Never Used  Tobacco Comment         Ready to quit: Not Answered Counseling given: Not Answered Comment:     Clinical Intake: Pain : No/denies pain    Past Medical History:  Diagnosis Date  . 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 Colon polyps    Colon polyps at age 67 next Cscope age 67 . Diabetes mellitus   . DJD (degenerative joint disease)   . Hyperlipidemia    Past Surgical History:  Procedure Laterality Date  . BUNIONECTOMY Right 12/27/2014   [facility]  . NASAL SEPTUM SURGERY  2000    . SHOULDER ARTHROSCOPY     L 2011, R 2012   Family History  Problem Relation Age of Onset  . Arthritis Mother   . Alzheimer's disease Mother   . Lung cancer Father   . Stroke Other   . Diabetes Other        uncle, brother (borderline)  . GER disease Sister   . Colon cancer Neg Hx   . Prostate cancer Neg Hx   . CAD Neg Hx    Social History   Socioeconomic History  . Marital status: Married    Spouse name: Not on file  . Number of children: 2  . Years of education: Not on file  . Highest education level: Not on file  Occupational History  . Occupation: disability    Employer: [employer] . Financial resource strain: Not on file  . Food insecurity:    Worry: Not on file    Inability: Not on file  . Transportation needs:    Medical: Not on file    Non-medical: Not on file  Tobacco Use  . Smoking status: Current Some Day Smoker  . Smokeless tobacco: Never Used  . Tobacco comment:    Substance and Sexual Activity  . Alcohol use: Yes    Alcohol/week: 1.2 oz    Types: 2 Cans of beer per week    Comment:  socially   . Drug use: No  . Sexual activity: Yes  Lifestyle  . Physical activity:    Days per week: Not on file    Minutes per session: Not on file  . Stress: Not on file  Relationships  . Social connections:    Talks on phone: Not on file    Gets together: Not on file    Attends religious service: Not on file    Active member of club or organization: Not on file    Attends meetings of clubs or organizations: Not on file    Relationship status: Not on file  Other Topics Concern  . Not on file  Social History Narrative   Household-- pt and wife    [name] [name] a lot     Outpatient Encounter Medications as of 01/31/2018  Medication Sig  . aspirin 81 MG tablet Take 81 mg by mouth daily.  Marland Kitchen atorvastatin (LIPITOR) 40 MG tablet Take 1 tablet (40 mg total) by mouth daily.  . Blood Glucose Monitoring Suppl (ACCU-CHEK AVIVA PLUS) w/Device KIT Use to check  blood sugar  . fish oil-omega-3 fatty acids 1000 MG capsule Take 2 g by mouth daily.  Marland Kitchen glucose blood (ACCU-CHEK AVIVA) test strip Check blood sugar no more than twice daily  . HYDROcodone-ibuprofen (VICOPROFEN) 7.5-200 MG tablet Take 1 tablet by mouth every 6 (six) hours as needed for moderate pain.  . IBUPROFEN PO Take by mouth as needed. Reported on 01/27/2016  . Lancets (ACCU-CHEK SOFT TOUCH) lancets Check blood sugars no more than twice daily  . levothyroxine (SYNTHROID, LEVOTHROID) 75 MCG tablet Take 1 tablet (75 mcg total) by mouth daily before breakfast.  . lisinopril (PRINIVIL,ZESTRIL) 2.5 MG tablet Take 1 tablet (2.5 mg total) by mouth daily.  . metFORMIN (GLUCOPHAGE-XR) 500 MG 24 hr tablet Take 2 tablets (1,000 mg total) by mouth 2 (two) times daily.  . Multiple Vitamin (MULTIVITAMIN) tablet Take 1 tablet by mouth daily.  . mupirocin cream (BACTROBAN) 2 % Apply 1 application topically as needed. Reported on 01/27/2016  . sildenafil (REVATIO) 20 MG tablet Take 2-3 tablets (40-60 mg total) by mouth daily as needed.  . sitaGLIPtin (JANUVIA) 100 MG tablet Take 1 tablet (100 mg total) by mouth daily.  Marland Kitchen triamcinolone cream (KENALOG) 0.1 %    No facility-administered encounter medications on file as of 01/31/2018.     Activities of Daily Living In your present state of health, do you have any difficulty performing the following activities: 01/31/2018  Hearing? N  Vision? N  Comment wears glasses. Dr.Peter Dunn every 6 months.  Difficulty concentrating or making decisions? N  Walking or climbing stairs? N  Dressing or bathing? N  Doing errands, shopping? N  Preparing Food and eating ? N  Using the Toilet? N  In the past six months, have you accidently leaked urine? N  Do you have problems with loss of bowel control? N  Managing your Medications? N  Managing your Finances? N  Housekeeping or managing your Housekeeping? N  Some recent data might be hidden    Patient Care Team: Colon Branch, MD as PCP - General (Internal Medicine) Idolina Primer Warnell Bureau (Optometry) Jarome Matin, MD as Consulting Physician (Dermatology)   Assessment:   This is a routine wellness examination for [name] [name]. Physical assessment deferred to PCP.  Exercise Activities and Dietary recommendations Current Exercise Habits: The patient does not participate in regular exercise at present, Exercise limited by: None identified Diet (meal preparation, eat  out, water intake, caffeinated beverages, dairy products, fruits and vegetables): pt is eating a lot of carbs and sugar.   Goals    . [place] (pt-stated)    . Quit Smoking       Fall Risk Fall Risk  01/31/2018 01/28/2017 01/27/2016 08/07/2015 10/30/2013  Falls in the past year? No No No No No    Depression Screen PHQ 2/9 Scores 01/31/2018 01/28/2017 01/27/2016 08/07/2015  PHQ - 2 Score 0 0 0 0    Cognitive Function MMSE - Mini Mental State Exam 01/31/2018 01/28/2017  Orientation to time 5 5  Orientation to Place 5 5  Registration 3 3  Attention/ Calculation 5 5  Recall 3 2  Language- name 2 objects 2 2  Language- repeat 1 1  Language- follow 3 step command 3 3  Language- read & follow direction 1 1  Write a sentence 1 1  Copy design 1 1  Total score 30 29        Immunization History  Administered Date(s) Administered  . Influenza Split 08/04/2012, 07/27/2014  . Influenza, High Dose Seasonal PF 08/04/2017  . Influenza,inj,Quad PF,6+ Mos 08/15/2013, 08/07/2015, 07/28/2016  . Pneumococcal Conjugate-13 04/30/2014  . Pneumococcal Polysaccharide-23 10/30/2013  . Tdap 08/04/2012  . Zoster 12/05/2012    Screening Tests Health Maintenance  Topic Date Due  . INFLUENZA VACCINE  05/26/2018  . HEMOGLOBIN A1C  07/30/2018  . PNA vac Low Risk Adult (2 of 2 - PPSV23) 10/30/2018  . OPHTHALMOLOGY EXAM  12/19/2018  . FOOT EXAM  01/29/2019  . COLONOSCOPY  09/06/2019  . TETANUS/TDAP  08/04/2022  . Hepatitis C Screening  Completed  . HIV Screening   Completed      Plan:   Follow up with Dr.Paz as directed  Continue to eat heart healthy diet (full of fruits, vegetables, whole grains, lean protein, water--limit salt, fat, and sugar intake) and increase physical activity as tolerated.  Continue doing brain stimulating activities (puzzles, reading, adult coloring books, staying active) to keep memory sharp.   Bring a copy of your living will and/or healthcare power of attorney to your next office visit.  Please call us if we can assist you with stopping smoking in anyway.  I have personally reviewed and noted the following in the patient's chart:   . Medical and social history . Use of alcohol, tobacco or illicit drugs  . Current medications and supplements . Functional ability and status . Nutritional status . Physical activity . Advanced directives . List of other physicians . Hospitalizations, surgeries, and ER visits in previous 12 months . Vitals . Screenings to include cognitive, depression, and falls . Referrals and appointments  In addition, I have reviewed and discussed with patient certain preventive protocols, quality metrics, and best practice recommendations. A written personalized care plan for preventive services as well as general preventive health recommendations were provided to patient.     Naaman Plummer [place], [place]  01/31/2018 Kathlene November, MD

## 2018-02-02 ENCOUNTER — Ambulatory Visit: Payer: Medicare Other | Admitting: Internal Medicine

## 2018-03-21 ENCOUNTER — Other Ambulatory Visit: Payer: Self-pay | Admitting: Internal Medicine

## 2018-06-21 DIAGNOSIS — H40013 Open angle with borderline findings, low risk, bilateral: Secondary | ICD-10-CM | POA: Diagnosis not present

## 2018-06-28 ENCOUNTER — Encounter: Payer: Self-pay | Admitting: Internal Medicine

## 2018-07-22 DIAGNOSIS — L570 Actinic keratosis: Secondary | ICD-10-CM | POA: Diagnosis not present

## 2018-07-27 ENCOUNTER — Telehealth: Payer: Self-pay | Admitting: *Deleted

## 2018-07-27 NOTE — Telephone Encounter (Signed)
Received Dermatopathology Report results from [facility]; forwarded to provider/SLS 10/02

## 2018-08-08 ENCOUNTER — Encounter: Payer: Self-pay | Admitting: Internal Medicine

## 2018-08-08 ENCOUNTER — Ambulatory Visit (INDEPENDENT_AMBULATORY_CARE_PROVIDER_SITE_OTHER): Payer: Medicare Other | Admitting: Internal Medicine

## 2018-08-08 VITALS — BP 142/68 | HR 48 | Temp 97.8°F | Resp 16 | Ht 77.0 in | Wt 256.2 lb

## 2018-08-08 DIAGNOSIS — Z0000 Encounter for general adult medical examination without abnormal findings: Secondary | ICD-10-CM

## 2018-08-08 DIAGNOSIS — E039 Hypothyroidism, unspecified: Secondary | ICD-10-CM | POA: Diagnosis not present

## 2018-08-08 DIAGNOSIS — F17210 Nicotine dependence, cigarettes, uncomplicated: Secondary | ICD-10-CM

## 2018-08-08 DIAGNOSIS — Z122 Encounter for screening for malignant neoplasm of respiratory organs: Secondary | ICD-10-CM

## 2018-08-08 DIAGNOSIS — Z23 Encounter for immunization: Secondary | ICD-10-CM

## 2018-08-08 LAB — CBC WITH DIFFERENTIAL/PLATELET
Basophils Absolute: 0 10*3/uL (ref 0.0–0.1)
Basophils Relative: 0.6 % (ref 0.0–3.0)
Eosinophils Absolute: 0.4 10*3/uL (ref 0.0–0.7)
Eosinophils Relative: 5.3 % — ABNORMAL HIGH (ref 0.0–5.0)
HCT: 41.5 % (ref 39.0–52.0)
Hemoglobin: 14.1 g/dL (ref 13.0–17.0)
Lymphocytes Relative: 29.1 % (ref 12.0–46.0)
Lymphs Abs: 2.4 10*3/uL (ref 0.7–4.0)
MCHC: 34.1 g/dL (ref 30.0–36.0)
MCV: 95 fl (ref 78.0–100.0)
Monocytes Absolute: 0.7 10*3/uL (ref 0.1–1.0)
Monocytes Relative: 8.7 % (ref 3.0–12.0)
Neutro Abs: 4.6 10*3/uL (ref 1.4–7.7)
Neutrophils Relative %: 56.3 % (ref 43.0–77.0)
Platelets: 206 10*3/uL (ref 150.0–400.0)
RBC: 4.36 Mil/uL (ref 4.22–5.81)
RDW: 13.1 % (ref 11.5–15.5)
WBC: 8.2 10*3/uL (ref 4.0–10.5)

## 2018-08-08 LAB — COMPREHENSIVE METABOLIC PANEL
ALT: 12 U/L (ref 0–53)
AST: 11 U/L (ref 0–37)
Albumin: 3.9 g/dL (ref 3.5–5.2)
Alkaline Phosphatase: 53 U/L (ref 39–117)
BUN: 19 mg/dL (ref 6–23)
CO2: 29 mEq/L (ref 19–32)
Calcium: 9.1 mg/dL (ref 8.4–10.5)
Chloride: 100 mEq/L (ref 96–112)
Creatinine, Ser: 1.31 mg/dL (ref 0.40–1.50)
GFR: 58.18 mL/min — ABNORMAL LOW (ref >60.00)
Glucose, Bld: 89 mg/dL (ref 70–99)
Potassium: 4.2 mEq/L (ref 3.5–5.1)
Sodium: 133 mEq/L — ABNORMAL LOW (ref 135–145)
Total Bilirubin: 0.6 mg/dL (ref 0.2–1.2)
Total Protein: 6 g/dL (ref 6.0–8.3)

## 2018-08-08 LAB — HEMOGLOBIN A1C: Hgb A1c MFr Bld: 6.2 % (ref 4.6–6.5)

## 2018-08-08 LAB — TSH: TSH: 6.55 u[IU]/mL — ABNORMAL HIGH (ref 0.35–4.50)

## 2018-08-08 LAB — PSA: PSA: 1.41 ng/mL (ref 0.10–4.00)

## 2018-08-08 MED ORDER — ZOSTER VAC RECOMB ADJUVANTED 50 MCG/0.5ML IM SUSR: 0.5000 mL | Freq: Once | INTRAMUSCULAR | 1 refills | Status: AC

## 2018-08-08 NOTE — Patient Instructions (Signed)
GO TO THE LAB : Get the blood work     GO TO THE FRONT DESK Schedule your next appointment for a  Check up in 6 months     Check the  blood pressure 2  times a month   Be sure your blood pressure is between 110/65 and  135/85. If it is consistently higher or lower, let me know

## 2018-08-08 NOTE — Progress Notes (Signed)
Pre visit review using our clinic review tool, if applicable. No additional management support is needed unless otherwise documented below in the visit note. 

## 2018-08-08 NOTE — Progress Notes (Signed)
Subjective:    Patient ID: [name] [name], male    DOB: [birth year], 67 y.o.   MRN: [id number]  DOS:  08/08/2018 Type of visit - description : cpx Interval history: Since the last office visit, he is doing well has no major concerns.   Review of Systems Occasional pain in the shoulders and feet, at baseline. Occasionally left-sided back ache with certain movements, no persistent symptoms, no dysuria or gross hematuria.  Other than above, a 14 point review of systems is negative      Past Medical History:  Diagnosis Date  . 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 Colon polyps    Colon polyps at age 54, next Cscope age 66  . Diabetes mellitus   . DJD (degenerative joint disease)   . Glaucoma suspect   . Hyperlipidemia     Past Surgical History:  Procedure Laterality Date  . BUNIONECTOMY Right 12/27/2014   [facility]  . BUNIONECTOMY Left    2019  . NASAL SEPTUM SURGERY  2000  . SHOULDER ARTHROSCOPY     L 2011, R 2012    Social History   Socioeconomic History  . Marital status: Married    Spouse name: Not on file  . Number of children: 2  . Years of education: Not on file  . Highest education level: Not on file  Occupational History  . Occupation: disability    Employer: [employer]  . Financial resource strain: Not on file  . Food insecurity:    Worry: Not on file    Inability: Not on file  . Transportation needs:    Medical: Not on file    Non-medical: Not on file  Tobacco Use  . Smoking status: Current Some Day Smoker  . Smokeless tobacco: Never Used  . Tobacco comment:    Substance and Sexual Activity  . Alcohol use: Yes    Alcohol/week: 2.0 standard drinks    Types: 2 Cans of beer per week    Comment: socially   . Drug use: No  . Sexual activity: Yes  Lifestyle  . Physical activity:    Days per week: Not on file    Minutes per session: Not on file  . Stress: Not on file  Relationships  . Social connections:    Talks on phone: Not  on file    Gets together: Not on file    Attends religious service: Not on file    Active member of club or organization: Not on file    Attends meetings of clubs or organizations: Not on file    Relationship status: Not on file  . Intimate partner violence:    Fear of current or ex partner: Not on file    Emotionally abused: Not on file    Physically abused: Not on file    Forced sexual activity: Not on file  Other Topics Concern  . Not on file  Social History Narrative   Household-- pt and wife    Luz Lex a lot      Family History  Problem Relation Age of Onset  . Arthritis Mother   . Alzheimer's disease Mother   . Lung cancer Father   . Stroke Other   . Diabetes Other        uncle, brother (borderline)  . GER disease Sister   . Colon cancer Neg Hx   . Prostate cancer Neg Hx   . CAD Neg Hx      Allergies as  of 08/08/2018   No Known Allergies     Medication List        Accurate as of 08/08/18 12:10 PM. Always use your most recent med list.          ACCU-CHEK AVIVA PLUS w/Device Kit Use to check blood sugar   accu-chek soft touch lancets Check blood sugars no more than twice daily   aspirin 81 MG tablet Take 81 mg by mouth daily.   atorvastatin 40 MG tablet Commonly known as:  LIPITOR Take 1 tablet (40 mg total) by mouth daily.   fish oil-omega-3 fatty acids 1000 MG capsule Take 2 g by mouth daily.   glucose blood test strip Check blood sugar no more than twice daily   HYDROcodone-ibuprofen 7.5-200 MG tablet Commonly known as:  VICOPROFEN Take 1 tablet by mouth every 6 (six) hours as needed for moderate pain.   IBUPROFEN PO Take by mouth as needed. Reported on 01/27/2016   levothyroxine 75 MCG tablet Commonly known as:  SYNTHROID, LEVOTHROID Take 1 tablet (75 mcg total) by mouth daily before breakfast.   lisinopril 2.5 MG tablet Commonly known as:  PRINIVIL,ZESTRIL Take 1 tablet (2.5 mg total) by mouth daily.   metFORMIN 500 MG 24 hr  tablet Commonly known as:  GLUCOPHAGE-XR Take 2 tablets (1,000 mg total) by mouth 2 (two) times daily.   multivitamin tablet Take 1 tablet by mouth daily.   mupirocin cream 2 % Commonly known as:  BACTROBAN Apply 1 application topically as needed. Reported on 01/27/2016   sildenafil 20 MG tablet Commonly known as:  REVATIO Take 2-3 tablets (40-60 mg total) by mouth daily as needed.   sitaGLIPtin 100 MG tablet Commonly known as:  JANUVIA Take 1 tablet (100 mg total) by mouth daily.   triamcinolone cream 0.1 % Commonly known as:  KENALOG   Zoster Vaccine Adjuvanted injection Commonly known as:  SHINGRIX Inject 0.5 mLs into the muscle once for 1 dose.          Objective:   Physical Exam BP (!) 142/68 (BP Location: Left Arm, Patient Position: Sitting, Cuff Size: Normal)   Pulse (!) 48   Temp 97.8 F (36.6 C) (Oral)   Resp 16   Ht '6\' 5"'  (1.956 m)   Wt 256 lb 4 oz (116.2 kg)   SpO2 97%   BMI 30.39 kg/m  General: Well developed, NAD, see BMI.  Neck: No  thyromegaly  HEENT:  Normocephalic . Face symmetric, atraumatic Lungs:  CTA B Normal respiratory effort, no intercostal retractions, no accessory muscle use. Heart: RRR,  no murmur.  No pretibial edema bilaterally  Abdomen:  Not distended, soft, non-tender. No rebound or rigidity.   Rectal: External abnormalities: none. Normal sphincter tone. No rectal masses or tenderness.  Brown stools Prostate: Prostate gland firm and smooth, no enlargement, nodularity, tenderness, mass, asymmetry or induration Skin: Exposed areas without rash. Not pale. Not jaundice Neurologic:  alert & oriented X3.  Speech normal, gait appropriate for age and unassisted Strength symmetric and appropriate for age.  Psych: Cognition and judgment appear intact.  Cooperative with normal attention span and concentration.  Behavior appropriate. No anxious or depressed appearing.     Assessment & Plan:   Assessment DM- on  ACEi Hyperlipidemia Hypothyroidism DJD Pre-glaucoma E.D. Chronic R foot pain, s/p surgery w/ podiatry 2016  PLAN: BO:FBPZ A1c satisfactory, on metformin, Januvia, lisinopril.  Check A1c. Hyperlipidemia: Well-controlled on Lipitor Hypothyroidism: On meds, checking a TSH. DJD: Symptoms at baseline BP  is slightly elevated at 142/68, recommend ambulatory BPs.  See instructions. RTC 6 months

## 2018-08-08 NOTE — Assessment & Plan Note (Addendum)
-  Td 2013; Zostavax 11-2012; Pneumonia shot - 2015;  prevnar:2015;  Flu shot today, shingrex printed  -Prostate cancer screening--DRE wnl, check a PSA -[facility]: Had a colonoscopy at age 67. No records.  Had another colonoscopy 08-2014, + polyps, repeat in 5 years. See report.  -Lung cancer screening: See entry from a year ago, he qualifies, CT referral failed, discussed again, will refer again -smoker:planning to quit w/ a patch, praised!  -Diet and exercise discussed. -Labs: CMP, CBC, A1c, PSA, TSH - also request a hep A serology

## 2018-08-08 NOTE — Assessment & Plan Note (Signed)
  TO:IZTI A1c satisfactory, on metformin, Januvia, lisinopril.  Check A1c. Hyperlipidemia: Well-controlled on Lipitor Hypothyroidism: On meds, checking a TSH. DJD: Symptoms at baseline BP is slightly elevated at 142/68, recommend ambulatory BPs.  See instructions. RTC 6 months

## 2018-08-09 LAB — HEPATITIS A ANTIBODY, TOTAL: Hepatitis A AB,Total: NONREACTIVE

## 2018-08-09 MED ORDER — LEVOTHYROXINE SODIUM 100 MCG PO TABS: 100.0000 ug | ORAL_TABLET | Freq: Every day | ORAL | 2 refills | Status: DC

## 2018-08-09 NOTE — Addendum Note (Signed)
Addended byDamita Dunnings D on: 08/09/2018 01:21 PM   Modules accepted: Orders

## 2018-08-30 ENCOUNTER — Ambulatory Visit (HOSPITAL_BASED_OUTPATIENT_CLINIC_OR_DEPARTMENT_OTHER)
Admission: RE | Admit: 2018-08-30 | Discharge: 2018-08-30 | Disposition: A | Discharge status: Home / Self Care | Payer: Medicare Other | Source: Ambulatory Visit | Attending: Internal Medicine | Admitting: Internal Medicine

## 2018-08-30 DIAGNOSIS — F17210 Nicotine dependence, cigarettes, uncomplicated: Secondary | ICD-10-CM | POA: Insufficient documentation

## 2018-08-30 DIAGNOSIS — Z122 Encounter for screening for malignant neoplasm of respiratory organs: Secondary | ICD-10-CM | POA: Insufficient documentation

## 2018-08-30 DIAGNOSIS — Z87891 Personal history of nicotine dependence: Secondary | ICD-10-CM | POA: Diagnosis not present

## 2018-08-30 IMAGING — CT CT CHEST LUNG CANCER SCREENING LOW DOSE W/O CM
2 of 4 series · 15 of 40 positions shown, 18 images · non-contrast
Comparison: None.

CLINICAL DATA: 67-year-old male with 47 pack-year history of
smoking. Lung cancer screening.

EXAM:
CT CHEST WITHOUT CONTRAST LOW-DOSE FOR LUNG CANCER SCREENING
TECHNIQUE: Multidetector CT imaging of the chest was performed following the
standard protocol without IV contrast.

[Series 2: axial st · axial · 0.84mm/px · z∈[-361,-41]mm · 12 of 70 slices shown, 15 images]
[im 3/70  mediastinal]
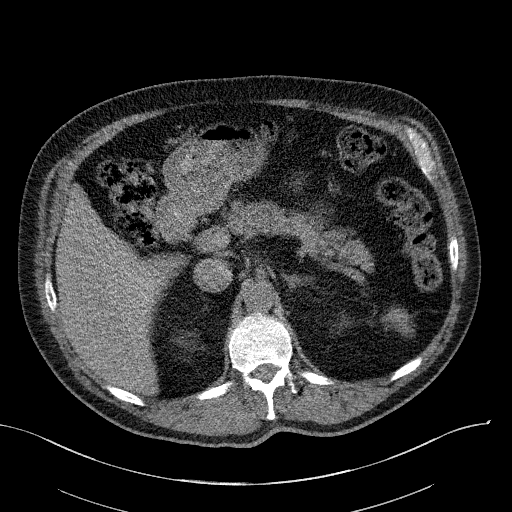
[im 3/70  lung]
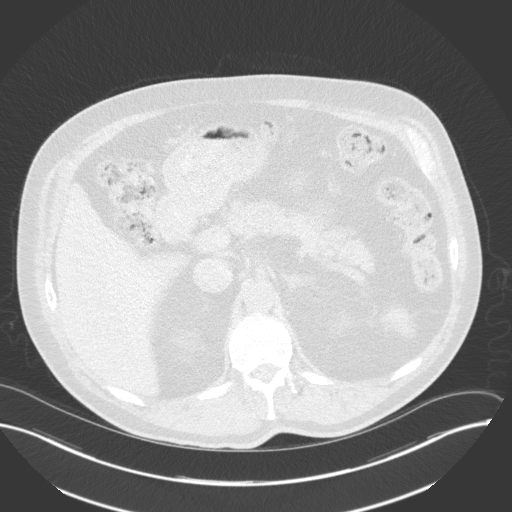
[im 9/70  lung]
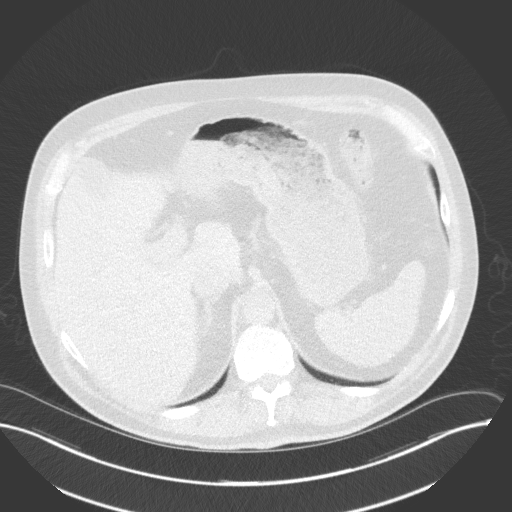
[im 15/70  lung]
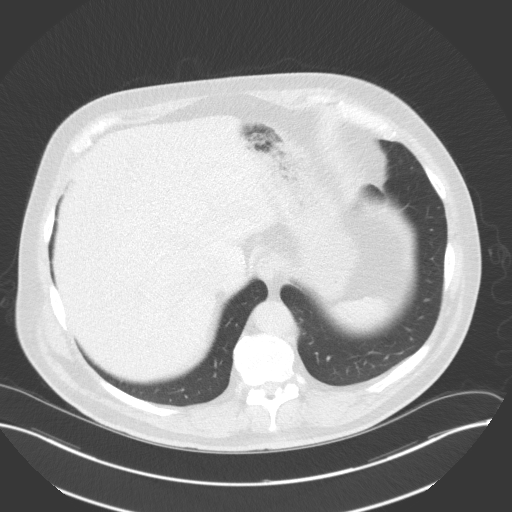
[im 21/70  lung]
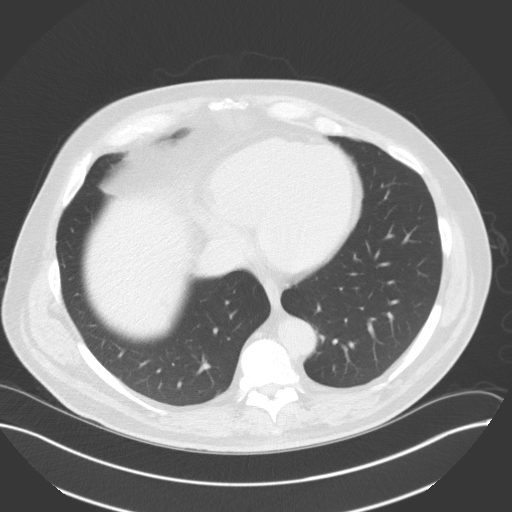
[im 26/70  mediastinal]
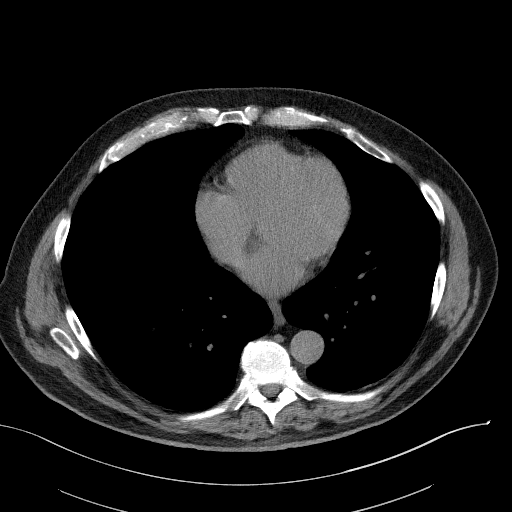
[im 26/70  lung]
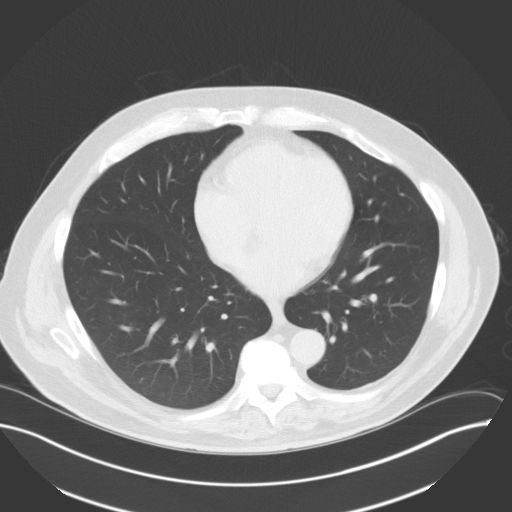
[im 32/70  lung]
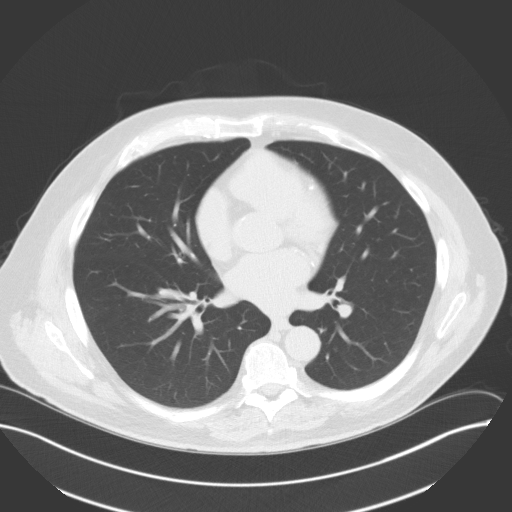
[im 38/70  lung]
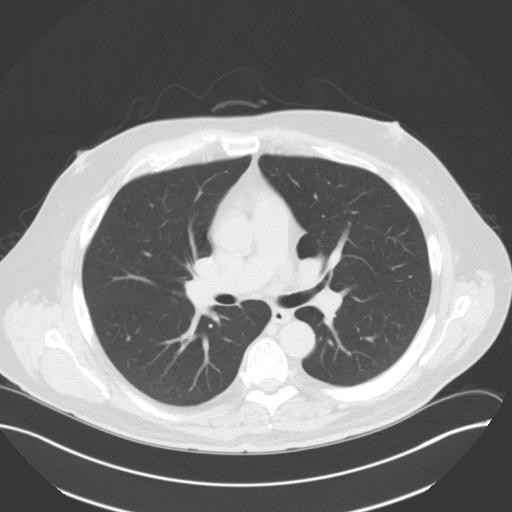
[im 44/70  lung]
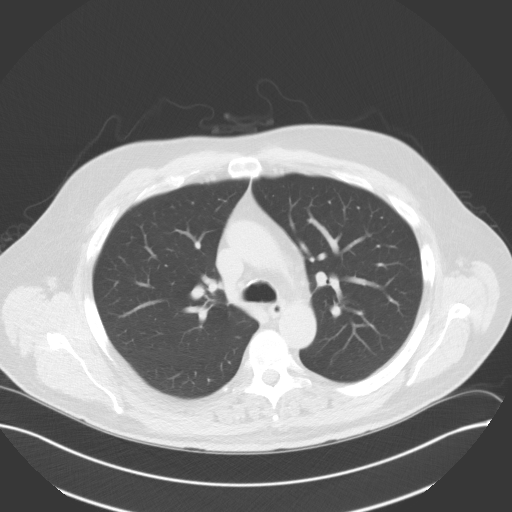
[im 49/70  mediastinal]
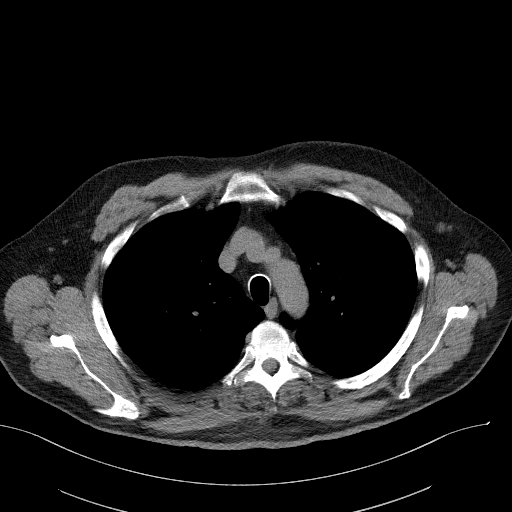
[im 49/70  lung]
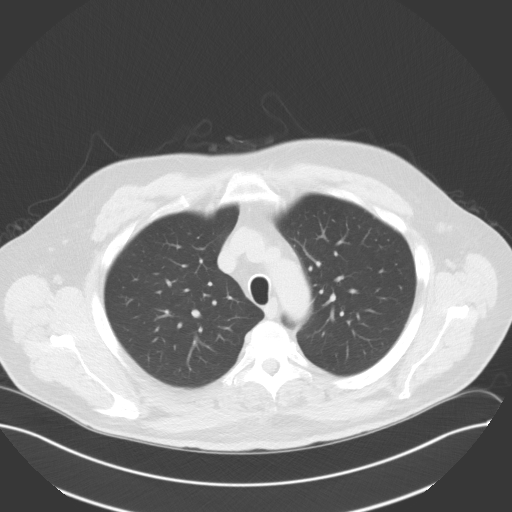
[im 55/70  lung]
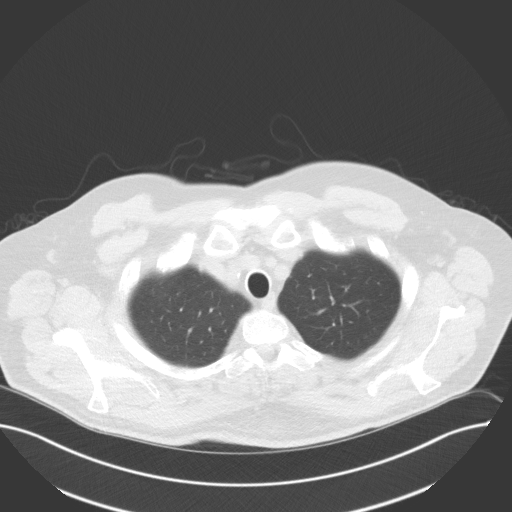
[im 61/70  lung]
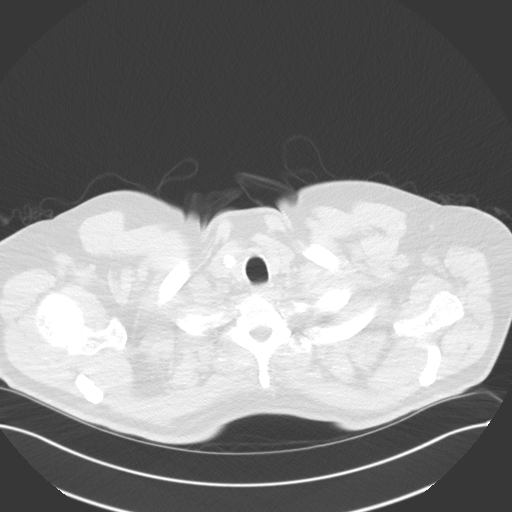
[im 67/70  lung]
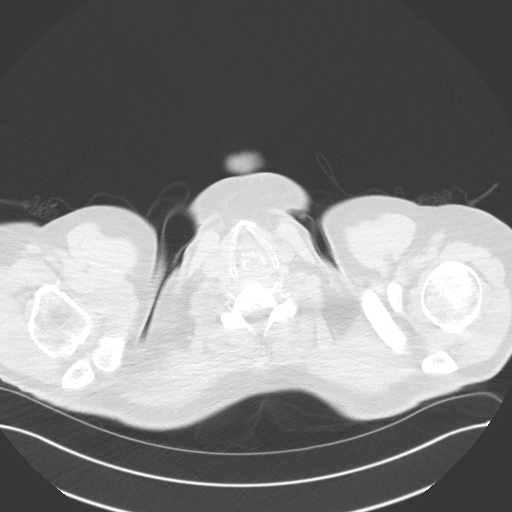

[Series 5: coronal · coronal · 0.70mm/px · 3 of 319 slices shown]
[im 64/319  lung]
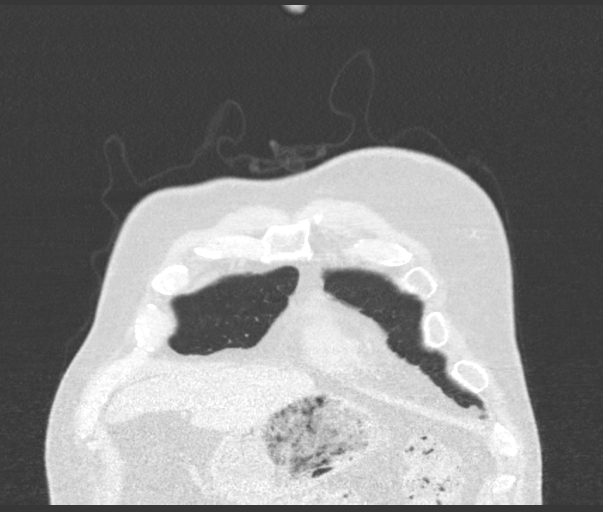
[im 128/319  lung]
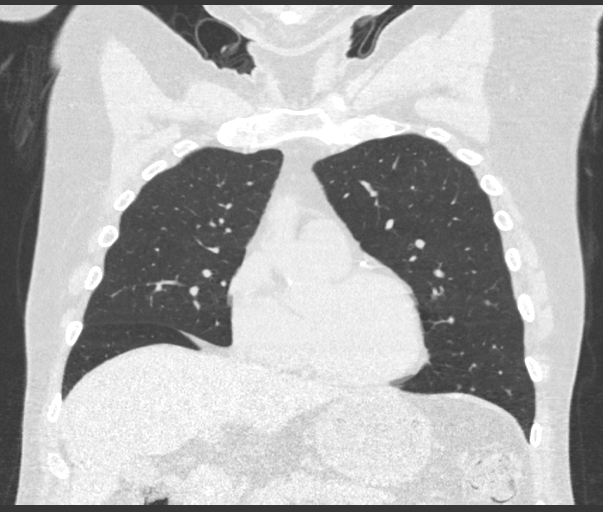
[im 191/319  lung]
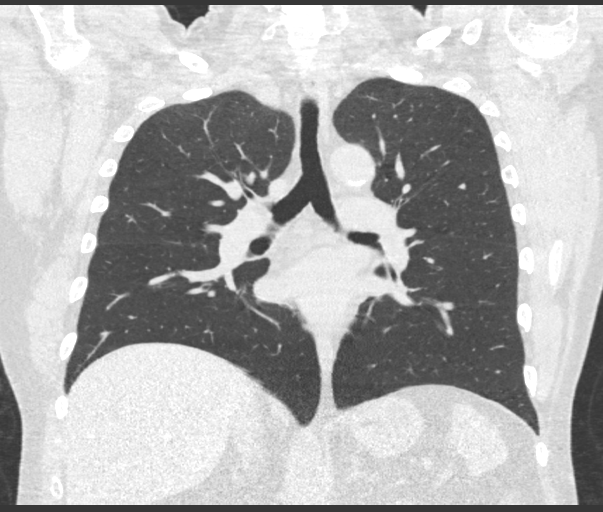

[15 of 40 positions shown; findings below may reference images not displayed]

FINDINGS: Cardiovascular: The heart size is normal. No substantial pericardial
effusion. Coronary artery calcification is evident. Atherosclerotic
calcification is noted in the wall of the thoracic aorta.

Mediastinum/Nodes: No mediastinal lymphadenopathy. No evidence for
gross hilar lymphadenopathy although assessment is limited by the
lack of intravenous contrast on today's study. The esophagus has
normal imaging features. There is no axillary lymphadenopathy.

Lungs/Pleura: The central tracheobronchial airways are patent.
Scattered bilateral tiny pulmonary nodules are evident. No overtly
suspicious pulmonary nodule or mass. No edema or pleural effusion.

Upper Abdomen: Unremarkable.

Musculoskeletal: Insert bone windows
IMPRESSION: 1. Lung-RADS 2, benign appearance or behavior. Continue annual
screening with low-dose chest CT without contrast in 12 months.
2.  Aortic Atherosclerois ([1T]-170.0)

## 2018-09-19 ENCOUNTER — Other Ambulatory Visit: Payer: Self-pay | Admitting: Internal Medicine

## 2018-09-19 MED ORDER — LEVOTHYROXINE SODIUM 100 MCG PO TABS: 100.0000 ug | ORAL_TABLET | Freq: Every day | ORAL | 1 refills | Status: DC

## 2018-09-21 ENCOUNTER — Other Ambulatory Visit (INDEPENDENT_AMBULATORY_CARE_PROVIDER_SITE_OTHER): Payer: Medicare Other

## 2018-09-21 DIAGNOSIS — E039 Hypothyroidism, unspecified: Secondary | ICD-10-CM

## 2018-09-21 LAB — TSH: TSH: 0.01 u[IU]/mL — ABNORMAL LOW (ref 0.35–4.50)

## 2018-09-26 ENCOUNTER — Encounter: Payer: Self-pay | Admitting: Internal Medicine

## 2018-09-26 MED ORDER — LEVOTHYROXINE SODIUM 88 MCG PO TABS: 88.0000 ug | ORAL_TABLET | Freq: Every day | ORAL | 3 refills | Status: DC

## 2018-09-26 NOTE — Addendum Note (Signed)
Addended byDamita Dunnings D on: 09/26/2018 10:12 AM   Modules accepted: Orders

## 2018-11-28 ENCOUNTER — Other Ambulatory Visit (INDEPENDENT_AMBULATORY_CARE_PROVIDER_SITE_OTHER): Payer: Medicare Other

## 2018-11-28 DIAGNOSIS — E039 Hypothyroidism, unspecified: Secondary | ICD-10-CM | POA: Diagnosis not present

## 2018-11-28 LAB — TSH: TSH: 0.2 u[IU]/mL — ABNORMAL LOW (ref 0.35–4.50)

## 2018-11-30 ENCOUNTER — Encounter: Payer: Self-pay | Admitting: Internal Medicine

## 2018-12-01 MED ORDER — LEVOTHYROXINE SODIUM 75 MCG PO TABS: 75.0000 ug | ORAL_TABLET | Freq: Every day | ORAL | 1 refills | Status: DC

## 2018-12-01 NOTE — Addendum Note (Signed)
Addended byDamita Dunnings D on: 12/01/2018 07:48 AM   Modules accepted: Orders

## 2018-12-20 DIAGNOSIS — E119 Type 2 diabetes mellitus without complications: Secondary | ICD-10-CM | POA: Diagnosis not present

## 2019-01-06 DIAGNOSIS — L821 Other seborrheic keratosis: Secondary | ICD-10-CM | POA: Diagnosis not present

## 2019-01-06 DIAGNOSIS — D225 Melanocytic nevi of trunk: Secondary | ICD-10-CM | POA: Diagnosis not present

## 2019-01-06 DIAGNOSIS — L720 Epidermal cyst: Secondary | ICD-10-CM | POA: Diagnosis not present

## 2019-01-06 DIAGNOSIS — L812 Freckles: Secondary | ICD-10-CM | POA: Diagnosis not present

## 2019-01-06 DIAGNOSIS — D485 Neoplasm of uncertain behavior of skin: Secondary | ICD-10-CM | POA: Diagnosis not present

## 2019-01-13 ENCOUNTER — Other Ambulatory Visit (INDEPENDENT_AMBULATORY_CARE_PROVIDER_SITE_OTHER): Payer: Medicare Other

## 2019-01-13 ENCOUNTER — Other Ambulatory Visit: Payer: Self-pay

## 2019-01-13 DIAGNOSIS — E039 Hypothyroidism, unspecified: Secondary | ICD-10-CM

## 2019-01-13 LAB — TSH: TSH: 4.45 u[IU]/mL (ref 0.35–4.50)

## 2019-01-13 MED ORDER — LEVOTHYROXINE SODIUM 75 MCG PO TABS: 75.0000 ug | ORAL_TABLET | Freq: Every day | ORAL | 5 refills | Status: DC

## 2019-01-13 NOTE — Addendum Note (Signed)
Addended byDamita Dunnings D on: 01/13/2019 03:40 PM   Modules accepted: Orders

## 2019-02-02 ENCOUNTER — Encounter: Payer: Self-pay | Admitting: *Deleted

## 2019-02-02 ENCOUNTER — Ambulatory Visit (INDEPENDENT_AMBULATORY_CARE_PROVIDER_SITE_OTHER): Payer: Medicare Other | Admitting: *Deleted

## 2019-02-02 ENCOUNTER — Other Ambulatory Visit: Payer: Self-pay

## 2019-02-02 DIAGNOSIS — Z0000 Encounter for general adult medical examination without abnormal findings: Secondary | ICD-10-CM

## 2019-02-02 NOTE — Patient Instructions (Signed)
See you next year!  Continue to eat heart healthy diet (full of fruits, vegetables, whole grains, lean protein, water--limit salt, fat, and sugar intake) and increase physical activity as tolerated.  Continue doing brain stimulating activities (puzzles, reading, adult coloring books, staying active) to keep memory sharp.    [name] [name] , Thank you for taking time to come for your Medicare Wellness Visit. I appreciate your ongoing commitment to your health goals. Please review the following plan we discussed and let me know if I can assist you in the future.   These are the goals we discussed: Goals    . Increase physical activity    . [place] (pt-stated)    . Quit Smoking       This is a list of the screening recommended for you and due dates:  Health Maintenance  Topic Date Due  . Pneumonia vaccines (2 of 2 - PPSV23) 10/30/2018  . Eye exam for diabetics  12/19/2018  . Complete foot exam   01/29/2019  . Hemoglobin A1C  02/07/2019  . Flu Shot  05/27/2019  . Colon Cancer Screening  09/06/2019  . Tetanus Vaccine  08/04/2022  .  Hepatitis C: One time screening is recommended by Center for Disease Control  (CDC) for  adults born from [birth year] through 1964.   Completed    Health Maintenance After Age 46 After age 69, you are at a higher risk for certain long-term diseases and infections as well as injuries from falls. Falls are a major cause of broken bones and head injuries in people who are older than age 109. Getting regular preventive care can help to keep you healthy and well. Preventive care includes getting regular testing and making lifestyle changes as recommended by your health care provider. Talk with your health care provider about:  Which screenings and tests you should have. A screening is a test that checks for a disease when you have no symptoms.  A diet and exercise plan that is right for you. What should I know about screenings and tests to prevent falls?  Screening and testing are the best ways to find a health problem early. Early diagnosis and treatment give you the best chance of managing medical conditions that are common after age 60. Certain conditions and lifestyle choices may make you more likely to have a fall. Your health care provider may recommend:  Regular vision checks. Poor vision and conditions such as cataracts can make you more likely to have a fall. If you wear glasses, make sure to get your prescription updated if your vision changes.  Medicine review. Work with your health care provider to regularly review all of the medicines you are taking, including over-the-counter medicines. Ask your health care provider about any side effects that may make you more likely to have a fall. Tell your health care provider if any medicines that you take make you feel dizzy or sleepy.  Osteoporosis screening. Osteoporosis is a condition that causes the bones to get weaker. This can make the bones weak and cause them to break more easily.  Blood pressure screening. Blood pressure changes and medicines to control blood pressure can make you feel dizzy.  Strength and balance checks. Your health care provider may recommend certain tests to check your strength and balance while standing, walking, or changing positions.  Foot health exam. Foot pain and numbness, as well as not wearing proper footwear, can make you more likely to have a fall.  Depression  screening. You may be more likely to have a fall if you have a fear of falling, feel emotionally low, or feel unable to do activities that you used to do.  Alcohol use screening. Using too much alcohol can affect your balance and may make you more likely to have a fall. What actions can I take to lower my risk of falls? General instructions  Talk with your health care provider about your risks for falling. Tell your health care provider if: ? You fall. Be sure to tell your health care provider  about all falls, even ones that seem minor. ? You feel dizzy, sleepy, or off-balance.  Take over-the-counter and prescription medicines only as told by your health care provider. These include any supplements.  Eat a healthy diet and maintain a healthy weight. A healthy diet includes low-fat dairy products, low-fat (lean) meats, and fiber from whole grains, beans, and lots of fruits and vegetables. Home safety  Remove any tripping hazards, such as rugs, cords, and clutter.  Install safety equipment such as grab bars in bathrooms and safety rails on stairs.  Keep rooms and walkways well-lit. Activity   Follow a regular exercise program to stay fit. This will help you maintain your balance. Ask your health care provider what types of exercise are appropriate for you.  If you need a cane or walker, use it as recommended by your health care provider.  Wear supportive shoes that have nonskid soles. Lifestyle  Do not drink alcohol if your health care provider tells you not to drink.  If you drink alcohol, limit how much you have: ? 0-1 drink a day for women. ? 0-2 drinks a day for men.  Be aware of how much alcohol is in your drink. In the [place] one drink equals one typical bottle of beer (12 oz), one-half glass of wine (5 oz), or one shot of hard liquor (1 oz).  Do not use any products that contain nicotine or tobacco, such as cigarettes and e-cigarettes. If you need help quitting, ask your health care provider. Summary  Having a healthy lifestyle and getting preventive care can help to protect your health and wellness after age 64.  Screening and testing are the best way to find a health problem early and help you avoid having a fall. Early diagnosis and treatment give you the best chance for managing medical conditions that are more common for people who are older than age 29.  Falls are a major cause of broken bones and head injuries in people who are older than age 61. Take  precautions to prevent a fall at home.  Work with your health care provider to learn what changes you can make to improve your health and wellness and to prevent falls. This information is not intended to replace advice given to you by your health care provider. Make sure you discuss any questions you have with your health care provider. Document Released: 08/25/2017 Document Revised: 08/25/2017 Document Reviewed: 08/25/2017 Elsevier Interactive Patient Education  2019 [employer].

## 2019-02-02 NOTE — Progress Notes (Signed)
I connected with patient 02/02/19 at  3:00 PM EDT by a video enabled telemedicine application and verified that I am speaking with the correct person using two identifiers.   Subjective:   [name] [name] is a 68 y.o. male who presents for Medicare Annual/Subsequent preventive examination.  Review of Systems: No ROS.  Medicare Wellness Visit. Additional risk factors are reflected in the social history. Cardiac Risk Factors include: advanced age (>83mn, >>67women);dyslipidemia;male gender;diabetes mellitus Sleep patterns: Feels rested. Home Safety/Smoke Alarms: Feels safe in home. Smoke alarms in place.  Lives with wife in 2 story home.  Eye-  Last in Feb 2020. Yearly with PAlois Cliche  Male:   CCS-  Due Nov. 2020   PSA-  Lab Results  Component Value Date   PSA 1.41 08/08/2018   PSA 1.12 07/28/2016   PSA 1.05 10/31/2014       Objective:    Vitals:   Unable to assess. This visit is enabled though telemedicine due to Covid 19.   Advanced Directives 02/02/2019 01/31/2018 01/28/2017  Does Patient Have a Medical Advance Directive? No No No  Would patient like information on creating a medical advance directive? No - Patient declined No - Patient declined Yes (MAU/Ambulatory/Procedural Areas - Information given)    Tobacco Social History   Tobacco Use  Smoking Status Current Some Day Smoker  Smokeless Tobacco Never Used  Tobacco Comment         Ready to quit: Not Answered Counseling given: Not Answered Comment:     Clinical Intake: Pain : No/denies pain    Past Medical History:  Diagnosis Date  . 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 Colon polyps    Colon polyps at age [age] next Cscope age 68 . Diabetes mellitus   . DJD (degenerative joint disease)   . Glaucoma suspect   . Hyperlipidemia    Past Surgical History:  Procedure Laterality Date  . BUNIONECTOMY Right 12/27/2014   [facility]  . BUNIONECTOMY Left    2019  . NASAL SEPTUM SURGERY  2000  . SHOULDER  ARTHROSCOPY     L 2011, R 2012   Family History  Problem Relation Age of Onset  . Arthritis Mother   . Alzheimer's disease Mother   . Lung cancer Father   . Stroke Other   . Diabetes Other        uncle, brother (borderline)  . GER disease Sister   . Colon cancer Neg Hx   . Prostate cancer Neg Hx   . CAD Neg Hx    Social History   Socioeconomic History  . Marital status: Married    Spouse name: Not on file  . Number of children: 2  . Years of education: Not on file  . Highest education level: Not on file  Occupational History  . Occupation: disability    Employer: [employer] . Financial resource strain: Not on file  . Food insecurity:    Worry: Not on file    Inability: Not on file  . Transportation needs:    Medical: Not on file    Non-medical: Not on file  Tobacco Use  . Smoking status: Current Some Day Smoker  . Smokeless tobacco: Never Used  . Tobacco comment:    Substance and Sexual Activity  . Alcohol use: Yes    Alcohol/week: 2.0 standard drinks    Types: 2 Cans of beer per week    Comment: socially   . Drug  use: No  . Sexual activity: Yes  Lifestyle  . Physical activity:    Days per week: Not on file    Minutes per session: Not on file  . Stress: Not on file  Relationships  . Social connections:    Talks on phone: Not on file    Gets together: Not on file    Attends religious service: Not on file    Active member of club or organization: Not on file    Attends meetings of clubs or organizations: Not on file    Relationship status: Not on file  Other Topics Concern  . Not on file  Social History Narrative   Household-- pt and wife    Luz Lex a lot     Outpatient Encounter Medications as of 02/02/2019  Medication Sig  . aspirin 81 MG tablet Take 81 mg by mouth daily.  Marland Kitchen atorvastatin (LIPITOR) 40 MG tablet Take 1 tablet (40 mg total) by mouth daily.  . Blood Glucose Monitoring Suppl (ACCU-CHEK AVIVA PLUS) w/Device KIT Use to check  blood sugar  . fish oil-omega-3 fatty acids 1000 MG capsule Take 2 g by mouth daily.  Marland Kitchen glucose blood (ACCU-CHEK AVIVA) test strip Check blood sugar no more than twice daily  . IBUPROFEN PO Take by mouth as needed. Reported on 01/27/2016  . Lancets (ACCU-CHEK SOFT TOUCH) lancets Check blood sugars no more than twice daily  . levothyroxine (SYNTHROID, LEVOTHROID) 75 MCG tablet Take 1 tablet (75 mcg total) by mouth daily before breakfast.  . lisinopril (PRINIVIL,ZESTRIL) 2.5 MG tablet Take 1 tablet (2.5 mg total) by mouth daily.  . metFORMIN (GLUCOPHAGE-XR) 500 MG 24 hr tablet Take 2 tablets (1,000 mg total) by mouth 2 (two) times daily.  . Multiple Vitamin (MULTIVITAMIN) tablet Take 1 tablet by mouth daily.  . mupirocin cream (BACTROBAN) 2 % Apply 1 application topically as needed. Reported on 01/27/2016  . sildenafil (REVATIO) 20 MG tablet Take 2-3 tablets (40-60 mg total) by mouth daily as needed.  . sitaGLIPtin (JANUVIA) 100 MG tablet Take 1 tablet (100 mg total) by mouth daily.  Marland Kitchen triamcinolone cream (KENALOG) 0.1 %   . HYDROcodone-ibuprofen (VICOPROFEN) 7.5-200 MG tablet Take 1 tablet by mouth every 6 (six) hours as needed for moderate pain. (Patient not taking: Reported on 02/02/2019)   No facility-administered encounter medications on file as of 02/02/2019.     Activities of Daily Living In your present state of health, do you have any difficulty performing the following activities: 02/02/2019  Hearing? N  Vision? N  Difficulty concentrating or making decisions? N  Walking or climbing stairs? N  Dressing or bathing? N  Doing errands, shopping? N  Preparing Food and eating ? N  Using the Toilet? N  In the past six months, have you accidently leaked urine? N  Do you have problems with loss of bowel control? N  Managing your Medications? N  Managing your Finances? N  Housekeeping or managing your Housekeeping? N  Some recent data might be hidden    Patient Care Team: Colon Branch, MD as  PCP - General (Internal Medicine) Idolina Primer Warnell Bureau (Optometry) Jarome Matin, MD as Consulting Physician (Dermatology)   Assessment:   This is a routine wellness examination for [name] [name]. Physical assessment deferred to PCP.  Exercise Activities and Dietary recommendations Current Exercise Habits: Home exercise routine, Type of exercise: walking, Time (Minutes): 15, Intensity: Mild, Exercise limited by: None identified   Diet (meal preparation, eat out, water intake, caffeinated  beverages, dairy products, fruits and vegetables): well balanced    Goals    . Increase physical activity    . [place] (pt-stated)    . Quit Smoking       Fall Risk Fall Risk  02/02/2019 01/31/2018 01/28/2017 01/27/2016 08/07/2015  Falls in the past year? 0 No No No No     Depression Screen PHQ 2/9 Scores 02/02/2019 01/31/2018 01/28/2017 01/27/2016  PHQ - 2 Score 0 0 0 0    Cognitive Function Ad8 score reviewed for issues:  Issues making decisions:no  Less interest in hobbies / activities:no  Repeats questions, stories (family complaining):no  Trouble using ordinary gadgets (microwave, computer, phone):no  Forgets the month or year: no  Mismanaging finances: no  Remembering appts:no  Daily problems with thinking and/or memory:no Ad8 score is=0   MMSE - Mini Mental State Exam 01/31/2018 01/28/2017  Orientation to time 5 5  Orientation to Place 5 5  Registration 3 3  Attention/ Calculation 5 5  Recall 3 2  Language- name 2 objects 2 2  Language- repeat 1 1  Language- follow 3 step command 3 3  Language- read & follow direction 1 1  Write a sentence 1 1  Copy design 1 1  Total score 30 29        Immunization History  Administered Date(s) Administered  . Influenza Split 08/04/2012, 07/27/2014  . Influenza, High Dose Seasonal PF 08/04/2017, 08/08/2018  . Influenza,inj,Quad PF,6+ Mos 08/15/2013, 08/07/2015, 07/28/2016  . Pneumococcal Conjugate-13 04/30/2014  . Pneumococcal  Polysaccharide-23 10/30/2013  . Tdap 08/04/2012  . Zoster 12/05/2012    Screening Tests Health Maintenance  Topic Date Due  . PNA vac Low Risk Adult (2 of 2 - PPSV23) 10/30/2018  . OPHTHALMOLOGY EXAM  12/19/2018  . FOOT EXAM  01/29/2019  . HEMOGLOBIN A1C  02/07/2019  . INFLUENZA VACCINE  05/27/2019  . COLONOSCOPY  09/06/2019  . TETANUS/TDAP  08/04/2022  . Hepatitis C Screening  Completed   Plan:   See you next year!  Continue to eat heart healthy diet (full of fruits, vegetables, whole grains, lean protein, water--limit salt, fat, and sugar intake) and increase physical activity as tolerated.  Continue doing brain stimulating activities (puzzles, reading, adult coloring books, staying active) to keep memory sharp.      I have personally reviewed and noted the following in the patient's chart:   . Medical and social history . Use of alcohol, tobacco or illicit drugs  . Current medications and supplements . Functional ability and status . Nutritional status . Physical activity . Advanced directives . List of other physicians . Hospitalizations, surgeries, and ER visits in previous 12 months . Vitals . Screenings to include cognitive, depression, and falls . Referrals and appointments  In addition, I have reviewed and discussed with patient certain preventive protocols, quality metrics, and best practice recommendations. A written personalized care plan for preventive services as well as general preventive health recommendations were provided to patient.     Shela Nevin, [place]  02/02/2019

## 2019-02-02 NOTE — Progress Notes (Signed)
Noted. Agree with above.  [place], DO 02/02/19 3:47 PM

## 2019-02-07 ENCOUNTER — Ambulatory Visit (INDEPENDENT_AMBULATORY_CARE_PROVIDER_SITE_OTHER): Payer: Medicare Other | Admitting: Internal Medicine

## 2019-02-07 ENCOUNTER — Other Ambulatory Visit: Payer: Self-pay

## 2019-02-07 ENCOUNTER — Encounter: Payer: Self-pay | Admitting: Internal Medicine

## 2019-02-07 DIAGNOSIS — E039 Hypothyroidism, unspecified: Secondary | ICD-10-CM | POA: Diagnosis not present

## 2019-02-07 DIAGNOSIS — E785 Hyperlipidemia, unspecified: Secondary | ICD-10-CM | POA: Diagnosis not present

## 2019-02-07 DIAGNOSIS — E119 Type 2 diabetes mellitus without complications: Secondary | ICD-10-CM | POA: Diagnosis not present

## 2019-02-07 NOTE — Progress Notes (Signed)
Subjective:    Patient ID: [name] [name], male    DOB: Oct 17, 1951, 68 y.o.   MRN: [id number]  DOS:  02/07/2019 Type of visit - description: Virtual Visit via Video Note  I connected with@ on 02/08/19 at  9:40 AM EDT by a video enabled telemedicine application and verified that I am speaking with the correct person using two identifiers.   THIS ENCOUNTER IS A VIRTUAL VISIT DUE TO COVID-19 - PATIENT WAS NOT SEEN IN THE OFFICE. PATIENT HAS CONSENTED TO VIRTUAL VISIT / TELEMEDICINE VISIT   Location of patient: home  Location of provider: office  I discussed the limitations of evaluation and management by telemedicine and the availability of in person appointments. The patient expressed understanding and agreed to proceed.  History of Present Illness: Routine office visit DM: CBGs usually 100, 108.  Last week they were slightly low but they are back to normal this week. Ambulatory BPs: Typically within normal High cholesterol: Due for labs, good med compliance Hypothyroidism: Good med compliance. Coronavirus: Good compliance with CDC recommendations.   Review of Systems Denies fever, chills. No cough, chest pain, difficulty breathing. No nausea, vomiting, diarrhea  Past Medical History:  Diagnosis Date  . 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 Colon polyps    Colon polyps at age 89, next Cscope age 43  . Diabetes mellitus   . DJD (degenerative joint disease)   . Glaucoma suspect   . Hyperlipidemia     Past Surgical History:  Procedure Laterality Date  . BUNIONECTOMY Right 12/27/2014   [facility]  . BUNIONECTOMY Left    2019  . NASAL SEPTUM SURGERY  2000  . SHOULDER ARTHROSCOPY     L 2011, R 2012    Social History   Socioeconomic History  . Marital status: Married    Spouse name: Not on file  . Number of children: 2  . Years of education: Not on file  . Highest education level: Not on file  Occupational History  . Occupation: disability    Employer: [employer]  . Financial resource strain: Not on file  . Food insecurity:    Worry: Not on file    Inability: Not on file  . Transportation needs:    Medical: Not on file    Non-medical: Not on file  Tobacco Use  . Smoking status: Current Some Day Smoker  . Smokeless tobacco: Never Used  . Tobacco comment:    Substance and Sexual Activity  . Alcohol use: Yes    Alcohol/week: 2.0 standard drinks    Types: 2 Cans of beer per week    Comment: socially   . Drug use: No  . Sexual activity: Yes  Lifestyle  . Physical activity:    Days per week: Not on file    Minutes per session: Not on file  . Stress: Not on file  Relationships  . Social connections:    Talks on phone: Not on file    Gets together: Not on file    Attends religious service: Not on file    Active member of club or organization: Not on file    Attends meetings of clubs or organizations: Not on file    Relationship status: Not on file  . Intimate partner violence:    Fear of current or ex partner: Not on file    Emotionally abused: Not on file    Physically abused: Not on file    Forced sexual activity: Not on  file  Other Topics Concern  . Not on file  Social History Narrative   Household-- pt and wife    Luz Lex a lot       Allergies as of 02/07/2019   No Known Allergies     Medication List       Accurate as of February 07, 2019 11:59 PM. Always use your most recent med list.        Accu-Chek Aviva Plus w/Device Kit Use to check blood sugar   accu-chek soft touch lancets Check blood sugars no more than twice daily   aspirin 81 MG tablet Take 81 mg by mouth daily.   atorvastatin 40 MG tablet Commonly known as:  LIPITOR Take 1 tablet (40 mg total) by mouth daily.   fish oil-omega-3 fatty acids 1000 MG capsule Take 2 g by mouth daily.   glucose blood test strip Commonly known as:  Accu-Chek Aviva Check blood sugar no more than twice daily   HYDROcodone-ibuprofen 7.5-200 MG tablet  Commonly known as:  Vicoprofen Take 1 tablet by mouth every 6 (six) hours as needed for moderate pain.   IBUPROFEN PO Take by mouth as needed. Reported on 01/27/2016   levothyroxine 75 MCG tablet Commonly known as:  SYNTHROID, LEVOTHROID Take 1 tablet (75 mcg total) by mouth daily before breakfast.   lisinopril 2.5 MG tablet Commonly known as:  PRINIVIL,ZESTRIL Take 1 tablet (2.5 mg total) by mouth daily.   metFORMIN 500 MG 24 hr tablet Commonly known as:  GLUCOPHAGE-XR Take 2 tablets (1,000 mg total) by mouth 2 (two) times daily.   multivitamin tablet Take 1 tablet by mouth daily.   mupirocin cream 2 % Commonly known as:  BACTROBAN Apply 1 application topically as needed. Reported on 01/27/2016   sildenafil 20 MG tablet Commonly known as:  REVATIO Take 2-3 tablets (40-60 mg total) by mouth daily as needed.   sitaGLIPtin 100 MG tablet Commonly known as:  Januvia Take 1 tablet (100 mg total) by mouth daily.   triamcinolone cream 0.1 % Commonly known as:  KENALOG           Objective:   Physical Exam There were no vitals taken for this visit. This is a video visit, patient is alert oriented x3, looks well.    Assessment     Assessment DM- on ACEi Hyperlipidemia Hypothyroidism DJD Pre-glaucoma E.D. Chronic R foot pain, s/p surgery w/ podiatry 2016  PLAN: DM: Continue metformin, Januvia and lisinopril.  CBGs last week were slightly lower than usual, in the 80s and 90s, he felt slightly tired.  CBGs this week are better in the 100, 108.  Check a A1c, CMP. Cont monitoring CBGs High cholesterol: Continue Lipitor, check a FLP. Hypothyroidism: Last TSH was in the higher side of normal, continue Synthroid, check a TSH Coronavirus education: Encouraged to continue with good hygiene practices. Plan:  CMP, FLP, A1c, TSH tomorrow at 9 AM Next visit for CPX in 6 months, patient will make an appointment

## 2019-02-08 ENCOUNTER — Other Ambulatory Visit (INDEPENDENT_AMBULATORY_CARE_PROVIDER_SITE_OTHER): Payer: Medicare Other

## 2019-02-08 ENCOUNTER — Other Ambulatory Visit: Payer: Self-pay

## 2019-02-08 DIAGNOSIS — E119 Type 2 diabetes mellitus without complications: Secondary | ICD-10-CM

## 2019-02-08 DIAGNOSIS — E785 Hyperlipidemia, unspecified: Secondary | ICD-10-CM

## 2019-02-08 DIAGNOSIS — E039 Hypothyroidism, unspecified: Secondary | ICD-10-CM | POA: Diagnosis not present

## 2019-02-08 LAB — COMPREHENSIVE METABOLIC PANEL
ALT: 12 U/L (ref 0–53)
AST: 12 U/L (ref 0–37)
Albumin: 4 g/dL (ref 3.5–5.2)
Alkaline Phosphatase: 50 U/L (ref 39–117)
BUN: 20 mg/dL (ref 6–23)
CO2: 29 mEq/L (ref 19–32)
Calcium: 9.2 mg/dL (ref 8.4–10.5)
Chloride: 101 mEq/L (ref 96–112)
Creatinine, Ser: 1.35 mg/dL (ref 0.40–1.50)
GFR: 52.79 mL/min — ABNORMAL LOW (ref >60.00)
Glucose, Bld: 98 mg/dL (ref 70–99)
Potassium: 4.8 mEq/L (ref 3.5–5.1)
Sodium: 137 mEq/L (ref 135–145)
Total Bilirubin: 0.7 mg/dL (ref 0.2–1.2)
Total Protein: 6.1 g/dL (ref 6.0–8.3)

## 2019-02-08 LAB — LIPID PANEL
Cholesterol: 128 mg/dL (ref 0–200)
HDL: 48.6 mg/dL (ref >39.00)
LDL Cholesterol: 54 mg/dL (ref 0–99)
NonHDL: 79.87
Total CHOL/HDL Ratio: 3
Triglycerides: 131 mg/dL (ref 0.0–149.0)
VLDL: 26.2 mg/dL (ref 0.0–40.0)

## 2019-02-08 LAB — TSH: TSH: 5.69 u[IU]/mL — ABNORMAL HIGH (ref 0.35–4.50)

## 2019-02-08 LAB — HEMOGLOBIN A1C: Hgb A1c MFr Bld: 6.3 % (ref 4.6–6.5)

## 2019-02-08 NOTE — Assessment & Plan Note (Signed)
DM: Continue metformin, Januvia and lisinopril.  CBGs last week were slightly lower than usual, in the 80s and 90s, he felt slightly tired.  CBGs this week are better in the 100, 108.  Check a A1c, CMP. Cont monitoring CBGs High cholesterol: Continue Lipitor, check a FLP. Hypothyroidism: Last TSH was in the higher side of normal, continue Synthroid, check a TSH Coronavirus education: Encouraged to continue with good hygiene practices. Plan:  CMP, FLP, A1c, TSH tomorrow at 9 AM Next visit for CPX in 6 months, patient will make an appointment

## 2019-02-10 MED ORDER — LEVOTHYROXINE SODIUM 100 MCG PO TABS: 100.0000 ug | ORAL_TABLET | Freq: Every day | ORAL | 2 refills | Status: DC

## 2019-02-10 NOTE — Addendum Note (Signed)
Addended byDamita Dunnings D on: 02/10/2019 12:26 PM   Modules accepted: Orders

## 2019-02-13 ENCOUNTER — Telehealth: Payer: Self-pay

## 2019-02-13 NOTE — Telephone Encounter (Signed)
Copied from [place] [id number]. Topic: Appointment Scheduling - Scheduling Inquiry for Clinic >> Feb 13, 2019  1:58 PM [name] [name] D wrote: Reason for CRM: Pt stated he was instructed to schedule some lab work. No answer. Please reach back out.

## 2019-02-13 NOTE — Telephone Encounter (Signed)
Patietn called back appointment scheduled for 04/12/19 @ 9:30 am.

## 2019-04-12 ENCOUNTER — Other Ambulatory Visit: Payer: Self-pay

## 2019-04-12 ENCOUNTER — Other Ambulatory Visit (INDEPENDENT_AMBULATORY_CARE_PROVIDER_SITE_OTHER): Payer: Medicare Other

## 2019-04-12 DIAGNOSIS — E039 Hypothyroidism, unspecified: Secondary | ICD-10-CM | POA: Diagnosis not present

## 2019-04-12 LAB — TSH: TSH: 0.72 u[IU]/mL (ref 0.35–4.50)

## 2019-04-14 ENCOUNTER — Encounter: Payer: Self-pay | Admitting: Internal Medicine

## 2019-04-14 MED ORDER — LEVOTHYROXINE SODIUM 100 MCG PO TABS: 100.0000 ug | ORAL_TABLET | Freq: Every day | ORAL | 1 refills | Status: DC

## 2019-04-14 NOTE — Addendum Note (Signed)
Addended byDamita Dunnings D on: 04/14/2019 08:21 AM   Modules accepted: Orders

## 2019-04-16 ENCOUNTER — Other Ambulatory Visit: Payer: Self-pay | Admitting: Internal Medicine

## 2019-06-20 DIAGNOSIS — H40013 Open angle with borderline findings, low risk, bilateral: Secondary | ICD-10-CM | POA: Diagnosis not present

## 2019-07-04 ENCOUNTER — Other Ambulatory Visit: Payer: Self-pay | Admitting: Internal Medicine

## 2019-08-02 ENCOUNTER — Encounter: Payer: Self-pay | Admitting: Internal Medicine

## 2019-08-18 ENCOUNTER — Other Ambulatory Visit: Payer: Self-pay

## 2019-08-18 NOTE — Patient Outreach (Signed)
[facility]) Care Management  08/18/2019  [name] [name] 04-Jan-1951 [id number]   Medication Adherence call to [name] HIPPA Compliant Voice message left with a call back number. [name] is showing past due on Atorvastatin 40 mg under [place].   [facility] Management Direct Dial [phone]  Fax [phone] [email or web address]

## 2019-08-27 ENCOUNTER — Encounter: Payer: Self-pay | Admitting: Internal Medicine

## 2019-08-29 ENCOUNTER — Encounter: Payer: Self-pay | Admitting: Internal Medicine

## 2019-08-29 ENCOUNTER — Other Ambulatory Visit: Payer: Self-pay

## 2019-08-29 ENCOUNTER — Ambulatory Visit (INDEPENDENT_AMBULATORY_CARE_PROVIDER_SITE_OTHER): Payer: Medicare Other | Admitting: Internal Medicine

## 2019-08-29 VITALS — BP 132/71 | HR 51 | Temp 96.9°F | Resp 16 | Ht 77.0 in | Wt 267.4 lb

## 2019-08-29 DIAGNOSIS — E119 Type 2 diabetes mellitus without complications: Secondary | ICD-10-CM | POA: Diagnosis not present

## 2019-08-29 DIAGNOSIS — Z122 Encounter for screening for malignant neoplasm of respiratory organs: Secondary | ICD-10-CM

## 2019-08-29 DIAGNOSIS — Z23 Encounter for immunization: Secondary | ICD-10-CM

## 2019-08-29 DIAGNOSIS — E785 Hyperlipidemia, unspecified: Secondary | ICD-10-CM | POA: Diagnosis not present

## 2019-08-29 DIAGNOSIS — Z09 Encounter for follow-up examination after completed treatment for conditions other than malignant neoplasm: Secondary | ICD-10-CM | POA: Diagnosis not present

## 2019-08-29 DIAGNOSIS — Z1211 Encounter for screening for malignant neoplasm of colon: Secondary | ICD-10-CM

## 2019-08-29 DIAGNOSIS — M159 Polyosteoarthritis, unspecified: Secondary | ICD-10-CM | POA: Diagnosis not present

## 2019-08-29 DIAGNOSIS — Z0000 Encounter for general adult medical examination without abnormal findings: Secondary | ICD-10-CM

## 2019-08-29 DIAGNOSIS — E039 Hypothyroidism, unspecified: Secondary | ICD-10-CM | POA: Diagnosis not present

## 2019-08-29 LAB — CBC WITH DIFFERENTIAL/PLATELET
Basophils Absolute: 0.1 10*3/uL (ref 0.0–0.1)
Basophils Relative: 0.7 % (ref 0.0–3.0)
Eosinophils Absolute: 0.4 10*3/uL (ref 0.0–0.7)
Eosinophils Relative: 5.9 % — ABNORMAL HIGH (ref 0.0–5.0)
HCT: 42.2 % (ref 39.0–52.0)
Hemoglobin: 14.3 g/dL (ref 13.0–17.0)
Lymphocytes Relative: 28.2 % (ref 12.0–46.0)
Lymphs Abs: 2.1 10*3/uL (ref 0.7–4.0)
MCHC: 33.9 g/dL (ref 30.0–36.0)
MCV: 93.5 fl (ref 78.0–100.0)
Monocytes Absolute: 0.7 10*3/uL (ref 0.1–1.0)
Monocytes Relative: 9.8 % (ref 3.0–12.0)
Neutro Abs: 4.1 10*3/uL (ref 1.4–7.7)
Neutrophils Relative %: 55.4 % (ref 43.0–77.0)
Platelets: 215 10*3/uL (ref 150.0–400.0)
RBC: 4.51 Mil/uL (ref 4.22–5.81)
RDW: 13.1 % (ref 11.5–15.5)
WBC: 7.4 10*3/uL (ref 4.0–10.5)

## 2019-08-29 LAB — LIPID PANEL
Cholesterol: 158 mg/dL (ref 0–200)
HDL: 51.7 mg/dL (ref >39.00)
LDL Cholesterol: 89 mg/dL (ref 0–99)
NonHDL: 105.87
Total CHOL/HDL Ratio: 3
Triglycerides: 84 mg/dL (ref 0.0–149.0)
VLDL: 16.8 mg/dL (ref 0.0–40.0)

## 2019-08-29 LAB — COMPREHENSIVE METABOLIC PANEL
ALT: 24 U/L (ref 0–53)
AST: 15 U/L (ref 0–37)
Albumin: 4.3 g/dL (ref 3.5–5.2)
Alkaline Phosphatase: 57 U/L (ref 39–117)
BUN: 18 mg/dL (ref 6–23)
CO2: 29 mEq/L (ref 19–32)
Calcium: 9.3 mg/dL (ref 8.4–10.5)
Chloride: 102 mEq/L (ref 96–112)
Creatinine, Ser: 1.26 mg/dL (ref 0.40–1.50)
GFR: 57.07 mL/min — ABNORMAL LOW (ref >60.00)
Glucose, Bld: 130 mg/dL — ABNORMAL HIGH (ref 70–99)
Potassium: 4.3 mEq/L (ref 3.5–5.1)
Sodium: 136 mEq/L (ref 135–145)
Total Bilirubin: 0.6 mg/dL (ref 0.2–1.2)
Total Protein: 6.4 g/dL (ref 6.0–8.3)

## 2019-08-29 LAB — TSH: TSH: 0.4 u[IU]/mL (ref 0.35–4.50)

## 2019-08-29 NOTE — Progress Notes (Signed)
Pre visit review using our clinic review tool, if applicable. No additional management support is needed unless otherwise documented below in the visit note. 

## 2019-08-29 NOTE — Patient Instructions (Signed)
GO TO THE LAB : Get the blood work     GO TO THE FRONT DESK Schedule your next appointment   for a checkup in 6 months   Check your blood pressure to 3 times a week BP GOAL is between 110/65 and  135/85. If it is consistently higher or lower, let me know

## 2019-08-29 NOTE — Progress Notes (Signed)
Shoulder pain (been operated on) Feet Sore   Not sleeping good due to hip and shoulder pain, ends up in recliner  CT scan anually  Hips sore, fingers sore Back still aches at times  Runny nose in morning, clear mucus, not on allergy medicines, does not want medication, long term problem  Urine stream not strong No burning, no frequency  Itch in groin after sweaty Not red or inflamed  DM: 85-115 Rare low blood sugar No sweaty, shaking, lightheaded  Hypothyroid: good, a little fatigue, a little weight gain - no changes in skin or nails, or hair

## 2019-08-29 NOTE — Progress Notes (Signed)
Subjective:    Patient ID: [name] [name], male    DOB: [birth year], 68 y.o.   MRN: [id number]  DOS:  08/29/2019 Type of visit - description: CPX  He has multiple concerns, we also talk about his chronic medical issues.  Reports aches and pains in multiple sites including his back, hip, feet. The back is related to moving and playing golf, described as a soreness and is taking care of by over-the-counter rubs and medications. No fever, chills, weight loss.  He does have some stiffness in the morning that is short-lived. No redness or swelling at the hand joints. He does have shoulder pain worse on the left.  Urinary flow is a slightly low but no other LUTS  He has runny nose every morning, clear, does not desire treatment.  Reports some fatigue, he specifically denies chest pain, difficulty breathing, edema, palpitations.     Review of Systems  Other than above, a 14 point review of systems is negative    Past Medical History:  Diagnosis Date  . 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 Colon polyps    Colon polyps at age 24, next Cscope age 72  . Diabetes mellitus   . DJD (degenerative joint disease)   . Glaucoma suspect   . Hyperlipidemia     Past Surgical History:  Procedure Laterality Date  . BUNIONECTOMY Right 12/27/2014   [facility]  . BUNIONECTOMY Left    2019  . NASAL SEPTUM SURGERY  2000  . SHOULDER ARTHROSCOPY     L 2011, R 2012    Social History   Socioeconomic History  . Marital status: Married    Spouse name: Not on file  . Number of children: 2  . Years of education: Not on file  . Highest education level: Not on file  Occupational History  . Occupation: disability    Employer: [employer]  . Financial resource strain: Not on file  . Food insecurity    Worry: Not on file    Inability: Not on file  . Transportation needs    Medical: Not on file    Non-medical: Not on file  Tobacco Use  . Smoking status: Former Research scientist (life sciences)  . Smokeless  tobacco: Never Used  . Tobacco comment: quit on-off, no smoking since 12/2018  Substance and Sexual Activity  . Alcohol use: Yes    Alcohol/week: 2.0 standard drinks    Types: 2 Cans of beer per week    Comment: socially   . Drug use: No  . Sexual activity: Yes  Lifestyle  . Physical activity    Days per week: Not on file    Minutes per session: Not on file  . Stress: Not on file  Relationships  . Social Herbalist on phone: Not on file    Gets together: Not on file    Attends religious service: Not on file    Active member of club or organization: Not on file    Attends meetings of clubs or organizations: Not on file    Relationship status: Not on file  . Intimate partner violence    Fear of current or ex partner: Not on file    Emotionally abused: Not on file    Physically abused: Not on file    Forced sexual activity: Not on file  Other Topics Concern  . Not on file  Social History Narrative   Household-- pt and wife    Luz Lex a lot  Family History  Problem Relation Age of Onset  . Arthritis Mother   . Alzheimer's disease Mother   . Lung cancer Father   . Stroke Other   . Diabetes Other        uncle, brother (borderline)  . GER disease Sister   . Colon cancer Neg Hx   . Prostate cancer Neg Hx   . CAD Neg Hx      Allergies as of 08/29/2019   No Known Allergies     Medication List       Accurate as of August 29, 2019 11:59 PM. If you have any questions, ask your nurse or doctor.        STOP taking these medications   HYDROcodone-ibuprofen 7.5-200 MG tablet Commonly known as: Vicoprofen Stopped by: Kathlene November, MD     TAKE these medications   Accu-Chek Aviva Plus w/Device Kit Use to check blood sugar   accu-chek soft touch lancets Check blood sugars no more than twice daily   aspirin 81 MG tablet Take 81 mg by mouth daily.   atorvastatin 40 MG tablet Commonly known as: LIPITOR Take 1 tablet (40 mg total) by mouth daily.   fish  oil-omega-3 fatty acids 1000 MG capsule Take 2 g by mouth daily.   glucose blood test strip Commonly known as: Accu-Chek Aviva Check blood sugar no more than twice daily   IBUPROFEN PO Take by mouth as needed. Reported on 01/27/2016   levothyroxine 100 MCG tablet Commonly known as: SYNTHROID Take 1 tablet (100 mcg total) by mouth daily before breakfast.   lisinopril 2.5 MG tablet Commonly known as: ZESTRIL Take 1 tablet (2.5 mg total) by mouth daily.   Melatonin 1 MG Tabs Take 1 mg by mouth daily.   metFORMIN 500 MG 24 hr tablet Commonly known as: GLUCOPHAGE-XR Take 2 tablets (1,000 mg total) by mouth 2 (two) times a day.   multivitamin tablet Take 1 tablet by mouth daily.   mupirocin cream 2 % Commonly known as: BACTROBAN Apply 1 application topically as needed. Reported on 01/27/2016   sildenafil 20 MG tablet Commonly known as: REVATIO TAKE 2 TO 3 TABLETS BY MOUTH ONCE DAILY AS NEEDED   sitaGLIPtin 100 MG tablet Commonly known as: Januvia Take 1 tablet (100 mg total) by mouth daily.   triamcinolone cream 0.1 % Commonly known as: KENALOG           Objective:   Physical Exam BP 132/71 (BP Location: Left Arm, Patient Position: Sitting, Cuff Size: Normal)   Pulse (!) 51   Temp (!) 96.9 F (36.1 C) (Temporal)   Resp 16   Ht '6\' 5"'  (1.956 m)   Wt 267 lb 6 oz (121.3 kg)   SpO2 100%   BMI 31.71 kg/m  General: Well developed, NAD, BMI noted Neck: No  thyromegaly  HEENT:  Normocephalic . Face symmetric, atraumatic Lungs:  CTA B Normal respiratory effort, no intercostal retractions, no accessory muscle use. Heart: RRR,  no murmur.  No pretibial edema bilaterally  Abdomen:  Not distended, soft, non-tender. No rebound or rigidity. MSK: Hands and wrists with no synovitis Skin: Exposed areas without rash. Not pale. Not jaundice Neurologic:  alert & oriented X3.  Speech normal, gait appropriate for age and unassisted Strength symmetric and appropriate for  age.  Psych: Cognition and judgment appear intact.  Cooperative with normal attention span and concentration.  Behavior appropriate. No anxious or depressed appearing.     Assessment  Assessment DM- on ACEi Hyperlipidemia Hypothyroidism DJD Pre-glaucoma E.D. Chronic R foot pain, s/p surgery w/ podiatry 2016  PLAN: Here for CPX DM: Currently on Januvia, Metformin, lisinopril.  Checking labs including a A1c, ambulatory CBGs typically less than 115 Hyperlipidemia: On Lipitor, check a FLP Hypothyroidism: On Synthroid, checking labs MSK: Aches and pains as described above, doubt inflammatory arthritis, likely DJD, recommend to continue judicious use of OTCs.  He has shoulder pain which is the most noticeable today, recommend to see Ortho when he is ready. Itching, groins: Usually after he is active and sweats, no actual rash, recommend to use powder or talc before exercising RTC 6 months   Today in addition to his physical exam, I spent more than 20 min with the patient: >50% of the time counseling regards multiple symptoms including MSK pain, allergies, fatigue and managing chronic medical problems.

## 2019-08-30 LAB — HEMOGLOBIN A1C: Hgb A1c MFr Bld: 6.6 % — ABNORMAL HIGH (ref 4.6–6.5)

## 2019-08-30 NOTE — Assessment & Plan Note (Addendum)
Here for CPX DM: Currently on Januvia, Metformin, lisinopril.  Checking labs including a A1c, ambulatory CBGs typically less than 115 Hyperlipidemia: On Lipitor, check a FLP Hypothyroidism: On Synthroid, checking labs MSK: Aches and pains as described above, doubt inflammatory arthritis, likely DJD, recommend to continue judicious use of OTCs.  He has shoulder pain which is the most noticeable today, recommend to see Ortho when he is ready. Itching, groins: Usually after he is active and sweats, no actual rash, recommend to use powder or talc before exercising RTC 6 months

## 2019-08-30 NOTE — Assessment & Plan Note (Signed)
-  Td 2013 - Zostavax 11-2012 - Pneumonia shot  2015.  Booster today -  prevnar:2015 - had a Flu shot  ,  -- shingrex printed last year -Prostate cancer screening--DRE- PSA wnl 2019.  Reports a slight low urinary flow but otherwise no sxs -CCS: Had a colonoscopy at age 68. No records.  Had another colonoscopy 08-2014, + polyps, repeat in 5 years per report. Refer to GI -Lung cancer screening: CT 08/2018 benign, re order  -smoker: Tobacco free since 12-2018.  -Diet and exercise discussed. -Labs: CMP, FLP, CBC, A1c, TSH

## 2019-09-01 ENCOUNTER — Ambulatory Visit (HOSPITAL_BASED_OUTPATIENT_CLINIC_OR_DEPARTMENT_OTHER)
Admission: RE | Admit: 2019-09-01 | Discharge: 2019-09-01 | Disposition: A | Discharge status: Home / Self Care | Payer: Medicare Other | Source: Ambulatory Visit | Attending: Internal Medicine | Admitting: Internal Medicine

## 2019-09-01 ENCOUNTER — Other Ambulatory Visit: Payer: Self-pay

## 2019-09-01 DIAGNOSIS — Z87891 Personal history of nicotine dependence: Secondary | ICD-10-CM | POA: Insufficient documentation

## 2019-09-01 DIAGNOSIS — Z122 Encounter for screening for malignant neoplasm of respiratory organs: Secondary | ICD-10-CM | POA: Insufficient documentation

## 2019-09-01 DIAGNOSIS — I700 Atherosclerosis of aorta: Secondary | ICD-10-CM | POA: Insufficient documentation

## 2019-09-01 IMAGING — CT CT CHEST LUNG CANCER SCREENING LOW DOSE W/O CM
2 of 4 series · 15 of 40 positions shown, 18 images · non-contrast
Comparison: Low-dose lung cancer screening CT chest dated
[DATE]

CLINICAL DATA: 68-year-old male former smoker, quit 2 months ago,
with 40 pack-year history of smoking, for follow-up lung cancer
screening

EXAM:
CT CHEST WITHOUT CONTRAST LOW-DOSE FOR LUNG CANCER SCREENING
TECHNIQUE: Multidetector CT imaging of the chest was performed following the
standard protocol without IV contrast.

[Series 2: axial st · axial · 0.96mm/px · z∈[+666,+976]mm · 12 of 68 slices shown, 15 images]
[im 3/68  mediastinal]
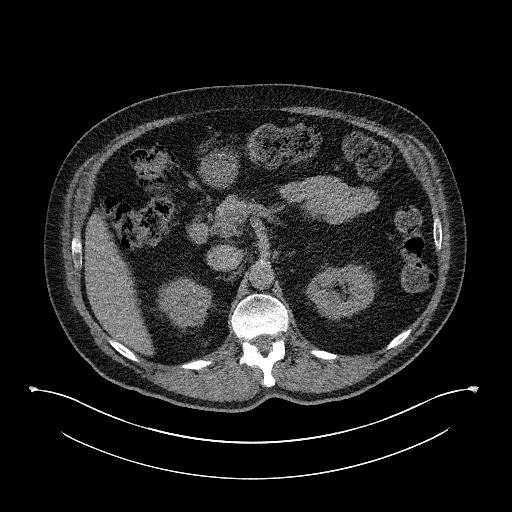
[im 3/68  lung]
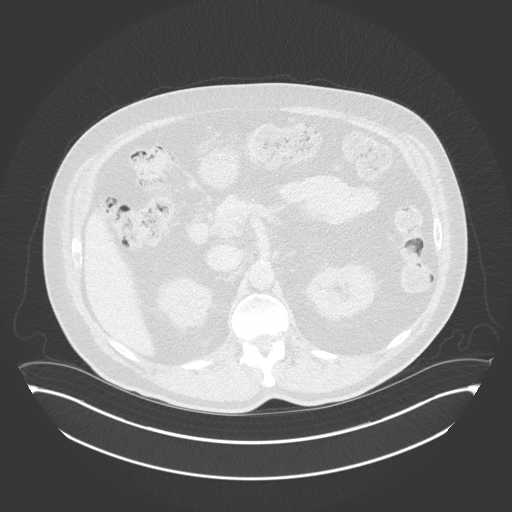
[im 9/68  lung]
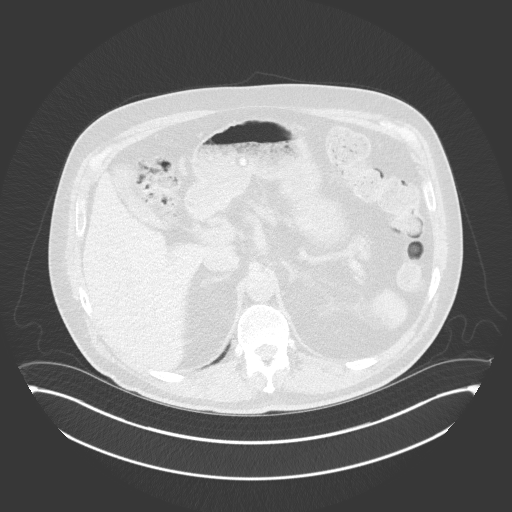
[im 14/68  lung]
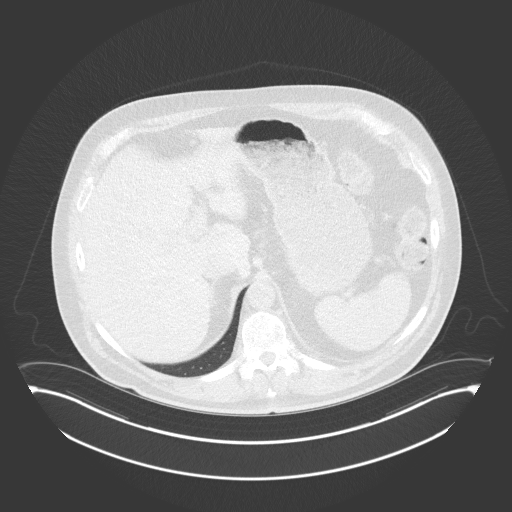
[im 20/68  lung]
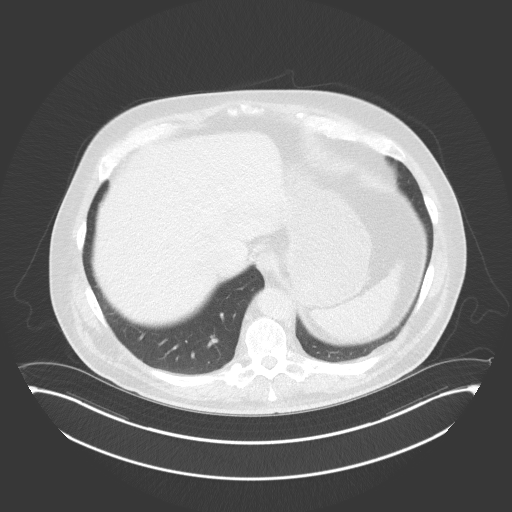
[im 26/68  mediastinal]
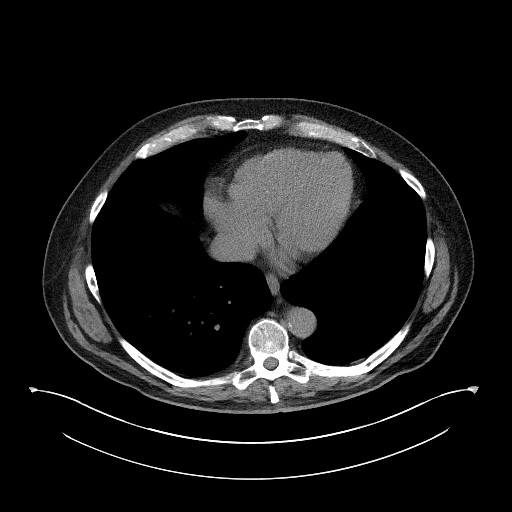
[im 26/68  lung]
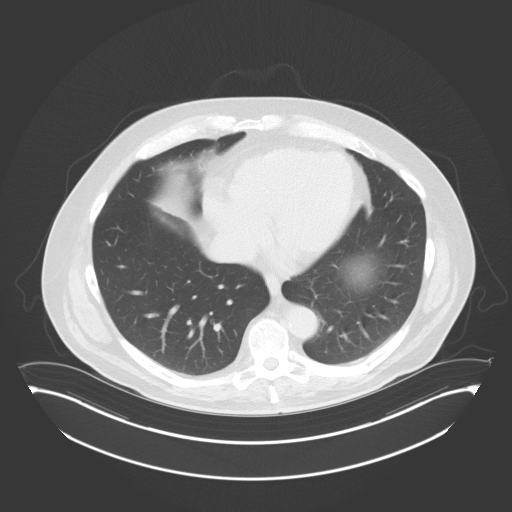
[im 31/68  lung]
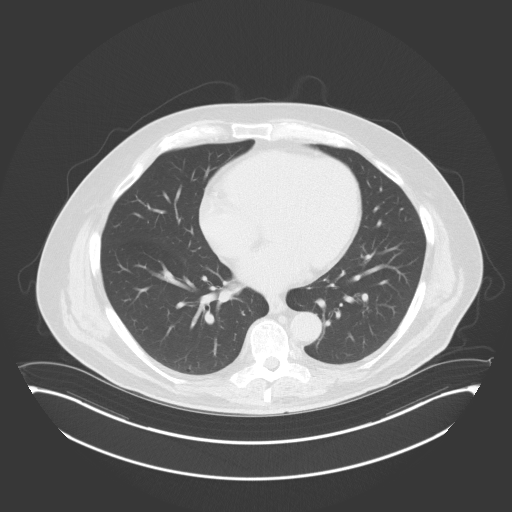
[im 37/68  lung]
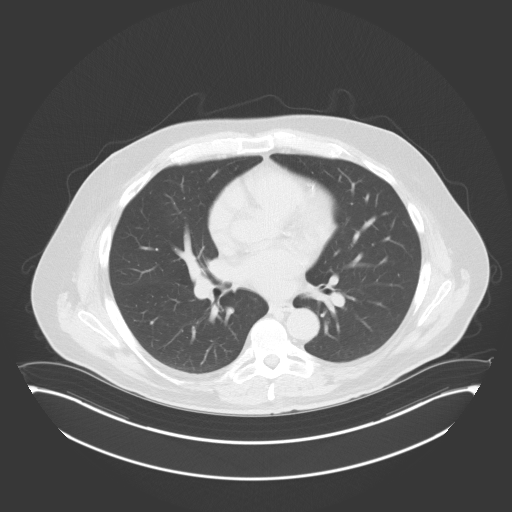
[im 42/68  lung]
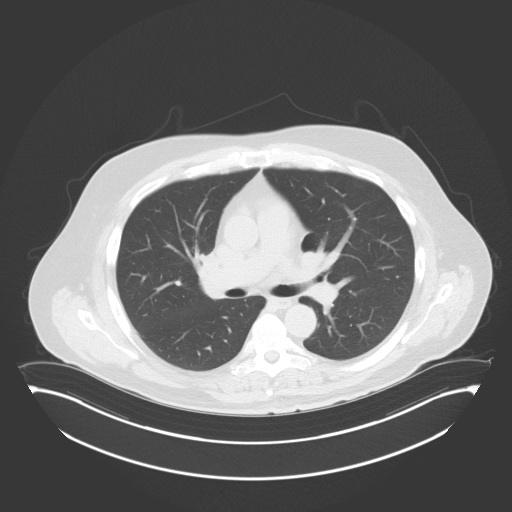
[im 48/68  mediastinal]
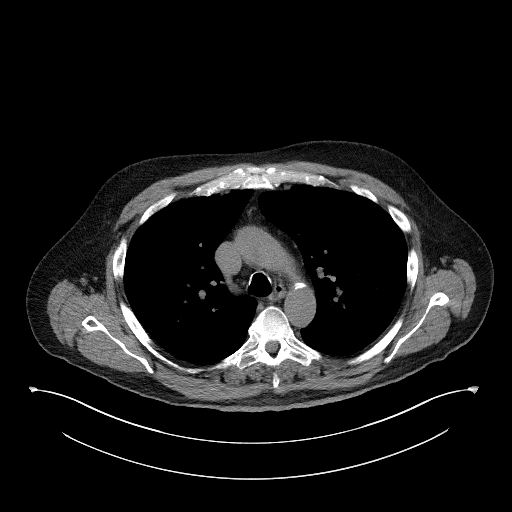
[im 48/68  lung]
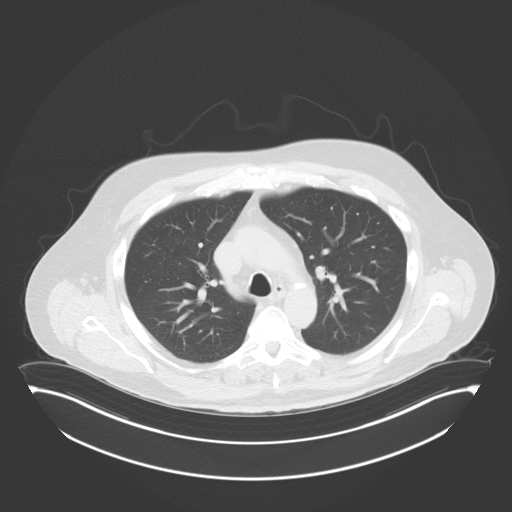
[im 54/68  lung]
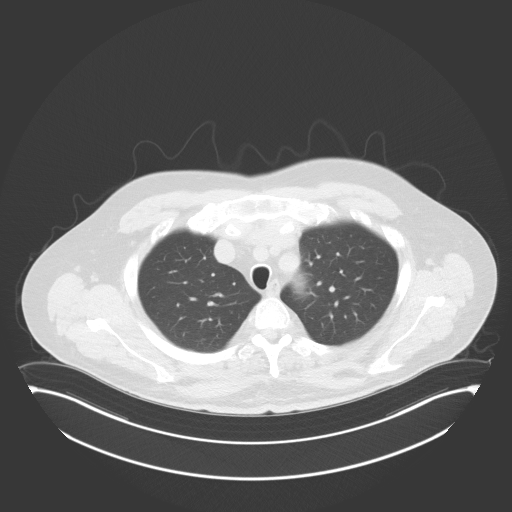
[im 59/68  lung]
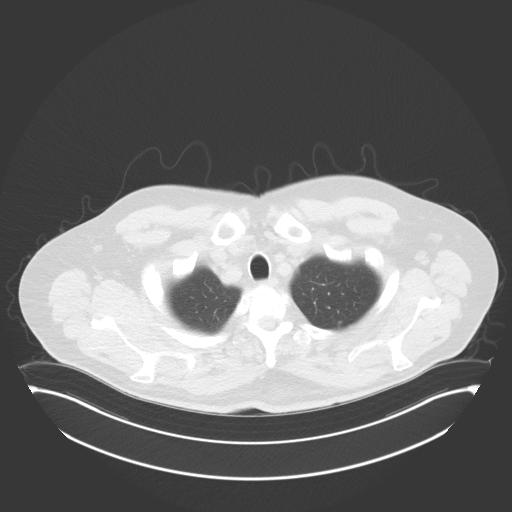
[im 65/68  lung]
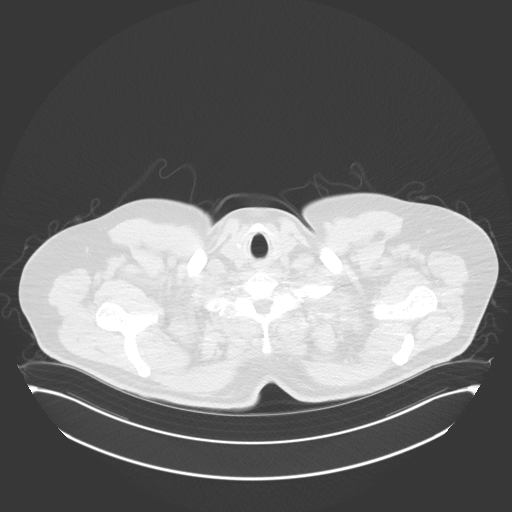

[Series 5: coronal · coronal · 0.69mm/px · 3 of 320 slices shown]
[im 64/320  lung]
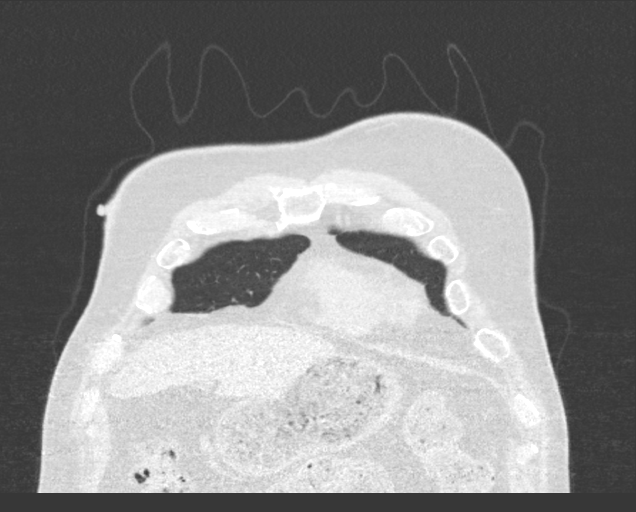
[im 128/320  lung]
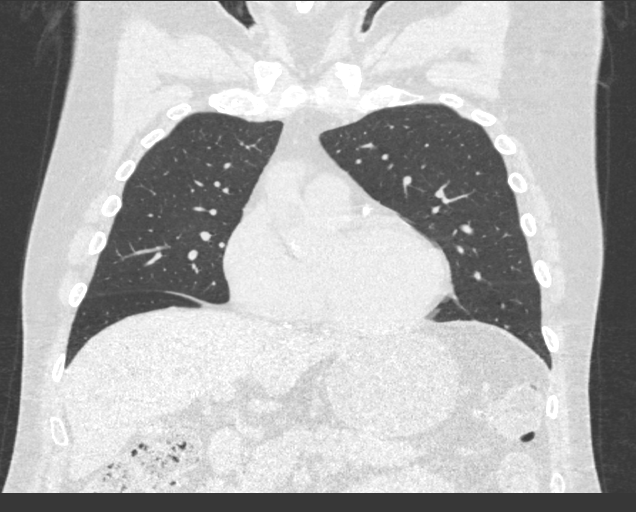
[im 192/320  lung]
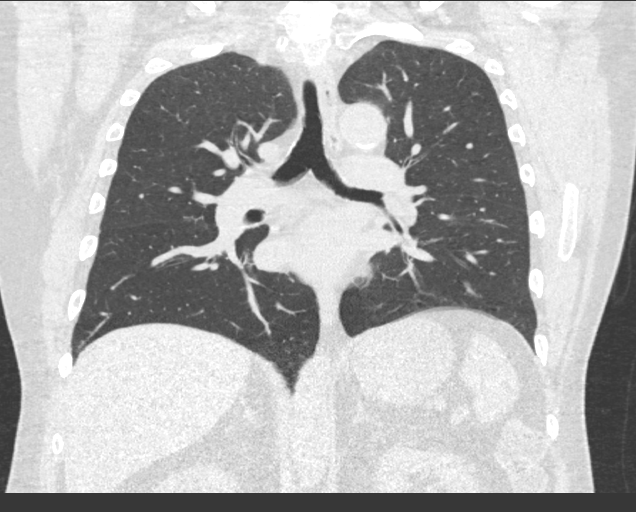

[15 of 40 positions shown; findings below may reference images not displayed]

FINDINGS: Cardiovascular: The heart is normal in size. No pericardial
effusion.

No evidence of thoracic aortic aneurysm. Mild atherosclerotic
calcifications of the aortic root/arch.

Three vessel coronary atherosclerosis.

Mediastinum/Nodes: No suspicious mediastinal lymphadenopathy.

Visualized right thyroid is notable for a 9 mm calcified nodule.

Lungs/Pleura: No focal consolidation.

Small bilateral pulmonary nodules, measuring up to 3.0 mm in the
left upper lobe.

No pleural effusion or pneumothorax.

Upper Abdomen: Visualized upper abdomen is grossly unremarkable.

Musculoskeletal: Visualized osseous structures are within normal
limits.
IMPRESSION: Lung-RADS 2, benign appearance or behavior. Continue annual
screening with low-dose chest CT without contrast in 12 months.

Aortic Atherosclerosis ([TA]-[TA]).

## 2019-09-17 ENCOUNTER — Other Ambulatory Visit: Payer: Self-pay | Admitting: Internal Medicine

## 2019-09-28 LAB — HM DIABETES EYE EXAM

## 2019-10-02 ENCOUNTER — Encounter: Payer: Self-pay | Admitting: Internal Medicine

## 2019-10-02 DIAGNOSIS — H40009 Preglaucoma, unspecified, unspecified eye: Secondary | ICD-10-CM

## 2019-10-23 ENCOUNTER — Other Ambulatory Visit: Payer: Self-pay | Admitting: Internal Medicine

## 2019-11-20 ENCOUNTER — Other Ambulatory Visit: Payer: Self-pay | Admitting: Internal Medicine

## 2019-12-21 DIAGNOSIS — E113391 Type 2 diabetes mellitus with moderate nonproliferative diabetic retinopathy without macular edema, right eye: Secondary | ICD-10-CM | POA: Diagnosis not present

## 2019-12-21 LAB — HM DIABETES EYE EXAM

## 2019-12-25 LAB — HM DIABETES EYE EXAM

## 2019-12-28 ENCOUNTER — Encounter: Payer: Self-pay | Admitting: Internal Medicine

## 2020-01-09 DIAGNOSIS — D225 Melanocytic nevi of trunk: Secondary | ICD-10-CM | POA: Diagnosis not present

## 2020-01-09 DIAGNOSIS — L918 Other hypertrophic disorders of the skin: Secondary | ICD-10-CM | POA: Diagnosis not present

## 2020-01-09 DIAGNOSIS — L821 Other seborrheic keratosis: Secondary | ICD-10-CM | POA: Diagnosis not present

## 2020-01-09 DIAGNOSIS — D2262 Melanocytic nevi of left upper limb, including shoulder: Secondary | ICD-10-CM | POA: Diagnosis not present

## 2020-01-09 DIAGNOSIS — L812 Freckles: Secondary | ICD-10-CM | POA: Diagnosis not present

## 2020-01-29 DIAGNOSIS — Z1159 Encounter for screening for other viral diseases: Secondary | ICD-10-CM | POA: Diagnosis not present

## 2020-02-01 DIAGNOSIS — Z86010 Personal history of colonic polyps: Secondary | ICD-10-CM | POA: Diagnosis not present

## 2020-02-01 DIAGNOSIS — K640 First degree hemorrhoids: Secondary | ICD-10-CM | POA: Diagnosis not present

## 2020-02-01 DIAGNOSIS — D123 Benign neoplasm of transverse colon: Secondary | ICD-10-CM | POA: Diagnosis not present

## 2020-02-01 DIAGNOSIS — K621 Rectal polyp: Secondary | ICD-10-CM | POA: Diagnosis not present

## 2020-02-01 DIAGNOSIS — D124 Benign neoplasm of descending colon: Secondary | ICD-10-CM | POA: Diagnosis not present

## 2020-02-01 LAB — HM COLONOSCOPY

## 2020-02-02 NOTE — Progress Notes (Signed)
Subjective:   [name] [name] is a 68 y.o. male who presents for Medicare Annual/Subsequent preventive examination.  Enjoys playing golf 2x/week.   Review of Systems:  Home Safety/Smoke Alarms: Feels safe in home. Smoke alarms in place.  Lives w/ wife in 2 story home. No trouble w/ stairs.  Male:   CCS- 02/01/20. Awaiting results.   PSA-  Lab Results  Component Value Date   PSA 1.41 08/08/2018   PSA 1.12 07/28/2016   PSA 1.05 10/31/2014   Eye- pt reports utd     Objective:    Vitals: BP (!) 141/67 (BP Location: Left Arm, Patient Position: Sitting, Cuff Size: Normal) Comment: all vitals done by K.Canter CMA  Pulse 62   Temp (!) 97.5 F (36.4 C) (Temporal)   Ht _0  (1.93 m)   Wt 277 lb 2 oz (125.7 kg)   SpO2 99%   BMI 33.73 kg/m   Body mass index is 33.73 kg/m.  Advanced Directives 02/05/2020 02/02/2019 01/31/2018 01/28/2017  Does Patient Have a Medical Advance Directive? No No No No  Would patient like information on creating a medical advance directive? No - Patient declined No - Patient declined No - Patient declined Yes (MAU/Ambulatory/Procedural Areas - Information given)    Tobacco Social History   Tobacco Use  Smoking Status Former Smoker  Smokeless Tobacco Never Used  Tobacco Comment   quit on-off, no smoking since 12/2018     Counseling given: Not Answered Comment: quit on-off, no smoking since 12/2018   Clinical Intake: Pain : No/denies pain     Past Medical History:  Diagnosis Date  . 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 Colon polyps    Colon polyps at age 73, next Cscope age 43  . Diabetes mellitus   . DJD (degenerative joint disease)   . Glaucoma suspect   . Hyperlipidemia    Past Surgical History:  Procedure Laterality Date  . B[facility]OMY [facility]  . BUNIONECTOMY Left    2019  . NASAL SEPTUM SURGERY  2000  . SHOULDER ARTHROSCOPY     L 2011, R 2012   Family History  Problem Relation Age of Onset  . Arthritis  Mother   . Alzheimer's disease Mother   . Lung cancer Father   . Stroke Other   . Diabetes Other        uncle, brother (borderline)  . GER disease Sister   . Colon cancer Neg Hx   . Prostate cancer Neg Hx   . CAD Neg Hx    Social History   Socioeconomic History  . Marital status: Married    Spouse name: Not on file  . Number of children: 2  . Years of education: Not on file  . Highest education level: Not on file  Occupational History  . Occupation: disability    Employer: [employer]  Tobacco Use  . Smoking status: Former Research scientist (life sciences)  . Smokeless tobacco: Never Used  . Tobacco comment: quit on-off, no smoking since 12/2018  Substance and Sexual Activity  . Alcohol use: Yes    Alcohol/week: 2.0 standard drinks    Types: 2 Cans of beer per week    Comment: socially   . Drug use: No  . Sexual activity: Yes  Other Topics Concern  . Not on file  Social History Narrative   Household-- pt and wife    Luz Lex a lot    Social Determinants of Health   Financial Resource Strain: Low Risk   .  Difficulty of Paying Living Expenses: Not hard at all  Food Insecurity: No Food Insecurity  . Worried About Charity fundraiser in the Last Year: Never true  . Ran Out of Food in the Last Year: Never true  Transportation Needs: No Transportation Needs  . Lack of Transportation (Medical): No  . Lack of Transportation (Non-Medical): No  Physical Activity:   . Days of Exercise per Week:   . Minutes of Exercise per Session:   Stress:   . Feeling of Stress :   Social Connections:   . Frequency of Communication with Friends and Family:   . Frequency of Social Gatherings with Friends and Family:   . Attends Religious Services:   . Active Member of Clubs or Organizations:   . Attends Archivist Meetings:   Marland Kitchen Marital Status:     Outpatient Encounter Medications as of 02/05/2020  Medication Sig  . aspirin 81 MG tablet Take 81 mg by mouth daily.  Marland Kitchen atorvastatin (LIPITOR) 40 MG tablet  Take 1 tablet (40 mg total) by mouth daily.  . Blood Glucose Monitoring Suppl (ACCU-CHEK AVIVA PLUS) w/Device KIT Use to check blood sugar  . fish oil-omega-3 fatty acids 1000 MG capsule Take 2 g by mouth daily.  Marland Kitchen glucose blood (ACCU-CHEK AVIVA) test strip Check blood sugar no more than twice daily  . IBUPROFEN PO Take by mouth as needed. Reported on 01/27/2016  . Lancets (ACCU-CHEK SOFT TOUCH) lancets Check blood sugars no more than twice daily  . levothyroxine (SYNTHROID) 100 MCG tablet TAKE 1 TABLET(100 MCG) BY MOUTH DAILY BEFORE BREAKFAST  . lisinopril (ZESTRIL) 2.5 MG tablet Take 1 tablet (2.5 mg total) by mouth daily.  Marland Kitchen loratadine (CLARITIN) 10 MG tablet Take 10 mg by mouth daily.  . Melatonin 1 MG TABS Take 1 mg by mouth daily.  . metFORMIN (GLUCOPHAGE-XR) 500 MG 24 hr tablet Take 2 tablets (1,000 mg total) by mouth 2 (two) times daily.  . Multiple Vitamin (MULTIVITAMIN) tablet Take 1 tablet by mouth daily.  . mupirocin cream (BACTROBAN) 2 % Apply 1 application topically as needed. Reported on 01/27/2016  . sildenafil (REVATIO) 20 MG tablet TAKE 2 TO 3 TABLETS BY MOUTH ONCE DAILY AS NEEDED  . sitaGLIPtin (JANUVIA) 100 MG tablet Take 1 tablet (100 mg total) by mouth daily.  Marland Kitchen triamcinolone cream (KENALOG) 0.1 %    No facility-administered encounter medications on file as of 02/05/2020.    Activities of Daily Living In your present state of health, do you have any difficulty performing the following activities: 02/05/2020 08/29/2019  Hearing? N N  Vision? N N  Difficulty concentrating or making decisions? N N  Walking or climbing stairs? N N  Dressing or bathing? N N  Doing errands, shopping? N N  Preparing Food and eating ? N -  Using the Toilet? N -  In the past six months, have you accidently leaked urine? N -  Do you have problems with loss of bowel control? N -  Managing your Medications? N -  Managing your Finances? N -  Housekeeping or managing your Housekeeping? N -  Some  recent data might be hidden    Patient Care Team: Colon Branch, MD as PCP - General (Internal Medicine) Idolina Primer Warnell Bureau (Optometry) Jarome Matin, MD as Consulting Physician (Dermatology) Wilford Corner, MD as Consulting Physician (Gastroenterology)   Assessment:   This is a routine wellness examination for [name] [name]. Physical assessment deferred to PCP.  Exercise Activities and  Dietary recommendations Current Exercise Habits: The patient does not participate in regular exercise at present(stays active w/ yard work and golf.), Exercise limited by: None identified Diet (meal preparation, eat out, water intake, caffeinated beverages, dairy products, fruits and vegetables): well balanced   Goals    . Increase physical activity    . [place] (pt-stated)       Fall Risk Fall Risk  02/05/2020 08/29/2019 02/02/2019 01/31/2018 01/28/2017  Falls in the past year? 0 0 0 No No  Number falls in past yr: 0 - - - -  Injury with Fall? 0 - - - -  Follow up Education provided;Falls prevention discussed Falls evaluation completed - - -   Depression Screen PHQ 2/9 Scores 02/05/2020 08/29/2019 02/02/2019 01/31/2018  PHQ - 2 Score 0 0 0 0    Cognitive Function Ad8 score reviewed for issues:  Issues making decisions:no  Less interest in hobbies / activities:no  Repeats questions, stories (family complaining):no  Trouble using ordinary gadgets (microwave, computer, phone):no  Forgets the month or year: no  Mismanaging finances: no  Remembering appts:no Daily problems with thinking and/or memory:no Ad8 score is=0     MMSE - Mini Mental State Exam 01/31/2018 01/28/2017  Orientation to time 5 5  Orientation to Place 5 5  Registration 3 3  Attention/ Calculation 5 5  Recall 3 2  Language- name 2 objects 2 2  Language- repeat 1 1  Language- follow 3 step command 3 3  Language- read & follow direction 1 1  Write a sentence 1 1  Copy design 1 1  Total score 30 29        Immunization  History  Administered Date(s) Administered  . Fluad Quad(high Dose 65+) 07/05/2019  . Influenza Split 08/04/2012, 07/27/2014  . Influenza, High Dose Seasonal PF 08/04/2017, 08/08/2018  . Influenza,inj,Quad PF,6+ Mos 08/15/2013, 08/07/2015, 07/28/2016  . Pneumococcal Conjugate-13 04/30/2014  . Pneumococcal Polysaccharide-23 10/30/2013, 08/29/2019  . Tdap 08/04/2012  . Zoster 12/05/2012   Screening Tests Health Maintenance  Topic Date Due  . FOOT EXAM  01/29/2019  . HEMOGLOBIN A1C  02/26/2020  . INFLUENZA VACCINE  05/26/2020  . OPHTHALMOLOGY EXAM  12/24/2020  . TETANUS/TDAP  08/04/2022  . COLONOSCOPY  01/31/2025  . Hepatitis C Screening  Completed  . PNA vac Low Risk Adult  Completed       Plan:    Please schedule your next medicare wellness visit with me in 1 yr.  Continue to eat heart healthy diet (full of fruits, vegetables, whole grains, lean protein, water--limit salt, fat, and sugar intake) and increase physical activity as tolerated.  Continue doing brain stimulating activities (puzzles, reading, adult coloring books, staying active) to keep memory sharp.    I have personally reviewed and noted the following in the patient's chart:   . Medical and social history . Use of alcohol, tobacco or illicit drugs  . Current medications and supplements . Functional ability and status . Nutritional status . Physical activity . Advanced directives . List of other physicians . Hospitalizations, surgeries, and ER visits in previous 12 months . Vitals . Screenings to include cognitive, depression, and falls . Referrals and appointments  In addition, I have reviewed and discussed with patient certain preventive protocols, quality metrics, and best practice recommendations. A written personalized care plan for preventive services as well as general preventive health recommendations were provided to patient.     Naaman Plummer [place], [place]  02/05/2020

## 2020-02-05 ENCOUNTER — Encounter: Payer: Self-pay | Admitting: *Deleted

## 2020-02-05 ENCOUNTER — Other Ambulatory Visit: Payer: Self-pay

## 2020-02-05 ENCOUNTER — Encounter: Payer: Self-pay | Admitting: Internal Medicine

## 2020-02-05 ENCOUNTER — Ambulatory Visit (INDEPENDENT_AMBULATORY_CARE_PROVIDER_SITE_OTHER): Payer: Medicare Other | Admitting: *Deleted

## 2020-02-05 ENCOUNTER — Ambulatory Visit (INDEPENDENT_AMBULATORY_CARE_PROVIDER_SITE_OTHER): Payer: Medicare Other | Admitting: Internal Medicine

## 2020-02-05 VITALS — BP 141/67 | HR 62 | Temp 97.5°F | Ht 76.0 in | Wt 277.1 lb

## 2020-02-05 VITALS — BP 141/67 | HR 62 | Temp 97.5°F | Resp 16 | Ht 76.0 in | Wt 277.1 lb

## 2020-02-05 DIAGNOSIS — E119 Type 2 diabetes mellitus without complications: Secondary | ICD-10-CM | POA: Diagnosis not present

## 2020-02-05 DIAGNOSIS — Z0000 Encounter for general adult medical examination without abnormal findings: Secondary | ICD-10-CM

## 2020-02-05 DIAGNOSIS — E039 Hypothyroidism, unspecified: Secondary | ICD-10-CM | POA: Diagnosis not present

## 2020-02-05 DIAGNOSIS — J309 Allergic rhinitis, unspecified: Secondary | ICD-10-CM | POA: Diagnosis not present

## 2020-02-05 LAB — BASIC METABOLIC PANEL
BUN: 12 mg/dL (ref 6–23)
CO2: 27 mEq/L (ref 19–32)
Calcium: 9 mg/dL (ref 8.4–10.5)
Chloride: 99 mEq/L (ref 96–112)
Creatinine, Ser: 1.27 mg/dL (ref 0.40–1.50)
GFR: 56.47 mL/min — ABNORMAL LOW (ref >60.00)
Glucose, Bld: 137 mg/dL — ABNORMAL HIGH (ref 70–99)
Potassium: 4.4 mEq/L (ref 3.5–5.1)
Sodium: 133 mEq/L — ABNORMAL LOW (ref 135–145)

## 2020-02-05 LAB — TSH: TSH: 0.5 u[IU]/mL (ref 0.35–4.50)

## 2020-02-05 LAB — HEMOGLOBIN A1C: Hgb A1c MFr Bld: 6.7 % — ABNORMAL HIGH (ref 4.6–6.5)

## 2020-02-05 MED ORDER — AZELASTINE HCL 0.1 % NA SOLN: 2.0000 | Freq: Every day | NASAL | 6 refills | Status: DC

## 2020-02-05 NOTE — Progress Notes (Signed)
Pre visit review using our clinic review tool, if applicable. No additional management support is needed unless otherwise documented below in the visit note. 

## 2020-02-05 NOTE — Patient Instructions (Signed)
Please schedule your next medicare wellness visit with me in 1 yr.  Continue to eat heart healthy diet (full of fruits, vegetables, whole grains, lean protein, water--limit salt, fat, and sugar intake) and increase physical activity as tolerated.  Continue doing brain stimulating activities (puzzles, reading, adult coloring books, staying active) to keep memory sharp.    Mr. [name] [name] , Thank you for taking time to come for your Medicare Wellness Visit. I appreciate your ongoing commitment to your health goals. Please review the following plan we discussed and let me know if I can assist you in the future.   These are the goals we discussed: Goals    . Increase physical activity    . [place] (pt-stated)       This is a list of the screening recommended for you and due dates:  Health Maintenance  Topic Date Due  . Complete foot exam   01/29/2019  . Hemoglobin A1C  02/26/2020  . Flu Shot  05/26/2020  . Eye exam for diabetics  12/24/2020  . Tetanus Vaccine  08/04/2022  . Colon Cancer Screening  01/31/2025  .  Hepatitis C: One time screening is recommended by Center for Disease Control  (CDC) for  adults born from [birth year] through 1964.   Completed  . Pneumonia vaccines  Completed    Preventive Care 41 Years and Older, Male Preventive care refers to lifestyle choices and visits with your health care provider that can promote health and wellness. This includes:  A yearly physical exam. This is also called an annual well check.  Regular dental and eye exams.  Immunizations.  Screening for certain conditions.  Healthy lifestyle choices, such as diet and exercise. What can I expect for my preventive care visit? Physical exam Your health care provider will check:  Height and weight. These may be used to calculate body mass index (BMI), which is a measurement that tells if you are at a healthy weight.  Heart rate and blood pressure.  Your skin for abnormal  spots. Counseling Your health care provider may ask you questions about:  Alcohol, tobacco, and drug use.  Emotional well-being.  Home and relationship well-being.  Sexual activity.  Eating habits.  History of falls.  Memory and ability to understand (cognition).  Work and work Statistician. What immunizations do I need?  Influenza (flu) vaccine  This is recommended every year. Tetanus, diphtheria, and pertussis (Tdap) vaccine  You may need a Td booster every 10 years. Varicella (chickenpox) vaccine  You may need this vaccine if you have not already been vaccinated. Zoster (shingles) vaccine  You may need this after age 28. Pneumococcal conjugate (PCV13) vaccine  One dose is recommended after age 37. Pneumococcal polysaccharide (PPSV23) vaccine  One dose is recommended after age 33. Measles, mumps, and rubella (MMR) vaccine  You may need at least one dose of MMR if you were born in 1957 or later. You may also need a second dose. Meningococcal conjugate (MenACWY) vaccine  You may need this if you have certain conditions. Hepatitis A vaccine  You may need this if you have certain conditions or if you travel or work in places where you may be exposed to hepatitis A. Hepatitis B vaccine  You may need this if you have certain conditions or if you travel or work in places where you may be exposed to hepatitis B. Haemophilus influenzae type b (Hib) vaccine  You may need this if you have certain conditions. You may receive  vaccines as individual doses or as more than one vaccine together in one shot (combination vaccines). Talk with your health care provider about the risks and benefits of combination vaccines. What tests do I need? Blood tests  Lipid and cholesterol levels. These may be checked every 5 years, or more frequently depending on your overall health.  Hepatitis C test.  Hepatitis B test. Screening  Lung cancer screening. You may have this screening  every year starting at age 14 if you have a 30-pack-year history of smoking and currently smoke or have quit within the past 15 years.  Colorectal cancer screening. All adults should have this screening starting at age 82 and continuing until age 70. Your health care provider may recommend screening at age 1 if you are at increased risk. You will have tests every 1-10 years, depending on your results and the type of screening test.  Prostate cancer screening. Recommendations will vary depending on your family history and other risks.  Diabetes screening. This is done by checking your blood sugar (glucose) after you have not eaten for a while (fasting). You may have this done every 1-3 years.  Abdominal aortic aneurysm (AAA) screening. You may need this if you are a current or former smoker.  Sexually transmitted disease (STD) testing. Follow these instructions at home: Eating and drinking  Eat a diet that includes fresh fruits and vegetables, whole grains, lean protein, and low-fat dairy products. Limit your intake of foods with high amounts of sugar, saturated fats, and salt.  Take vitamin and mineral supplements as recommended by your health care provider.  Do not drink alcohol if your health care provider tells you not to drink.  If you drink alcohol: ? Limit how much you have to 0-2 drinks a day. ? Be aware of how much alcohol is in your drink. In the [place] one drink equals one 12 oz bottle of beer (355 mL), one 5 oz glass of wine (148 mL), or one 1 oz glass of hard liquor (44 mL). Lifestyle  Take daily care of your teeth and gums.  Stay active. Exercise for at least 30 minutes on 5 or more days each week.  Do not use any products that contain nicotine or tobacco, such as cigarettes, e-cigarettes, and chewing tobacco. If you need help quitting, ask your health care provider.  If you are sexually active, practice safe sex. Use a condom or other form of protection to prevent STIs  (sexually transmitted infections).  Talk with your health care provider about taking a low-dose aspirin or statin. What's next?  Visit your health care provider once a year for a well check visit.  Ask your health care provider how often you should have your eyes and teeth checked.  Stay up to date on all vaccines. This information is not intended to replace advice given to you by your health care provider. Make sure you discuss any questions you have with your health care provider. Document Revised: 10/06/2018 Document Reviewed: 10/06/2018 Elsevier Patient Education  2020 [employer].

## 2020-02-05 NOTE — Patient Instructions (Addendum)
For allergies: Flonase or Nasonex: 2 sprays on each side of the nose in the morning  Astelin: 2 sprays on each side of the nose at night  Check the  blood pressure once or twice a month BP GOAL is between 110/65 and  135/85. If it is consistently higher or lower, let me know  GO TO THE LAB : Get the blood work     [place], [place] back for a physical exam in 6 months

## 2020-02-05 NOTE — Progress Notes (Signed)
Subjective:    Patient ID: [name] [name], male    DOB: [birth year], 69 y.o.   MRN: [id number]  DOS:  02/05/2020 Type of visit - description: Follow-up Today we talk about diabetes, thyroid disease and allergies. Has a long history of clear discharge runny nose and postnasal dripping on and off, symptoms have been more consistent lately.    Review of Systems Denies fever chills No itchy eyes or itchy nose.  No sneezing Coughs only when he has postnasal dripping.  Past Medical History:  Diagnosis Date  . 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 Colon polyps    Colon polyps at age 21, next Cscope age 44  . Diabetes mellitus   . DJD (degenerative joint disease)   . Glaucoma suspect   . Hyperlipidemia     Past Surgical History:  Procedure Laterality Date  . B[facility]27/[facility]  . BUNIONECTOMY Left    2019  . NASAL SEPTUM SURGERY  2000  . SHOULDER ARTHROSCOPY     L 2011, R 2012    Allergies as of 02/05/2020   No Known Allergies     Medication List       Accurate as of February 05, 2020 11:59 PM. If you have any questions, ask your nurse or doctor.        Accu-Chek Aviva Plus w/Device Kit Use to check blood sugar   accu-chek soft touch lancets Check blood sugars no more than twice daily   aspirin 81 MG tablet Take 81 mg by mouth daily.   atorvastatin 40 MG tablet Commonly known as: LIPITOR Take 1 tablet (40 mg total) by mouth daily.   azelastine 0.1 % nasal spray Commonly known as: ASTELIN Place 2 sprays into both nostrils at bedtime. Started by: Kathlene November, MD   fish oil-omega-3 fatty acids 1000 MG capsule Take 2 g by mouth daily.   glucose blood test strip Commonly known as: Accu-Chek Aviva Check blood sugar no more than twice daily   IBUPROFEN PO Take by mouth as needed. Reported on 01/27/2016   levothyroxine 100 MCG tablet Commonly known as: SYNTHROID TAKE 1 TABLET(100 MCG) BY MOUTH DAILY BEFORE BREAKFAST   lisinopril 2.5 MG  tablet Commonly known as: ZESTRIL Take 1 tablet (2.5 mg total) by mouth daily.   loratadine 10 MG tablet Commonly known as: CLARITIN Take 10 mg by mouth daily.   melatonin 1 MG Tabs tablet Take 1 mg by mouth daily.   metFORMIN 500 MG 24 hr tablet Commonly known as: GLUCOPHAGE-XR Take 2 tablets (1,000 mg total) by mouth 2 (two) times daily.   multivitamin tablet Take 1 tablet by mouth daily.   mupirocin cream 2 % Commonly known as: BACTROBAN Apply 1 application topically as needed. Reported on 01/27/2016   sildenafil 20 MG tablet Commonly known as: REVATIO TAKE 2 TO 3 TABLETS BY MOUTH ONCE DAILY AS NEEDED   sitaGLIPtin 100 MG tablet Commonly known as: Januvia Take 1 tablet (100 mg total) by mouth daily.   triamcinolone cream 0.1 % Commonly known as: KENALOG          Objective:   Physical Exam BP (!) 141/67 (BP Location: Left Arm, Patient Position: Sitting, Cuff Size: Normal)   Pulse 62   Temp (!) 97.5 F (36.4 C) (Temporal)   Resp 16   Ht _1  (1.93 m)   Wt 277 lb 2 oz (125.7 kg)   SpO2 99%   BMI 33.73 kg/m  General:  Well developed, NAD, BMI noted. HEENT:  Normocephalic . Face symmetric, atraumatic. TMs normal Throat symmetric not red Sinuses no TTP Lungs:  CTA B Normal respiratory effort, no intercostal retractions, no accessory muscle use. Heart: RRR,  no murmur.  Lower extremities: no pretibial edema bilaterally  Skin: Not pale. Not jaundice Neurologic:  alert & oriented X3.  Speech normal, gait appropriate for age and unassisted Psych--  Cognition and judgment appear intact.  Cooperative with normal attention span and concentration.  Behavior appropriate. No anxious or depressed appearing.      Assessment     Assessment DM- on ACEi Hyperlipidemia Hypothyroidism DJD Pre-glaucoma E.D. Chronic R foot pain, s/p surgery w/ podiatry 2016  PLAN: DM: Continue lisinopril, Metformin, Januvia.  Check a BMP and A1c Hypothyroidism: On  Synthroid, check a TSH Allergic rhinitis: As described above, symptoms decreased with Claritin. Recommend consistent use of Flonase and Astelin, after a few months he could try to stay on one or the other.  If topical therapy works, okay to [employer].  Preventive care: Had a colonoscopy last week, pathology report pending Had Covid shot x2 RTC CPX 6 months    This visit occurred during the SARS-CoV-2 public health emergency.  Safety protocols were in place, including screening questions prior to the visit, additional usage of staff PPE, and extensive cleaning of exam room while observing appropriate contact time as indicated for disinfecting solutions.

## 2020-02-06 DIAGNOSIS — D123 Benign neoplasm of transverse colon: Secondary | ICD-10-CM | POA: Diagnosis not present

## 2020-02-06 DIAGNOSIS — D124 Benign neoplasm of descending colon: Secondary | ICD-10-CM | POA: Diagnosis not present

## 2020-02-06 DIAGNOSIS — J309 Allergic rhinitis, unspecified: Secondary | ICD-10-CM | POA: Insufficient documentation

## 2020-02-06 NOTE — Assessment & Plan Note (Signed)
DM: Continue lisinopril, Metformin, Januvia.  Check a BMP and A1c Hypothyroidism: On Synthroid, check a TSH Allergic rhinitis: As described above, symptoms decreased with Claritin. Recommend consistent use of Flonase and Astelin, after a few months he could try to stay on one or the other.  If topical therapy works, okay to [employer].  Preventive care: Had a colonoscopy last week, pathology report pending Had Covid shot x2 RTC CPX 6 months

## 2020-04-26 ENCOUNTER — Other Ambulatory Visit: Payer: Self-pay

## 2020-04-26 MED ORDER — LEVOTHYROXINE SODIUM 100 MCG PO TABS: ORAL_TABLET | ORAL | 1 refills | Status: DC

## 2020-05-07 ENCOUNTER — Other Ambulatory Visit: Payer: Self-pay | Admitting: Internal Medicine

## 2020-05-22 ENCOUNTER — Encounter: Payer: Self-pay | Admitting: Internal Medicine

## 2020-06-26 ENCOUNTER — Other Ambulatory Visit: Payer: Self-pay | Admitting: Internal Medicine

## 2020-06-27 DIAGNOSIS — H40013 Open angle with borderline findings, low risk, bilateral: Secondary | ICD-10-CM | POA: Diagnosis not present

## 2020-07-22 ENCOUNTER — Encounter: Payer: Self-pay | Admitting: Internal Medicine

## 2020-07-23 MED ORDER — LEVOTHYROXINE SODIUM 100 MCG PO TABS: 100.0000 ug | ORAL_TABLET | Freq: Every day | ORAL | 0 refills | Status: DC

## 2020-08-21 ENCOUNTER — Ambulatory Visit (INDEPENDENT_AMBULATORY_CARE_PROVIDER_SITE_OTHER): Payer: Medicare Other | Admitting: Internal Medicine

## 2020-08-21 ENCOUNTER — Other Ambulatory Visit: Payer: Self-pay

## 2020-08-21 ENCOUNTER — Encounter: Payer: Self-pay | Admitting: Internal Medicine

## 2020-08-21 VITALS — BP 134/72 | HR 48 | Temp 97.9°F | Resp 18 | Ht 76.0 in | Wt 273.0 lb

## 2020-08-21 DIAGNOSIS — Z0000 Encounter for general adult medical examination without abnormal findings: Secondary | ICD-10-CM | POA: Diagnosis not present

## 2020-08-21 DIAGNOSIS — E039 Hypothyroidism, unspecified: Secondary | ICD-10-CM

## 2020-08-21 DIAGNOSIS — E785 Hyperlipidemia, unspecified: Secondary | ICD-10-CM

## 2020-08-21 DIAGNOSIS — E119 Type 2 diabetes mellitus without complications: Secondary | ICD-10-CM

## 2020-08-21 NOTE — Patient Instructions (Addendum)
Check the  blood pressure once a month BP GOAL is between 110/65 and  135/85. If it is consistently higher or lower, let me know  Okay to proceed with the Shingrix vaccination   GO TO THE LAB : Get the blood work     [place], [place] back for a checkup in 6 months

## 2020-08-21 NOTE — Progress Notes (Signed)
Pre visit review using our clinic review tool, if applicable. No additional management support is needed unless otherwise documented below in the visit note. 

## 2020-08-21 NOTE — Progress Notes (Signed)
Subjective:    Patient ID: [name] [name], male    DOB: 11/19/1950, 69 y.o.   MRN: [id number]  DOS:  08/21/2020 Type of visit - description: CPX In general feels well. No major concerns. He does have occasionally right-sided low back pain, sporadic, with no radiation, fever, chills, rash. No lower extremity paresthesias   Review of Systems  Other than above, a 14 point review of systems is negative     Past Medical History:  Diagnosis Date  . 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 Colon polyps    Colon polyps at age 41, next Cscope age 82  . Diabetes mellitus   . DJD (degenerative joint disease)   . Glaucoma suspect   . Hyperlipidemia     Past Surgical History:  Procedure Laterality Date  . BUNIONECTOMY Right 12/27/2014   [facility]  . BUNIONECTOMY Left    2019  . NASAL SEPTUM SURGERY  2000  . SHOULDER ARTHROSCOPY     L 2011, R 2012    Allergies as of 08/21/2020   No Known Allergies     Medication List       Accurate as of August 21, 2020 11:59 PM. If you have any questions, ask your nurse or doctor.        Accu-Chek Aviva Plus w/Device Kit Use to check blood sugar   accu-chek soft touch lancets Check blood sugars no more than twice daily   aspirin 81 MG tablet Take 81 mg by mouth daily.   atorvastatin 40 MG tablet Commonly known as: LIPITOR Take 1 tablet (40 mg total) by mouth daily.   azelastine 0.1 % nasal spray Commonly known as: ASTELIN Place 2 sprays into both nostrils at bedtime.   fish oil-omega-3 fatty acids 1000 MG capsule Take 2 g by mouth daily.   glucose blood test strip Commonly known as: Accu-Chek Aviva Check blood sugar no more than twice daily   IBUPROFEN PO Take by mouth as needed. Reported on 01/27/2016   levothyroxine 100 MCG tablet Commonly known as: SYNTHROID Take 1 tablet (100 mcg total) by mouth daily before breakfast.   lisinopril 2.5 MG tablet Commonly known as: ZESTRIL Take 1 tablet (2.5 mg total) by mouth  daily.   loratadine 10 MG tablet Commonly known as: CLARITIN Take 10 mg by mouth daily.   melatonin 1 MG Tabs tablet Take 1 mg by mouth daily.   metFORMIN 500 MG 24 hr tablet Commonly known as: GLUCOPHAGE-XR Take 2 tablets (1,000 mg total) by mouth 2 (two) times daily.   multivitamin tablet Take 1 tablet by mouth daily.   mupirocin cream 2 % Commonly known as: BACTROBAN Apply 1 application topically as needed. Reported on 01/27/2016   sildenafil 20 MG tablet Commonly known as: REVATIO TAKE 2 TO 3 TABLETS BY MOUTH ONCE DAILY AS NEEDED   sitaGLIPtin 100 MG tablet Commonly known as: Januvia Take 1 tablet (100 mg total) by mouth daily.   triamcinolone cream 0.1 % Commonly known as: KENALOG          Objective:   Physical Exam BP 134/72 (BP Location: Left Arm, Patient Position: Sitting, Cuff Size: Normal)   Pulse (!) 48   Temp 97.9 F (36.6 C) (Oral)   Resp 18   Ht '6\' 4"'  (1.93 m)   Wt 273 lb (123.8 kg)   SpO2 99%   BMI 33.23 kg/m   General: Well developed, NAD, BMI noted Neck: No  thyromegaly  HEENT:  Normocephalic . Face symmetric,  atraumatic Lungs:  CTA B Normal respiratory effort, no intercostal retractions, no accessory muscle use. Heart: RRR,  no murmur.  Abdomen:  Not distended, soft, non-tender. No rebound or rigidity.   DM foot exam: No edema, good pedal pulses, pinprick examination normal months DRE: Normal sphincter tone, no stool, prostate normal Skin: Exposed areas without rash. Not pale. Not jaundice Neurologic:  alert & oriented X3.  Speech normal, gait appropriate for age and unassisted Strength symmetric and appropriate for age.  Psych: Cognition and judgment appear intact.  Cooperative with normal attention span and concentration.  Behavior appropriate. No anxious or depressed appearing.     Assessment     Assessment DM- on ACEi Hyperlipidemia Hypothyroidism DJD Pre-glaucoma E.D. Chronic R foot pain, s/p surgery w/ podiatry  2016  PLAN: Here for CPX DM: On Januvia, Metformin, CBGs in the morning between 90 and 110.  Checking labs.  Foot exam normal. Hyperlipidemia: On atorvastatin, checking labs. EKG: Sinus bradycardia, similar to 2014, he is asymptomatic, not on beta-blockers.  Observation. Hypothyroidism: On Synthroid, labs. DJD: Takes ibuprofen sporadically, occasionally has low back pain, no red flags.  To call if pain gets intense, worse. Allergic rhinitis: Excellent response to nasal sprays, okay to decrease to 1 spray B.I.D. on and off. RTC 6 months  This visit occurred during the SARS-CoV-2 public health emergency.  Safety protocols were in place, including screening questions prior to the visit, additional usage of staff PPE, and extensive cleaning of exam room while observing appropriate contact time as indicated for disinfecting solutions.

## 2020-08-22 ENCOUNTER — Encounter: Payer: Self-pay | Admitting: Internal Medicine

## 2020-08-22 LAB — CBC WITH DIFFERENTIAL/PLATELET
Absolute Monocytes: 580 cells/uL (ref 200–950)
Basophils Absolute: 50 cells/uL (ref 0–200)
Basophils Relative: 0.8 %
Eosinophils Absolute: 19 cells/uL (ref 15–500)
Eosinophils Relative: 0.3 %
HCT: 42.3 % (ref 38.5–50.0)
Hemoglobin: 14.1 g/dL (ref 13.2–17.1)
Lymphs Abs: 1733 cells/uL (ref 850–3900)
MCH: 31.8 pg (ref 27.0–33.0)
MCHC: 33.3 g/dL (ref 32.0–36.0)
MCV: 95.3 fL (ref 80.0–100.0)
MPV: 11.7 fL (ref 7.5–12.5)
Monocytes Relative: 9.2 %
Neutro Abs: 3919 cells/uL (ref 1500–7800)
Neutrophils Relative %: 62.2 %
Platelets: 215 10*3/uL (ref 140–400)
RBC: 4.44 10*6/uL (ref 4.20–5.80)
RDW: 12.8 % (ref 11.0–15.0)
Total Lymphocyte: 27.5 %
WBC: 6.3 10*3/uL (ref 3.8–10.8)

## 2020-08-22 LAB — COMPREHENSIVE METABOLIC PANEL
AG Ratio: 2 (calc) (ref 1.0–2.5)
ALT: 15 U/L (ref 9–46)
AST: 13 U/L (ref 10–35)
Albumin: 4.3 g/dL (ref 3.6–5.1)
Alkaline phosphatase (APISO): 57 U/L (ref 35–144)
BUN/Creatinine Ratio: 13 (calc) (ref 6–22)
BUN: 17 mg/dL (ref 7–25)
CO2: 25 mmol/L (ref 20–32)
Calcium: 9.2 mg/dL (ref 8.6–10.3)
Chloride: 100 mmol/L (ref 98–110)
Creat: 1.31 mg/dL — ABNORMAL HIGH (ref 0.70–1.25)
Globulin: 2.2 g/dL (calc) (ref 1.9–3.7)
Glucose, Bld: 119 mg/dL — ABNORMAL HIGH (ref 65–99)
Potassium: 5.1 mmol/L (ref 3.5–5.3)
Sodium: 135 mmol/L (ref 135–146)
Total Bilirubin: 1 mg/dL (ref 0.2–1.2)
Total Protein: 6.5 g/dL (ref 6.1–8.1)

## 2020-08-22 LAB — LIPID PANEL
Cholesterol: 140 mg/dL (ref <200)
HDL: 53 mg/dL (ref >=40)
LDL Cholesterol (Calc): 73 mg/dL (calc)
Non-HDL Cholesterol (Calc): 87 mg/dL (calc) (ref <130)
Total CHOL/HDL Ratio: 2.6 (calc) (ref <5.0)
Triglycerides: 59 mg/dL (ref <150)

## 2020-08-22 LAB — HEMOGLOBIN A1C
Hgb A1c MFr Bld: 6.4 % of total Hgb — ABNORMAL HIGH (ref <5.7)
Mean Plasma Glucose: 137 (calc)
eAG (mmol/L): 7.6 (calc)

## 2020-08-22 LAB — TSH: TSH: 0.46 mIU/L (ref 0.40–4.50)

## 2020-08-22 LAB — PSA: PSA: 1.44 ng/mL (ref <=4.0)

## 2020-08-22 NOTE — Assessment & Plan Note (Signed)
-  Td 2013 - Zostavax 11-2012 -Shingrix: Plans to proceed soon - Pneumonia 23: 2015 and 2020;  prevnar:2015 -Had COVID vaccines x3 - had a Flu shot   -Prostate cancer screening: DRE normal, no symptoms, check a PSA. -CCS: C-scope at age 69. No records.  C-scope 08-2014, + polyps C-scope 01-2020, next per GI -Lung cancer screening: Next CT 08-2020 -smoker: Tobacco free since 12-2018.  Congratulated.  -Diet and exercise discussed. -Labs: CMP, FLP, CBC, A1c, TSH, PSA

## 2020-08-22 NOTE — Assessment & Plan Note (Signed)
Here for CPX DM: On Januvia, Metformin, CBGs in the morning between 90 and 110.  Checking labs.  Foot exam normal. Hyperlipidemia: On atorvastatin, checking labs. EKG: Sinus bradycardia, similar to 2014, he is asymptomatic, not on beta-blockers.  Observation. Hypothyroidism: On Synthroid, labs. DJD: Takes ibuprofen sporadically, occasionally has low back pain, no red flags.  To call if pain gets intense, worse. Allergic rhinitis: Excellent response to nasal sprays, okay to decrease to 1 spray B.I.D. on and off. RTC 6 months

## 2020-09-23 ENCOUNTER — Other Ambulatory Visit: Payer: Self-pay | Admitting: Internal Medicine

## 2020-10-03 DIAGNOSIS — Z87891 Personal history of nicotine dependence: Secondary | ICD-10-CM

## 2020-10-03 DIAGNOSIS — Z122 Encounter for screening for malignant neoplasm of respiratory organs: Secondary | ICD-10-CM

## 2020-10-15 ENCOUNTER — Other Ambulatory Visit: Payer: Self-pay | Admitting: *Deleted

## 2020-10-15 DIAGNOSIS — Z87891 Personal history of nicotine dependence: Secondary | ICD-10-CM

## 2020-11-20 ENCOUNTER — Other Ambulatory Visit: Payer: Self-pay

## 2020-11-20 ENCOUNTER — Encounter: Payer: Self-pay | Admitting: Acute Care

## 2020-11-20 ENCOUNTER — Ambulatory Visit (INDEPENDENT_AMBULATORY_CARE_PROVIDER_SITE_OTHER): Payer: Medicare Other | Admitting: Acute Care

## 2020-11-20 ENCOUNTER — Ambulatory Visit (HOSPITAL_BASED_OUTPATIENT_CLINIC_OR_DEPARTMENT_OTHER)
Admission: RE | Admit: 2020-11-20 | Discharge: 2020-11-20 | Disposition: A | Discharge status: Home / Self Care | Payer: Medicare Other | Source: Ambulatory Visit | Attending: Acute Care | Admitting: Acute Care

## 2020-11-20 VITALS — BP 122/72 | HR 67 | Temp 96.9°F | Ht 77.0 in | Wt 278.4 lb

## 2020-11-20 DIAGNOSIS — Z87891 Personal history of nicotine dependence: Secondary | ICD-10-CM

## 2020-11-20 DIAGNOSIS — Z122 Encounter for screening for malignant neoplasm of respiratory organs: Secondary | ICD-10-CM

## 2020-11-20 IMAGING — CT CT CHEST LUNG CANCER SCREENING LOW DOSE W/O CM
2 of 4 series · 13 of 40 positions shown, 16 images · non-contrast
Comparison: Low-dose lung cancer screening chest CT [DATE].

CLINICAL DATA: 69-year-old male former smoker (quit 2 years ago)
with 56 pack-year history of smoking. Lung cancer screening
examination.

EXAM:
CT CHEST WITHOUT CONTRAST LOW-DOSE FOR LUNG CANCER SCREENING
TECHNIQUE: Multidetector CT imaging of the chest was performed following the
standard protocol without IV contrast.

[Series 2: axial st · axial · 0.81mm/px · z∈[-303,-78]mm · 10 of 55 slices shown, 13 images]
[im 5/55  mediastinal]
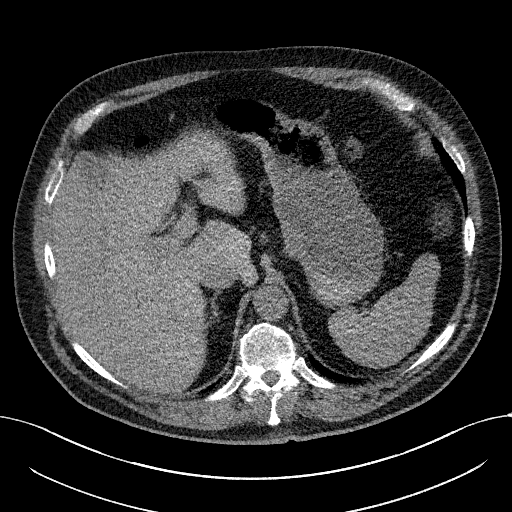
[im 5/55  lung]
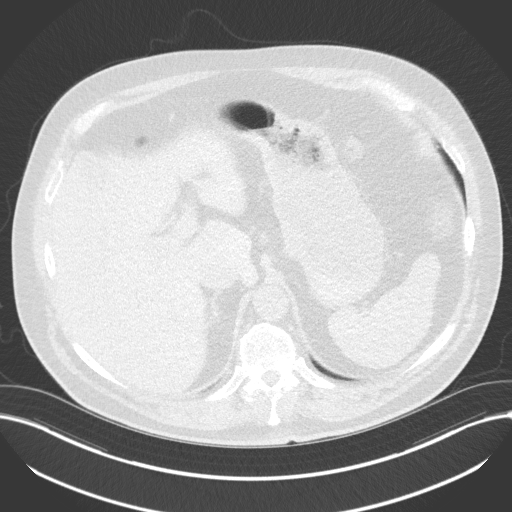
[im 9/55  lung]
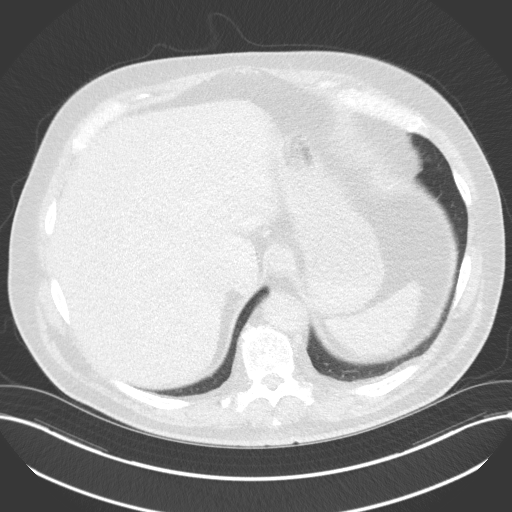
[im 16/55  lung]
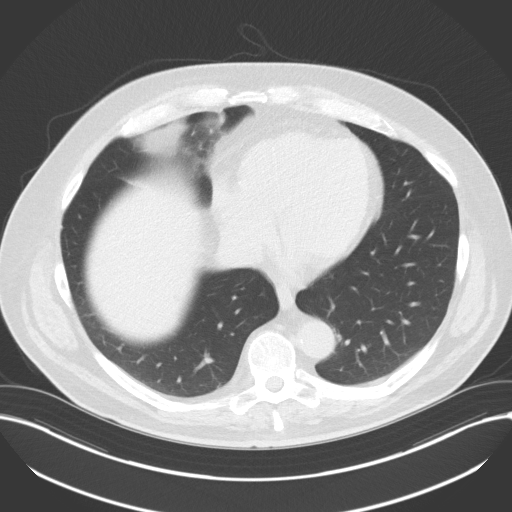
[im 20/55  lung]
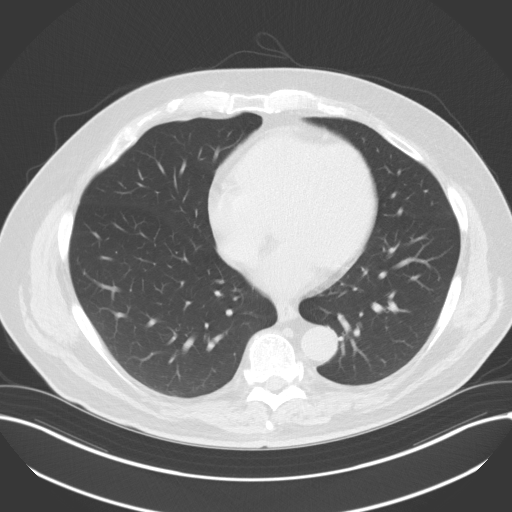
[im 24/55  mediastinal]
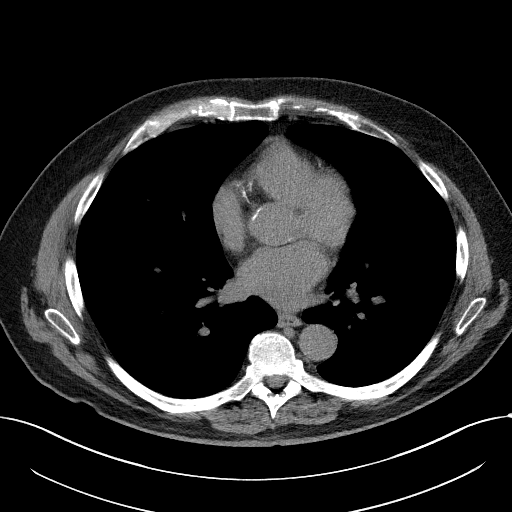
[im 24/55  lung]
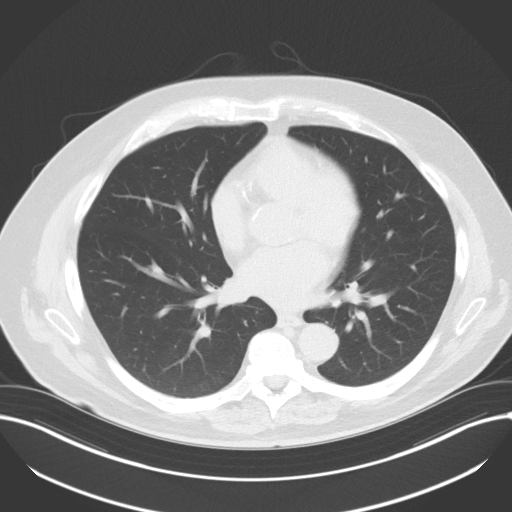
[im 31/55  lung]
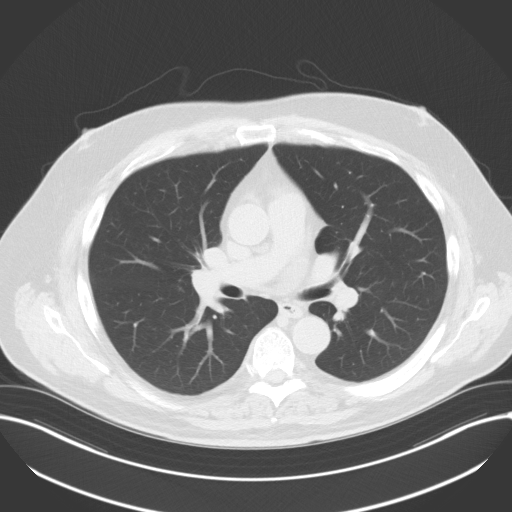
[im 35/55  lung]
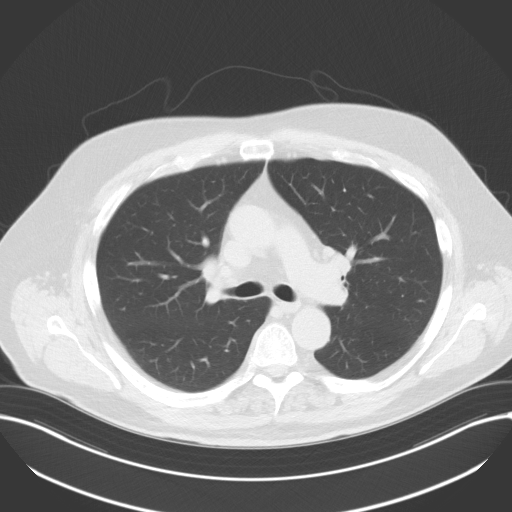
[im 42/55  lung]
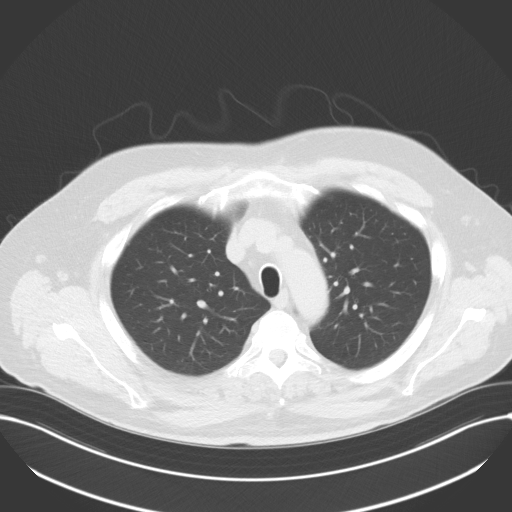
[im 46/55  mediastinal]
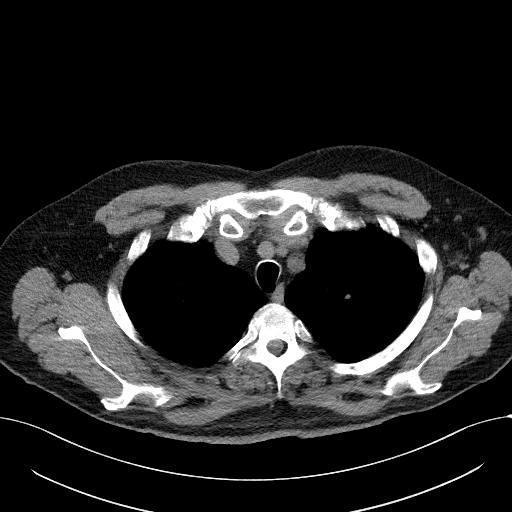
[im 46/55  lung]
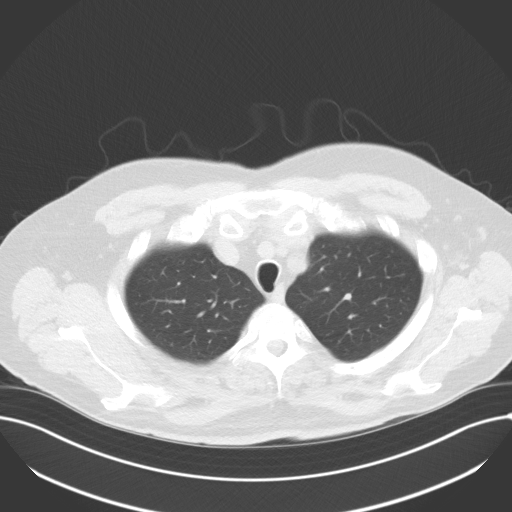
[im 50/55  lung]
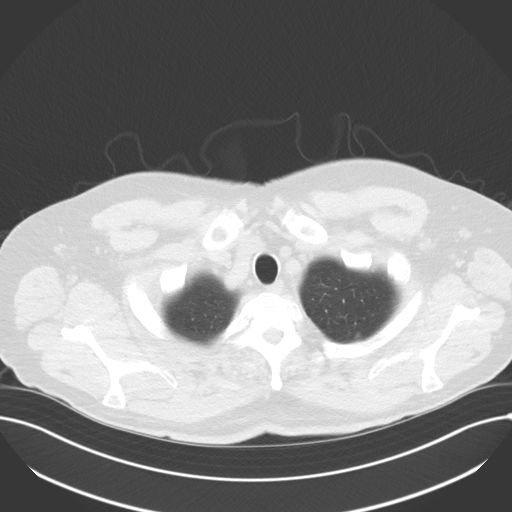

[Series 5: coronal · coronal · 0.56mm/px · 3 of 322 slices shown]
[im 65/322  lung]
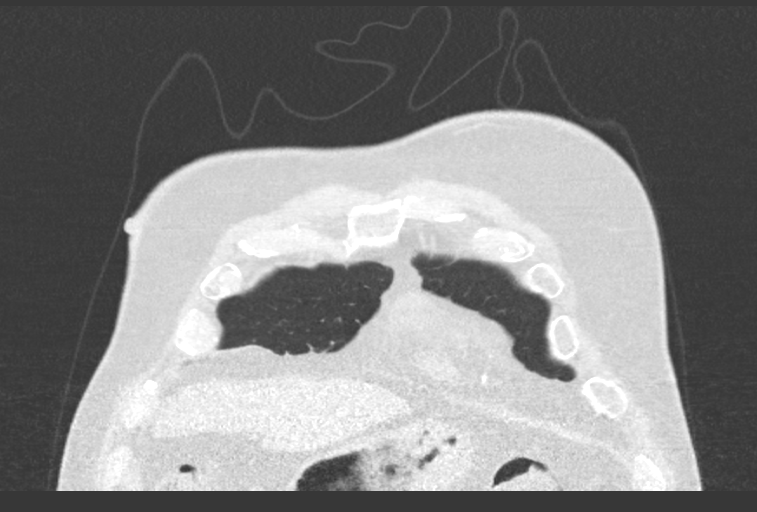
[im 129/322  lung]
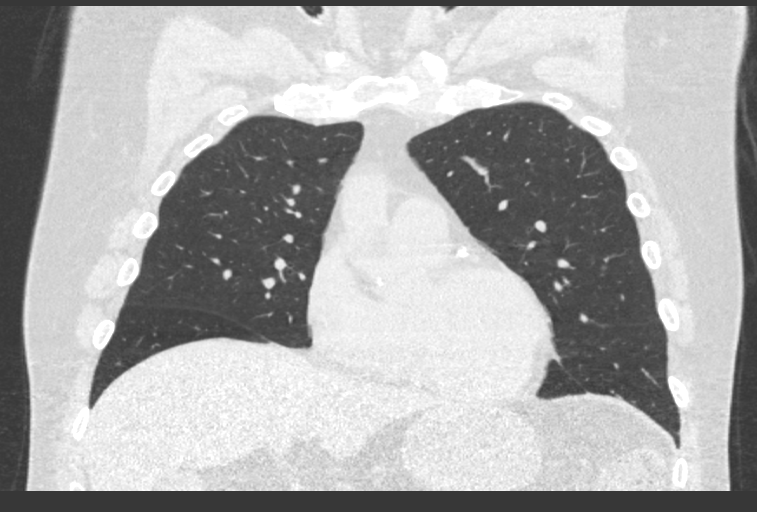
[im 193/322  lung]
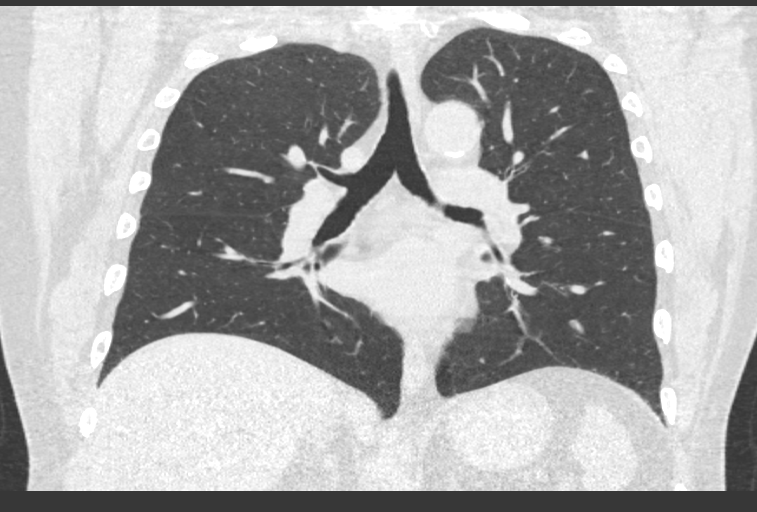

[13 of 40 positions shown; findings below may reference images not displayed]

FINDINGS: Cardiovascular: Heart size is normal. There is no significant
pericardial fluid, thickening or pericardial calcification. There is
aortic atherosclerosis, as well as atherosclerosis of the great
vessels of the mediastinum and the coronary arteries, including
calcified atherosclerotic plaque in the left main, left anterior
descending, left circumflex and right coronary arteries. Mild
calcifications of the aortic valve.

Mediastinum/Nodes: No pathologically enlarged mediastinal or hilar
lymph nodes. Please note that accurate exclusion of hilar adenopathy
is limited on noncontrast CT scans. Esophagus is unremarkable in
appearance. No axillary lymphadenopathy.

Lungs/Pleura: Multiple tiny pulmonary nodules are again noted
throughout the lungs bilaterally, largest of which is near the apex
of the left upper lobe (axial image 27 of series 3), with a volume
derived mean diameter of 3.3 mm. No other larger more suspicious
appearing pulmonary nodules or masses are noted. No acute
consolidative airspace disease. No pleural effusions. Mild diffuse
bronchial wall thickening with very mild centrilobular and
paraseptal emphysema.

Upper Abdomen: Unremarkable.

Musculoskeletal: There are no aggressive appearing lytic or blastic
lesions noted in the visualized portions of the skeleton.
IMPRESSION: 1. Lung-RADS 2S, benign appearance or behavior. Continue annual
screening with low-dose chest CT without contrast in 12 months.
2. The "S" modifier above refers to potentially clinically
significant non lung cancer related findings. Specifically, there is
aortic atherosclerosis, in addition to left main and 3 vessel
coronary artery disease. Please note that although the presence of
coronary artery calcium documents the presence of coronary artery
disease, the severity of this disease and any potential stenosis
cannot be assessed on this non-gated CT examination. Assessment for
potential risk factor modification, dietary therapy or pharmacologic
therapy may be warranted, if clinically indicated.
3. Mild diffuse bronchial wall thickening with very mild
centrilobular and paraseptal emphysema; imaging findings suggestive
of underlying COPD.
4. There are mild calcifications of the aortic valve.
Echocardiographic correlation for evaluation of potential valvular
dysfunction may be warranted if clinically indicated.

Aortic Atherosclerosis ([ZS]-[ZS]) and Emphysema ([ZS]-[ZS]).

## 2020-11-20 NOTE — Patient Instructions (Signed)
Thank you for participating in the [facility]. It was our pleasure to meet you today. We will call you with the results of your scan within the next few days. Your scan will be assigned a Lung RADS category score by the physicians reading the scans.  This Lung RADS score determines follow up scanning.  See below for description of categories, and follow up screening recommendations. We will be in touch to schedule your follow up screening annually or based on recommendations of our providers. We will fax a copy of your scan results to your Primary Care Physician, or the physician who referred you to the program, to ensure they have the results. Please call the office if you have any questions or concerns regarding your scanning experience or results.  Our office number is [phone]. Please speak with Denise Phelps, RN. She is our Lung Cancer Screening RN. If she is unavailable when you call, please have the office staff send her a message. She will return your call at her earliest convenience. Remember, if your scan is normal, we will scan you annually as long as you continue to meet the criteria for the program. (Age 55-77, Current smoker or smoker who has quit within the last 15 years). If you are a smoker, remember, quitting is the single most powerful action that you can take to decrease your risk of lung cancer and other pulmonary, breathing related problems. We know quitting is hard, and we are here to help.  Please let us know if there is anything we can do to help you meet your goal of quitting. If you are a former smoker, congratulations. We are proud of you! Remain smoke free! Remember you can refer friends or family members through the number above.  We will screen them to make sure they meet criteria for the program. Thank you for helping us take better care of you by participating in Lung Screening.  Lung RADS Categories:  Lung RADS 1: no nodules  or definitely non-concerning nodules.  Recommendation is for a repeat annual scan in 12 months.  Lung RADS 2:  nodules that are non-concerning in appearance and behavior with a very low likelihood of becoming an active cancer. Recommendation is for a repeat annual scan in 12 months.  Lung RADS 3: nodules that are probably non-concerning , includes nodules with a low likelihood of becoming an active cancer.  Recommendation is for a 6-month repeat screening scan. Often noted after an upper respiratory illness. We will be in touch to make sure you have no questions, and to schedule your 6-month scan.  Lung RADS 4 A: nodules with concerning findings, recommendation is most often for a follow up scan in 3 months or additional testing based on our provider's assessment of the scan. We will be in touch to make sure you have no questions and to schedule the recommended 3 month follow up scan.  Lung RADS 4 B:  indicates findings that are concerning. We will be in touch with you to schedule additional diagnostic testing based on our provider's  assessment of the scan.   

## 2020-11-20 NOTE — Progress Notes (Signed)
Shared Decision Making Visit Lung Cancer Screening Program [phone])   Eligibility:  Age 70 y.o.  Pack Years Smoking History Calculation 56 pack year smoking history (# packs/per year x # years smoked)  Recent History of coughing up blood  no  Unexplained weight loss? no ( >Than 15 pounds within the last 6 months )  Prior History Lung / other cancer no (Diagnosis within the last 5 years already requiring surveillance chest CT Scans).  Smoking Status Former Smoker  Former Smokers: Years since quit: 1 year  Quit Date: 12/2018  Visit Components:  Discussion included one or more decision making aids. yes  Discussion included risk/benefits of screening. yes  Discussion included potential follow up diagnostic testing for abnormal scans. yes  Discussion included meaning and risk of over diagnosis. yes  Discussion included meaning and risk of False Positives. yes  Discussion included meaning of total radiation exposure. yes  Counseling Included:  Importance of adherence to annual lung cancer LDCT screening. yes  Impact of comorbidities on ability to participate in the program. yes  Ability and willingness to under diagnostic treatment. yes  Smoking Cessation Counseling:  Current Smokers:   Discussed importance of smoking cessation. yes  Information about tobacco cessation classes and interventions provided to patient. yes  Patient provided with "ticket" for LDCT Scan. yes  Symptomatic Patient. no  Counseling  Diagnosis Code: Tobacco Use Z72.0  Asymptomatic Patient yes  Counseling (Intermediate counseling: > three minutes counseling) R4854  Former Smokers:   Discussed the importance of maintaining cigarette abstinence. yes  Diagnosis Code: Personal History of Nicotine Dependence. O27.035  Information about tobacco cessation classes and interventions provided to patient. Yes  Patient provided with "ticket" for LDCT Scan. yes  Written Order for Lung Cancer  Screening with LDCT placed in Epic. Yes (CT Chest Lung Cancer Screening Low Dose W/O CM) [id number] Z12.2-Screening of respiratory organs Z87.891-Personal history of nicotine dependence  I spent 25 minutes of face to face time with Mr. Tsou discussing the risks and benefits of lung cancer screening. We viewed a power point together that explained in detail the above noted topics. We took the time to pause the power point at intervals to allow for questions to be asked and answered to ensure understanding. We discussed that he had taken the single most powerful action possible to decrease his risk of developing lung cancer when he quit smoking. I counseled him to remain smoke free, and to contact me if he ever had the desire to smoke again so that I can provide resources and tools to help support the effort to remain smoke free. We discussed the time and location of the scan, and that either  Doroteo Glassman RN or I will call with the results within  24-48 hours of receiving them. He has my card and contact information in the event he needs to speak with me, in addition to a copy of the power point we reviewed as a resource. He verbalized understanding of all of the above and had no further questions upon leaving the office.     I explained to the patient that there has been a high incidence of coronary artery disease noted on these exams. I explained that this is a non-gated exam therefore degree or severity cannot be determined. This patient is currently on statin therapy. I have asked the patient to follow-up with their PCP regarding any incidental finding of coronary artery disease and management with diet or medication as they feel is clinically  indicated. The patient verbalized understanding of the above and had no further questions.     Magdalen Spatz, NP 11/20/2020 2:58 PM

## 2020-11-28 DIAGNOSIS — E119 Type 2 diabetes mellitus without complications: Secondary | ICD-10-CM | POA: Diagnosis not present

## 2020-11-28 DIAGNOSIS — H40013 Open angle with borderline findings, low risk, bilateral: Secondary | ICD-10-CM | POA: Diagnosis not present

## 2020-11-28 LAB — HM DIABETES EYE EXAM

## 2020-11-28 NOTE — Progress Notes (Signed)
Please call patient and let them  know their  low dose Ct was read as a Lung RADS 2: nodules that are benign in appearance and behavior with a very low likelihood of becoming a clinically active cancer due to size or lack of growth. Recommendation per radiology is for a repeat LDCT in 12 months. .Please let them  know we will order and schedule their  annual screening scan for 10/2021 Please let them  know there was notation of CAD on their  scan.  Please remind the patient  that this is a non-gated exam therefore degree or severity of disease  cannot be determined. Please have them  follow up with their PCP regarding potential risk factor modification, dietary therapy or pharmacologic therapy if clinically indicated. Pt.  is  currently on statin therapy. Please place order for annual  screening scan for  10/2021 and fax results to PCP. Thanks so much.  Langley Gauss, this scan shows aortic atherosclerosis, in addition to left main and 3 vessel coronary artery disease on a non-gated exam. The patient is on statin therapy. Have him follow up with PCP for further evaluation. Thanks so much

## 2020-12-02 ENCOUNTER — Other Ambulatory Visit: Payer: Self-pay | Admitting: *Deleted

## 2020-12-02 DIAGNOSIS — Z87891 Personal history of nicotine dependence: Secondary | ICD-10-CM

## 2020-12-10 ENCOUNTER — Other Ambulatory Visit: Payer: Self-pay | Admitting: Internal Medicine

## 2020-12-17 ENCOUNTER — Telehealth: Payer: Self-pay | Admitting: Internal Medicine

## 2020-12-17 NOTE — Telephone Encounter (Signed)
Received a call from Vicente Males at [facility] regarding Pt's atorvastatin 40mg -she wanted to inform that we have sig incorrect that Pt has been taking 1/2 tablet daily for the last 15 years. She is requesting [place] to update our records. I informed her that I would be unable to just update the record- I would need verification from PCP- he may have increased it to 1 tablet daily for a reason. Informed Vicente Males I'd send PCP a message and follow-up w/ Pt directly.

## 2020-12-18 NOTE — Telephone Encounter (Signed)
Spoke w/ Pt- informed of recommendation. Pt verbalized understanding.

## 2020-12-18 NOTE — Telephone Encounter (Signed)
Atorvastatin 40 mg: 1 tablet daily (bedtime)

## 2021-01-02 ENCOUNTER — Telehealth: Payer: Self-pay | Admitting: Internal Medicine

## 2021-01-02 MED ORDER — AZELASTINE HCL 0.1 % NA SOLN: 2.0000 | Freq: Every day | NASAL | 6 refills | Status: DC

## 2021-01-02 NOTE — Telephone Encounter (Signed)
Patient states the  [facility] is going to contact us at the office in reference to nasal spray, per patient the pharmacy did not have enough medication refill his spray.

## 2021-01-02 NOTE — Telephone Encounter (Signed)
Rx sent 

## 2021-01-13 DIAGNOSIS — D225 Melanocytic nevi of trunk: Secondary | ICD-10-CM | POA: Diagnosis not present

## 2021-01-13 DIAGNOSIS — L820 Inflamed seborrheic keratosis: Secondary | ICD-10-CM | POA: Diagnosis not present

## 2021-01-13 DIAGNOSIS — D2261 Melanocytic nevi of right upper limb, including shoulder: Secondary | ICD-10-CM | POA: Diagnosis not present

## 2021-01-13 DIAGNOSIS — L723 Sebaceous cyst: Secondary | ICD-10-CM | POA: Diagnosis not present

## 2021-01-13 DIAGNOSIS — D2271 Melanocytic nevi of right lower limb, including hip: Secondary | ICD-10-CM | POA: Diagnosis not present

## 2021-01-17 ENCOUNTER — Ambulatory Visit: Payer: Medicare Other

## 2021-01-31 ENCOUNTER — Encounter: Payer: Self-pay | Admitting: Internal Medicine

## 2021-02-12 NOTE — Progress Notes (Signed)
Subjective:   [name] [name] is a 70 y.o. male who presents for Medicare Annual/Subsequent preventive examination.  Review of Systems     Cardiac Risk Factors include: advanced age (>57mn, >>63women);male gender;diabetes mellitus;dyslipidemia;obesity (BMI >30kg/m2)     Objective:    Today's Vitals   02/13/21 1510  BP: (!) 144/82  Pulse: 63  Resp: 16  Temp: (!) 97.4 F (36.3 C)  TempSrc: Temporal  SpO2: 98%  Weight: 271 lb 12.8 oz (123.3 kg)  Height: 6' 4" (1.93 m)   Body mass index is 33.08 kg/m.  Advanced Directives 02/13/2021 02/05/2020 02/02/2019 01/31/2018 01/28/2017  Does Patient Have a Medical Advance Directive? _0   Would patient like information on creating a medical advance directive? No - Patient declined No - Patient declined No - Patient declined No - Patient declined Yes (MAU/Ambulatory/Procedural Areas - Information given)    Current Medications (verified) Outpatient Encounter Medications as of 02/13/2021  Medication Sig  . aspirin 81 MG tablet Take 81 mg by mouth daily.  .Marland Kitchenatorvastatin (LIPITOR) 40 MG tablet Take 1 tablet (40 mg total) by mouth daily.  .Marland Kitchenazelastine (ASTELIN) 0.1 % nasal spray Place 2 sprays into both nostrils at bedtime.  . Blood Glucose Monitoring Suppl (ACCU-CHEK AVIVA PLUS) w/Device KIT Use to check blood sugar  . fish oil-omega-3 fatty acids 1000 MG capsule Take 2 g by mouth daily.  . fluticasone (FLONASE) 50 MCG/ACT nasal spray Place into both nostrils daily.  .Marland Kitchenglucose blood (ACCU-CHEK AVIVA) test strip Check blood sugar no more than twice daily  . Lancets (ACCU-CHEK SOFT TOUCH) lancets Check blood sugars no more than twice daily  . levothyroxine (SYNTHROID) 100 MCG tablet TAKE 1 TABLET BY MOUTH  DAILY BEFORE BREAKFAST  . lisinopril (ZESTRIL) 2.5 MG tablet Take 1 tablet (2.5 mg total) by mouth daily.  .Marland Kitchenloratadine (CLARITIN) 10 MG tablet Take 10 mg by mouth daily.  . Melatonin 1 MG TABS Take 1 mg by mouth daily.  . metFORMIN  (GLUCOPHAGE-XR) 500 MG 24 hr tablet Take 2 tablets (1,000 mg total) by mouth 2 (two) times daily.  . Multiple Vitamin (MULTIVITAMIN) tablet Take 1 tablet by mouth daily.  . mupirocin cream (BACTROBAN) 2 % Apply 1 application topically as needed. Reported on 01/27/2016  . sildenafil (REVATIO) 20 MG tablet TAKE 2 TO 3 TABLETS BY MOUTH ONCE DAILY AS NEEDED  . sitaGLIPtin (JANUVIA) 100 MG tablet Take 1 tablet (100 mg total) by mouth daily.  .Marland Kitchentriamcinolone cream (KENALOG) 0.1 %   . [DISCONTINUED] IBUPROFEN PO Take by mouth as needed. Reported on 01/27/2016   No facility-administered encounter medications on file as of 02/13/2021.    Allergies (verified) Patient has no known allergies.   History: Past Medical History:  Diagnosis Date  . 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 Colon polyps    Colon polyps at age 70 next Cscope age 70 . Diabetes mellitus   . DJD (degenerative joint disease)   . Glaucoma suspect   . Hyperlipidemia    Past Surgical History:  Procedure Laterality Date  . [facility]  . BUNIONECTOMY Left    2019  . NASAL SEPTUM SURGERY  2000  . SHOULDER ARTHROSCOPY     L 2011, R 2012   Family History  Problem Relation Age of Onset  . Arthritis Mother   . Alzheimer's disease Mother   . Lung cancer Father   . Stroke Other   .  Diabetes Other        uncle, brother (borderline)  . GER disease Sister   . Colon cancer Neg Hx   . Prostate cancer Neg Hx   . CAD Neg Hx    Social History   Socioeconomic History  . Marital status: Married    Spouse name: Not on file  . Number of children: 2  . Years of education: Not on file  . Highest education level: Not on file  Occupational History  . Occupation: disability    Employer: [employer]  Tobacco Use  . Smoking status: Former Research scientist (life sciences)  . Smokeless tobacco: Never Used  . Tobacco comment: quit on-off, no smoking since 12/2018  Substance and Sexual Activity  . Alcohol use: Yes     Alcohol/week: 2.0 standard drinks    Types: 2 Cans of beer per week    Comment: socially   . Drug use: No  . Sexual activity: Yes  Other Topics Concern  . Not on file  Social History Narrative   Household-- pt and wife    Luz Lex a lot    Social Determinants of Health   Financial Resource Strain: Low Risk   . Difficulty of Paying Living Expenses: Not hard at all  Food Insecurity: No Food Insecurity  . Worried About Charity fundraiser in the Last Year: Never true  . Ran Out of Food in the Last Year: Never true  Transportation Needs: No Transportation Needs  . Lack of Transportation (Medical): No  . Lack of Transportation (Non-Medical): No  Physical Activity: Insufficiently Active  . Days of Exercise per Week: 3 days  . Minutes of Exercise per Session: 30 min  Stress: No Stress Concern Present  . Feeling of Stress : Not at all  Social Connections: Moderately Isolated  . Frequency of Communication with Friends and Family: More than three times a week  . Frequency of Social Gatherings with Friends and Family: More than three times a week  . Attends Religious Services: Never  . Active Member of Clubs or Organizations: No  . Attends Archivist Meetings: Never  . Marital Status: Married    Tobacco Counseling Counseling given: Not Answered Comment: quit on-off, no smoking since 12/2018   Clinical Intake:  Pre-visit preparation completed: Yes  Pain : No/denies pain     Nutritional Status: BMI > 30  Obese Nutritional Risks: None Diabetes: Yes CBG done?: No Did pt. bring in CBG monitor from home?: No  How often do you need to have someone help you when you read instructions, pamphlets, or other written materials from your doctor or pharmacy?: 1 - Never  Diabetes:  Is the patient diabetic?  Yes  If diabetic, was a CBG obtained today?  No  Did the patient bring in their glucometer from home?  No  How often do you monitor your CBG's? daily.   Financial  Strains and Diabetes Management:  Are you having any financial strains with the device, your supplies or your medication? No .  Does the patient want to be seen by Chronic Care Management for management of their diabetes?  No  Would the patient like to be referred to a Nutritionist or for Diabetic Management?  No   Diabetic Exams:  Diabetic Eye Exam: Completed 11/28/2020.   Diabetic Foot Exam: Completed 08/21/2020.  Interpreter Needed?: No  Information entered by :: Caroleen Hamman LPN   Activities of Daily Living In your present state of health, do you have any difficulty  performing the following activities: 02/13/2021 08/21/2020  Hearing? N N  Vision? N N  Difficulty concentrating or making decisions? N N  Walking or climbing stairs? N N  Dressing or bathing? N N  Doing errands, shopping? N N  Preparing Food and eating ? N -  Using the Toilet? N -  In the past six months, have you accidently leaked urine? N -  Do you have problems with loss of bowel control? N -  Managing your Medications? N -  Managing your Finances? N -  Housekeeping or managing your Housekeeping? N -  Some recent data might be hidden    Patient Care Team: Colon Branch, MD as PCP - General (Internal Medicine) Idolina Primer Warnell Bureau (Optometry) Jarome Matin, MD as Consulting Physician (Dermatology) Wilford Corner, MD as Consulting Physician (Gastroenterology)  Indicate any recent Medical Services you may have received from other than [facility] providers in the past year (date may be approximate).     Assessment:   This is a routine wellness examination for Jayvyn.  Hearing/Vision screen  Hearing Screening   '125Hz'$  $Remo'250Hz'gDePY$'500Hz'$'1000Hz'$'2000Hz'$'3000Hz'$'4000Hz'$'6000Hz'$'8000Hz'$   Right ear:           Left ear:           Comments: No issues  Vision Screening Comments: Wears glasses Last eye exam-11/2020-Dr. Dunn  Dietary issues and exercise activities discussed: Current Exercise Habits: Home exercise routine, Type of  exercise: Other - see comments (golf & yardwork), Time (Minutes): 30, Frequency (Times/Week): 3, Weekly Exercise (Minutes/Week): 90, Intensity: Mild, Exercise limited by: None identified  Goals      Patient Stated   .  [place] (pt-stated)      Other   .  Increase physical activity      Depression Screen PHQ 2/9 Scores 02/13/2021 08/21/2020 02/05/2020 08/29/2019 02/02/2019 01/31/2018 01/28/2017  PHQ - 2 Score 0 0 0 0 0 0 0    Fall Risk Fall Risk  02/13/2021 08/21/2020 02/05/2020 08/29/2019 02/02/2019  Falls in the past year? - 0 0 0 0  Number falls in past yr: 0 0 0 - -  Injury with Fall? 0 0 0 - -  Follow up Falls prevention discussed Falls evaluation completed Education provided;Falls prevention discussed Falls evaluation completed -    FALL RISK PREVENTION PERTAINING TO THE HOME:  Any stairs in or around the home? Yes  If so, are there any without handrails? No  Home free of loose throw rugs in walkways, pet beds, electrical cords, etc? Yes  Adequate lighting in your home to reduce risk of falls? Yes   ASSISTIVE DEVICES UTILIZED TO PREVENT FALLS:  Life alert? No  Use of a cane, walker or w/c? No  Grab bars in the bathroom? No  Shower chair or bench in shower? No  Elevated toilet seat or a handicapped toilet? No   TIMED UP AND GO:  Was the test performed? Yes .  Length of time to ambulate 10 feet: 9 sec.   Gait steady and fast without use of assistive device  Cognitive Function:Normal cognitive status assessed by direct observation by this Nurse Health Advisor. No abnormalities found.   MMSE - Mini Mental State Exam 01/31/2018 01/28/2017  Orientation to time 5 5  Orientation to Place 5 5  Registration 3 3  Attention/ Calculation 5 5  Recall 3 2  Language- name 2 objects 2 2  Language- repeat 1 1  Language- follow 3 step command 3  3  Language- read & follow direction 1 1  Write a sentence 1 1  Copy design 1 1  Total score 30 29         Immunizations Immunization History  Administered Date(s) Administered  . Fluad Quad(high Dose 65+) 07/05/2019  . Influenza Split 08/04/2012, 07/27/2014  . Influenza, High Dose Seasonal PF 08/04/2017, 08/08/2018  . Influenza,inj,Quad PF,6+ Mos 08/15/2013, 08/07/2015, 07/28/2016  . Influenza-Unspecified 07/22/2020  . PFIZER(Purple Top)SARS-COV-2 Vaccination 11/21/2019, 12/08/2019, 07/27/2020  . Pneumococcal Conjugate-13 04/30/2014  . Pneumococcal Polysaccharide-23 10/30/2013, 08/29/2019  . Tdap 08/04/2012  . Zoster 12/05/2012  . Zoster Recombinat (Shingrix) 11/12/2020    TDAP status: Up to date  Flu Vaccine status: Up to date  Pneumococcal vaccine status: Up to date  Covid-19 vaccine status: Completed vaccines  Qualifies for Shingles Vaccine? Yes   Zostavax completed Yes   Shingrix Completed?: No.    Education has been provided regarding the importance of this vaccine. Patient has been advised to call insurance company to determine out of pocket expense if they have not yet received this vaccine. Advised may also receive vaccine at local pharmacy or Health Dept. Verbalized acceptance and understanding.  Screening Tests Health Maintenance  Topic Date Due  . HEMOGLOBIN A1C  02/19/2021  . INFLUENZA VACCINE  05/26/2021  . FOOT EXAM  08/21/2021  . OPHTHALMOLOGY EXAM  11/28/2021  . TETANUS/TDAP  08/04/2022  . COLONOSCOPY (Pts 45-57yr Insurance coverage will need to be confirmed)  01/31/2025  . COVID-19 Vaccine  Completed  . Hepatitis C Screening  Completed  . PNA vac Low Risk Adult  Completed  . HPV VACCINES  Aged Out    Health Maintenance  There are no preventive care reminders to display for this patient.  Colorectal cancer screening: Type of screening: Colonoscopy. Completed 02/01/2020. Repeat every 5 years  Lung Cancer Screening: (Low Dose CT Chest recommended if Age 69-80years, 30 pack-year currently smoking OR have quit w/in 15years.) does not qualify.     Additional Screening:  Hepatitis C Screening:  Completed 08/07/2015  Vision Screening: Recommended annual ophthalmology exams for early detection of glaucoma and other disorders of the eye. Is the patient up to date with their annual eye exam?  Yes  Who is the provider or what is the name of the office in which the patient attends annual eye exams? Dr. DIdolina Primer  Dental Screening: Recommended annual dental exams for proper oral hygiene  Community Resource Referral / Chronic Care Management: CRR required this visit?  No   CCM required this visit?  No      Plan:     I have personally reviewed and noted the following in the patient's chart:   . Medical and social history . Use of alcohol, tobacco or illicit drugs  . Current medications and supplements . Functional ability and status . Nutritional status . Physical activity . Advanced directives . List of other physicians . Hospitalizations, surgeries, and ER visits in previous 12 months . Vitals . Screenings to include cognitive, depression, and falls . Referrals and appointments  In addition, I have reviewed and discussed with patient certain preventive protocols, quality metrics, and best practice recommendations. A written personalized care plan for preventive services as well as general preventive health recommendations were provided to patient.   Patient to access avs on mychart.  MMarta Antu LPN   [id number] Nurse Health Advisor  Nurse Notes: none

## 2021-02-13 ENCOUNTER — Other Ambulatory Visit: Payer: Self-pay

## 2021-02-13 ENCOUNTER — Ambulatory Visit (INDEPENDENT_AMBULATORY_CARE_PROVIDER_SITE_OTHER): Payer: Medicare Other

## 2021-02-13 VITALS — BP 144/82 | HR 63 | Temp 97.4°F | Resp 16 | Ht 76.0 in | Wt 271.8 lb

## 2021-02-13 DIAGNOSIS — Z0000 Encounter for general adult medical examination without abnormal findings: Secondary | ICD-10-CM | POA: Diagnosis not present

## 2021-02-13 NOTE — Patient Instructions (Signed)
[name] [name] , Thank you for taking time to come for your Medicare Wellness Visit. I appreciate your ongoing commitment to your health goals. Please review the following plan we discussed and let me know if I can assist you in the future.   Screening recommendations/referrals: Colonoscopy: Completed 02/01/2020-Due 01/31/2025 Recommended yearly ophthalmology/optometry visit for glaucoma screening and checkup Recommended yearly dental visit for hygiene and checkup  Vaccinations: Influenza vaccine: Up to date Pneumococcal vaccine: Completed vaccines Tdap vaccine: Up to date-Due-08/04/2022 Shingles vaccine: Completed first dose  Covid-19: Completed vaccines  Advanced directives: [name] [name] bring a copy for your chart when available.  Conditions/risks identified: See problem list  Next appointment: Follow up in one year for your annual wellness visit. 02/19/2022 @ 3:20  Preventive Care 69 Years and Older, Male Preventive care refers to lifestyle choices and visits with your health care provider that can promote health and wellness. What does preventive care include?  A yearly physical exam. This is also called an annual well check.  Dental exams once or twice a year.  Routine eye exams. Ask your health care provider how often you should have your eyes checked.  Personal lifestyle choices, including:  Daily care of your teeth and gums.  Regular physical activity.  Eating a healthy diet.  Avoiding tobacco and drug use.  Limiting alcohol use.  Practicing safe sex.  Taking low doses of aspirin every day.  Taking vitamin and mineral supplements as recommended by your health care provider. What happens during an annual well check? The services and screenings done by your health care provider during your annual well check will depend on your age, overall health, lifestyle risk factors, and family history of disease. Counseling  Your health care provider may ask you questions about  your:  Alcohol use.  Tobacco use.  Drug use.  Emotional well-being.  Home and relationship well-being.  Sexual activity.  Eating habits.  History of falls.  Memory and ability to understand (cognition).  Work and work Statistician. Screening  You may have the following tests or measurements:  Height, weight, and BMI.  Blood pressure.  Lipid and cholesterol levels. These may be checked every 5 years, or more frequently if you are over 24 years old.  Skin check.  Lung cancer screening. You may have this screening every year starting at age 69 if you have a 30-pack-year history of smoking and currently smoke or have quit within the past 15 years.  Fecal occult blood test (FOBT) of the stool. You may have this test every year starting at age 69.  Flexible sigmoidoscopy or colonoscopy. You may have a sigmoidoscopy every 5 years or a colonoscopy every 10 years starting at age 69.  Prostate cancer screening. Recommendations will vary depending on your family history and other risks.  Hepatitis C blood test.  Hepatitis B blood test.  Sexually transmitted disease (STD) testing.  Diabetes screening. This is done by checking your blood sugar (glucose) after you have not eaten for a while (fasting). You may have this done every 1-3 years.  Abdominal aortic aneurysm (AAA) screening. You may need this if you are a current or former smoker.  Osteoporosis. You may be screened starting at age 42 if you are at high risk. Talk with your health care provider about your test results, treatment options, and if necessary, the need for more tests. Vaccines  Your health care provider may recommend certain vaccines, such as:  Influenza vaccine. This is recommended every year.  Tetanus, diphtheria,  and acellular pertussis (Tdap, Td) vaccine. You may need a Td booster every 10 years.  Zoster vaccine. You may need this after age 69.  Pneumococcal 13-valent conjugate (PCV13) vaccine.  One dose is recommended after age 69.  Pneumococcal polysaccharide (PPSV23) vaccine. One dose is recommended after age 69. Talk to your health care provider about which screenings and vaccines you need and how often you need them. This information is not intended to replace advice given to you by your health care provider. Make sure you discuss any questions you have with your health care provider. Document Released: 11/08/2015 Document Revised: 07/01/2016 Document Reviewed: 08/13/2015 Elsevier Interactive Patient Education  2017 [name] [name] Prevention in the Home Falls can cause injuries. They can happen to people of all ages. There are many things you can do to make your home safe and to help prevent falls. What can I do on the outside of my home?  Regularly fix the edges of walkways and driveways and fix any cracks.  Remove anything that might make you trip as you walk through a door, such as a raised step or threshold.  Trim any bushes or trees on the path to your home.  Use bright outdoor lighting.  Clear any walking paths of anything that might make someone trip, such as rocks or tools.  Regularly check to see if handrails are loose or broken. Make sure that both sides of any steps have handrails.  Any raised decks and porches should have guardrails on the edges.  Have any leaves, snow, or ice cleared regularly.  Use sand or salt on walking paths during winter.  Clean up any spills in your garage right away. This includes oil or grease spills. What can I do in the bathroom?  Use night lights.  Install grab bars by the toilet and in the tub and shower. Do not use towel bars as grab bars.  Use non-skid mats or decals in the tub or shower.  If you need to sit down in the shower, use a plastic, non-slip stool.  Keep the floor dry. Clean up any water that spills on the floor as soon as it happens.  Remove soap buildup in the tub or shower regularly.  Attach bath  mats securely with double-sided non-slip rug tape.  Do not have throw rugs and other things on the floor that can make you trip. What can I do in the bedroom?  Use night lights.  Make sure that you have a light by your bed that is easy to reach.  Do not use any sheets or blankets that are too big for your bed. They should not hang down onto the floor.  Have a firm chair that has side arms. You can use this for support while you get dressed.  Do not have throw rugs and other things on the floor that can make you trip. What can I do in the kitchen?  Clean up any spills right away.  Avoid walking on wet floors.  Keep items that you use a lot in easy-to-reach places.  If you need to reach something above you, use a strong step stool that has a grab bar.  Keep electrical cords out of the way.  Do not use floor polish or wax that makes floors slippery. If you must use wax, use non-skid floor wax.  Do not have throw rugs and other things on the floor that can make you trip. What can I do with my  stairs?  Do not leave any items on the stairs.  Make sure that there are handrails on both sides of the stairs and use them. Fix handrails that are broken or loose. Make sure that handrails are as long as the stairways.  Check any carpeting to make sure that it is firmly attached to the stairs. Fix any carpet that is loose or worn.  Avoid having throw rugs at the top or bottom of the stairs. If you do have throw rugs, attach them to the floor with carpet tape.  Make sure that you have a light switch at the top of the stairs and the bottom of the stairs. If you do not have them, ask someone to add them for you. What else can I do to help prevent falls?  Wear shoes that:  Do not have high heels.  Have rubber bottoms.  Are comfortable and fit you well.  Are closed at the toe. Do not wear sandals.  If you use a stepladder:  Make sure that it is fully opened. Do not climb a closed  stepladder.  Make sure that both sides of the stepladder are locked into place.  Ask someone to hold it for you, if possible.  Clearly mark and make sure that you can see:  Any grab bars or handrails.  First and last steps.  Where the edge of each step is.  Use tools that help you move around (mobility aids) if they are needed. These include:  Canes.  Walkers.  Scooters.  Crutches.  Turn on the lights when you go into a dark area. Replace any light bulbs as soon as they burn out.  Set up your furniture so you have a clear path. Avoid moving your furniture around.  If any of your floors are uneven, fix them.  If there are any pets around you, be aware of where they are.  Review your medicines with your doctor. Some medicines can make you feel dizzy. This can increase your chance of falling. Ask your doctor what other things that you can do to help prevent falls. This information is not intended to replace advice given to you by your health care provider. Make sure you discuss any questions you have with your health care provider. Document Released: 08/08/2009 Document Revised: 03/19/2016 Document Reviewed: 11/16/2014 Elsevier Interactive Patient Education  2017 [employer].

## 2021-02-17 ENCOUNTER — Encounter: Payer: Self-pay | Admitting: Internal Medicine

## 2021-02-17 ENCOUNTER — Ambulatory Visit (INDEPENDENT_AMBULATORY_CARE_PROVIDER_SITE_OTHER): Payer: Medicare Other | Admitting: Internal Medicine

## 2021-02-17 ENCOUNTER — Other Ambulatory Visit: Payer: Self-pay

## 2021-02-17 VITALS — BP 166/72 | HR 54 | Temp 97.0°F | Ht 76.0 in | Wt 273.0 lb

## 2021-02-17 DIAGNOSIS — E785 Hyperlipidemia, unspecified: Secondary | ICD-10-CM

## 2021-02-17 DIAGNOSIS — E039 Hypothyroidism, unspecified: Secondary | ICD-10-CM

## 2021-02-17 DIAGNOSIS — R030 Elevated blood-pressure reading, without diagnosis of hypertension: Secondary | ICD-10-CM

## 2021-02-17 DIAGNOSIS — E119 Type 2 diabetes mellitus without complications: Secondary | ICD-10-CM

## 2021-02-17 DIAGNOSIS — J449 Chronic obstructive pulmonary disease, unspecified: Secondary | ICD-10-CM

## 2021-02-17 DIAGNOSIS — I2510 Atherosclerotic heart disease of native coronary artery without angina pectoris: Secondary | ICD-10-CM | POA: Diagnosis not present

## 2021-02-17 LAB — BASIC METABOLIC PANEL
BUN: 20 mg/dL (ref 6–23)
CO2: 28 mEq/L (ref 19–32)
Calcium: 9.6 mg/dL (ref 8.4–10.5)
Chloride: 102 mEq/L (ref 96–112)
Creatinine, Ser: 1.43 mg/dL (ref 0.40–1.50)
GFR: 50.32 mL/min — ABNORMAL LOW (ref >60.00)
Glucose, Bld: 110 mg/dL — ABNORMAL HIGH (ref 70–99)
Potassium: 4.8 mEq/L (ref 3.5–5.1)
Sodium: 136 mEq/L (ref 135–145)

## 2021-02-17 LAB — LIPID PANEL
Cholesterol: 126 mg/dL (ref 0–200)
HDL: 46.8 mg/dL (ref >39.00)
LDL Cholesterol: 66 mg/dL (ref 0–99)
NonHDL: 79.61
Total CHOL/HDL Ratio: 3
Triglycerides: 68 mg/dL (ref 0.0–149.0)
VLDL: 13.6 mg/dL (ref 0.0–40.0)

## 2021-02-17 LAB — TSH: TSH: 0.28 u[IU]/mL — ABNORMAL LOW (ref 0.35–4.50)

## 2021-02-17 LAB — HEMOGLOBIN A1C: Hgb A1c MFr Bld: 6.8 % — ABNORMAL HIGH (ref 4.6–6.5)

## 2021-02-17 MED ORDER — GLUCOSE BLOOD VI STRP: ORAL_STRIP | 12 refills | Status: DC

## 2021-02-17 MED ORDER — ACCU-CHEK SOFTCLIX LANCETS MISC: 12 refills | Status: DC

## 2021-02-17 NOTE — Patient Instructions (Addendum)
Check the  blood pressure every week  BP GOAL is between 110/65 and  135/85. If it is consistently higher or lower, let me know    GO TO THE LAB : Get the blood work     [name], [name] back for a checkup in 3 months    [email or web address]">  DASH Eating Plan DASH stands for Dietary Approaches to Stop Hypertension. The DASH eating plan is a healthy eating plan that has been shown to:  Reduce high blood pressure (hypertension).  Reduce your risk for type 2 diabetes, heart disease, and stroke.  Help with weight loss. What are tips for following this plan? Reading food labels  Check food labels for the amount of salt (sodium) per serving. Choose foods with less than 5 percent of the Daily Value of sodium. Generally, foods with less than 300 milligrams (mg) of sodium per serving fit into this eating plan.  To find whole grains, look for the word "whole" as the first word in the ingredient list. Shopping  Buy products labeled as "low-sodium" or "no salt added."  Buy fresh foods. Avoid canned foods and pre-made or frozen meals. Cooking  Avoid adding salt when cooking. Use salt-free seasonings or herbs instead of table salt or sea salt. Check with your health care provider or pharmacist before using salt substitutes.  Do not fry foods. Cook foods using healthy methods such as baking, boiling, grilling, roasting, and broiling instead.  Cook with heart-healthy oils, such as olive, canola, avocado, soybean, or sunflower oil. Meal planning  Eat a balanced diet that includes: ? 4 or more servings of fruits and 4 or more servings of vegetables each day. Try to fill one-half of your plate with fruits and vegetables. ? 6-8 servings of whole grains each day. ? Less than 6 oz (170 g) of lean meat, poultry, or fish each day. A 3-oz (85-g) serving of meat is about the same size as a deck of cards. One  egg equals 1 oz (28 g). ? 2-3 servings of low-fat dairy each day. One serving is 1 cup (237 mL). ? 1 serving of nuts, seeds, or beans 5 times each week. ? 2-3 servings of heart-healthy fats. Healthy fats called omega-3 fatty acids are found in foods such as walnuts, flaxseeds, fortified milks, and eggs. These fats are also found in cold-water fish, such as sardines, salmon, and mackerel.  Limit how much you eat of: ? Canned or prepackaged foods. ? Food that is high in trans fat, such as some fried foods. ? Food that is high in saturated fat, such as fatty meat. ? Desserts and other sweets, sugary drinks, and other foods with added sugar. ? Full-fat dairy products.  Do not salt foods before eating.  Do not eat more than 4 egg yolks a week.  Try to eat at least 2 vegetarian meals a week.  Eat more home-cooked food and less restaurant, buffet, and fast food.   Lifestyle  When eating at a restaurant, ask that your food be prepared with less salt or no salt, if possible.  If you drink alcohol: ? Limit how much you use to:  0-1 drink a day for women who are not pregnant.  0-2 drinks a day for men. ? Be aware of how much alcohol is in your drink. In the [place] one drink equals one 12 oz bottle of beer (355 mL), one 5 oz glass of wine (148 mL), or one  1 oz glass of hard liquor (44 mL). General information  Avoid eating more than 2,300 mg of salt a day. If you have hypertension, you may need to reduce your sodium intake to 1,500 mg a day.  Work with your health care provider to maintain a healthy body weight or to lose weight. Ask what an ideal weight is for you.  Get at least 30 minutes of exercise that causes your heart to beat faster (aerobic exercise) most days of the week. Activities may include walking, swimming, or biking.  Work with your health care provider or dietitian to adjust your eating plan to your individual calorie needs. What foods should I eat? Fruits All fresh,  dried, or frozen fruit. Canned fruit in natural juice (without added sugar). Vegetables Fresh or frozen vegetables (raw, steamed, roasted, or grilled). Low-sodium or reduced-sodium tomato and vegetable juice. Low-sodium or reduced-sodium tomato sauce and tomato paste. Low-sodium or reduced-sodium canned vegetables. Grains Whole-grain or whole-wheat bread. Whole-grain or whole-wheat pasta. Brown rice. [place]. Bulgur. Whole-grain and low-sodium cereals. Pita bread. Low-fat, low-sodium crackers. Whole-wheat flour tortillas. Meats and other proteins Skinless chicken or [place]. Ground chicken or [place]. Pork with fat trimmed off. Fish and seafood. Egg whites. Dried beans, peas, or lentils. Unsalted nuts, nut butters, and seeds. Unsalted canned beans. Lean cuts of beef with fat trimmed off. Low-sodium, lean precooked or cured meat, such as sausages or meat loaves. Dairy Low-fat (1%) or fat-free (skim) milk. Reduced-fat, low-fat, or fat-free cheeses. Nonfat, low-sodium ricotta or cottage cheese. Low-fat or nonfat yogurt. Low-fat, low-sodium cheese. Fats and oils Soft margarine without trans fats. Vegetable oil. Reduced-fat, low-fat, or light mayonnaise and salad dressings (reduced-sodium). Canola, safflower, olive, avocado, soybean, and sunflower oils. Avocado. Seasonings and condiments Herbs. Spices. Seasoning mixes without salt. Other foods Unsalted popcorn and pretzels. Fat-free sweets. The items listed above may not be a complete list of foods and beverages you can eat. Contact a dietitian for more information. What foods should I avoid? Fruits Canned fruit in a light or heavy syrup. Fried fruit. Fruit in cream or butter sauce. Vegetables Creamed or fried vegetables. Vegetables in a cheese sauce. Regular canned vegetables (not low-sodium or reduced-sodium). Regular canned tomato sauce and paste (not low-sodium or reduced-sodium). Regular tomato and vegetable juice (not low-sodium or  reduced-sodium). [name] [name]. Olives. Grains Baked goods made with fat, such as croissants, muffins, or some breads. Dry pasta or rice meal packs. Meats and other proteins Fatty cuts of meat. Ribs. Fried meat. [name] [name]. Bologna, salami, and other precooked or cured meats, such as sausages or meat loaves. Fat from the back of a pig (fatback). Bratwurst. Salted nuts and seeds. Canned beans with added salt. Canned or smoked fish. Whole eggs or egg yolks. Chicken or [place] with skin. Dairy Whole or 2% milk, cream, and half-and-half. Whole or full-fat cream cheese. Whole-fat or sweetened yogurt. Full-fat cheese. Nondairy creamers. Whipped toppings. Processed cheese and cheese spreads. Fats and oils Butter. Stick margarine. Lard. Shortening. Ghee. Bacon fat. Tropical oils, such as coconut, palm kernel, or palm oil. Seasonings and condiments Onion salt, garlic salt, seasoned salt, table salt, and sea salt. Worcestershire sauce. Tartar sauce. Barbecue sauce. Teriyaki sauce. Soy sauce, including reduced-sodium. Steak sauce. Canned and packaged gravies. Fish sauce. Oyster sauce. Cocktail sauce. Store-bought horseradish. Ketchup. Mustard. Meat flavorings and tenderizers. Bouillon cubes. Hot sauces. Pre-made or packaged marinades. Pre-made or packaged taco seasonings. Relishes. Regular salad dressings. Other foods Salted popcorn and pretzels. The items listed above may not be  a complete list of foods and beverages you should avoid. Contact a dietitian for more information. Where to find more information  National Heart, Lung, and Blood Institute: https://wilson-eaton.com/  American Heart Association: www.heart.org  Academy of Nutrition and Dietetics: www.eatright.[place]: www.kidney.org Summary  The DASH eating plan is a healthy eating plan that has been shown to reduce high blood pressure (hypertension). It may also reduce your risk for type 2 diabetes, heart disease, and stroke.  When on  the DASH eating plan, aim to eat more fresh fruits and vegetables, whole grains, lean proteins, low-fat dairy, and heart-healthy fats.  With the DASH eating plan, you should limit salt (sodium) intake to 2,300 mg a day. If you have hypertension, you may need to reduce your sodium intake to 1,500 mg a day.  Work with your health care provider or dietitian to adjust your eating plan to your individual calorie needs. This information is not intended to replace advice given to you by your health care provider. Make sure you discuss any questions you have with your health care provider. Document Revised: 09/15/2019 Document Reviewed: 09/15/2019 Elsevier Patient Education  2021 [employer].

## 2021-02-17 NOTE — Progress Notes (Signed)
Subjective:    Patient ID: [name] [name], male    DOB: Sep 05, 1951, 70 y.o.   MRN: [id number]  DOS:  02/17/2021 Type of visit - description:  ROV  Since the last office visit, had a CT chest for lung cancer screening, results reviewed with the patient.  For the last 4 months complains of mucus pulling on his throat, postnasal dripping, "constantly clearing my throat". Denies cough, sputum production or wheezing. No sneezing or watery eyes. Already taking allergy medications. Not consistently hoarse.  Denies chest pain, shortness of breath or edema. She still has yard work regularly, he did notice his stamina is not as good as few years ago.  Review of Systems See above   Past Medical History:  Diagnosis Date  . 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 Colon polyps    Colon polyps at age 70, next Cscope age 36  . Diabetes mellitus   . DJD (degenerative joint disease)   . Glaucoma suspect   . Hyperlipidemia     Past Surgical History:  Procedure Laterality Date  . BUNIONECTOMY Right 12/27/2014   [facility]  . BUNIONECTOMY Left    2019  . NASAL SEPTUM SURGERY  2000  . SHOULDER ARTHROSCOPY     L 2011, R 2012    Allergies as of 02/17/2021   No Known Allergies     Medication List       Accurate as of February 17, 2021 11:59 PM. If you have any questions, ask your nurse or doctor.        Accu-Chek Aviva Plus w/Device Kit Use to check blood sugar   Accu-Chek Softclix Lancets lancets Use as instructed What changed: additional instructions Changed by: Kathlene November, MD   aspirin 81 MG tablet Take 81 mg by mouth daily.   atorvastatin 40 MG tablet Commonly known as: LIPITOR Take 1 tablet (40 mg total) by mouth daily.   azelastine 0.1 % nasal spray Commonly known as: ASTELIN Place 2 sprays into both nostrils at bedtime.   fish oil-omega-3 fatty acids 1000 MG capsule Take 2 g by mouth daily.   fluticasone 50 MCG/ACT nasal spray Commonly known as: FLONASE Place into  both nostrils daily.   glucose blood test strip Commonly known as: Accu-Chek Aviva Check blood sugar no more than twice daily   levothyroxine 100 MCG tablet Commonly known as: SYNTHROID TAKE 1 TABLET BY MOUTH  DAILY BEFORE BREAKFAST   lisinopril 2.5 MG tablet Commonly known as: ZESTRIL Take 1 tablet (2.5 mg total) by mouth daily.   loratadine 10 MG tablet Commonly known as: CLARITIN Take 10 mg by mouth daily.   melatonin 1 MG Tabs tablet Take 1 mg by mouth daily.   metFORMIN 500 MG 24 hr tablet Commonly known as: GLUCOPHAGE-XR Take 2 tablets (1,000 mg total) by mouth 2 (two) times daily.   multivitamin tablet Take 1 tablet by mouth daily.   mupirocin cream 2 % Commonly known as: BACTROBAN Apply 1 application topically as needed. Reported on 01/27/2016   sildenafil 20 MG tablet Commonly known as: REVATIO TAKE 2 TO 3 TABLETS BY MOUTH ONCE DAILY AS NEEDED   sitaGLIPtin 100 MG tablet Commonly known as: Januvia Take 1 tablet (100 mg total) by mouth daily.   triamcinolone cream 0.1 % Commonly known as: KENALOG          Objective:   Physical Exam BP (!) 166/72 (BP Location: Left Arm, Patient Position: Sitting, Cuff Size: Normal)   Pulse (!) 54  Temp (!) 97 F (36.1 C) (Temporal)   Ht '6\' 4"'  (1.93 m)   Wt 273 lb (123.8 kg)   SpO2 97%   BMI 33.23 kg/m  General:   Well developed, NAD, BMI noted.  HEENT:  Normocephalic . Face symmetric, atraumatic.  Throat: Symmetric, no red.  No hoarseness Neck: No lymphadenopathies. Lungs:  CTA B Normal respiratory effort, no intercostal retractions, no accessory muscle use. Heart: RRR,  no murmur.  Abdomen:  Not distended, soft, non-tender. No rebound or rigidity.   Skin: Not pale. Not jaundice Lower extremities: no pretibial edema bilaterally  Neurologic:  alert & oriented X3.  Speech normal, gait appropriate for age and unassisted Psych--  Cognition and judgment appear intact.  Cooperative with normal attention span  and concentration.  Behavior appropriate. No anxious or depressed appearing.     Assessment     Assessment DM- on ACEi Hyperlipidemia Hypothyroidism DJD Pre-glaucoma E.D. Chronic R foot pain, s/p surgery w/ podiatry 2016  PLAN: DM: Currently on metformin, Januvia.  Check A1c. Hypothyroidism: On Synthroid, check TSH High cholesterol: On atorvastatin 40 mg (used to take half tablet only, on a full tablet for the last 2 months) check FLP Elevated BP: On ACE inhibitor's for DM, BP is typically okay except today, it is in the 160s.  No change, monitor BPs, check BMP.  Low-salt diet, see AVS. CAD, aortic calcification: Per CT, on statins, aspirin, no pain, no murmur on exam.  Will discuss with cardiology. COPD: Per CT, asymptomatic, former smoker.  Observation. Allergies: On Astelin, Flonase, Claritin,  still has some symptoms (constantly clearing the throat) declined a referral for now. RTC 3 months (mostly to check BPs)   this visit occurred during the SARS-CoV-2 public health emergency.  Safety protocols were in place, including screening questions prior to the visit, additional usage of staff PPE, and extensive cleaning of exam room while observing appropriate contact time as indicated for disinfecting solutions.

## 2021-02-18 DIAGNOSIS — J449 Chronic obstructive pulmonary disease, unspecified: Secondary | ICD-10-CM | POA: Insufficient documentation

## 2021-02-18 NOTE — Assessment & Plan Note (Signed)
DM: Currently on metformin, Januvia.  Check A1c. Hypothyroidism: On Synthroid, check TSH High cholesterol: On atorvastatin 40 mg (used to take half tablet only, on a full tablet for the last 2 months) check FLP Elevated BP: On ACE inhibitor's for DM, BP is typically okay except today, it is in the 160s.  No change, monitor BPs, check BMP.  Low-salt diet, see AVS. CAD, aortic calcification: Per CT, on statins, aspirin, no pain, no murmur on exam.  Will discuss with cardiology. COPD: Per CT, asymptomatic, former smoker.  Observation. Allergies: On Astelin, Flonase, Claritin,  still has some symptoms (constantly clearing the throat) declined a referral for now. RTC 3 months (mostly to check BPs)

## 2021-02-19 ENCOUNTER — Other Ambulatory Visit: Payer: Self-pay | Admitting: Internal Medicine

## 2021-02-19 ENCOUNTER — Encounter: Payer: Self-pay | Admitting: Internal Medicine

## 2021-02-19 MED ORDER — LEVOTHYROXINE SODIUM 88 MCG PO TABS: 88.0000 ug | ORAL_TABLET | Freq: Every day | ORAL | 1 refills | Status: DC

## 2021-02-19 NOTE — Addendum Note (Signed)
Addended by: Kathlene November E on: 02/19/2021 11:20 AM   Modules accepted: Orders

## 2021-02-19 NOTE — Addendum Note (Signed)
Addended byDamita Dunnings D on: 02/19/2021 11:20 AM   Modules accepted: Orders

## 2021-03-11 ENCOUNTER — Encounter: Payer: Self-pay | Admitting: Internal Medicine

## 2021-03-11 DIAGNOSIS — R1310 Dysphagia, unspecified: Secondary | ICD-10-CM

## 2021-03-18 ENCOUNTER — Other Ambulatory Visit: Payer: Self-pay | Admitting: Gastroenterology

## 2021-03-18 DIAGNOSIS — R1310 Dysphagia, unspecified: Secondary | ICD-10-CM

## 2021-03-20 ENCOUNTER — Ambulatory Visit (INDEPENDENT_AMBULATORY_CARE_PROVIDER_SITE_OTHER): Payer: Medicare Other

## 2021-03-20 ENCOUNTER — Other Ambulatory Visit: Payer: Self-pay

## 2021-03-20 DIAGNOSIS — I2510 Atherosclerotic heart disease of native coronary artery without angina pectoris: Secondary | ICD-10-CM | POA: Diagnosis not present

## 2021-03-20 LAB — EXERCISE TOLERANCE TEST
Estimated workload: 4 METS
Exercise duration (min): 1 min
Exercise duration (sec): 37 s
MPHR: 152 {beats}/min
Peak HR: 123 {beats}/min
Percent HR: 80 %
RPE: 15
Rest HR: 52 {beats}/min

## 2021-03-31 ENCOUNTER — Other Ambulatory Visit: Payer: Self-pay

## 2021-03-31 ENCOUNTER — Other Ambulatory Visit (INDEPENDENT_AMBULATORY_CARE_PROVIDER_SITE_OTHER): Payer: Medicare Other

## 2021-03-31 DIAGNOSIS — E039 Hypothyroidism, unspecified: Secondary | ICD-10-CM | POA: Diagnosis not present

## 2021-03-31 LAB — TSH: TSH: 2.47 u[IU]/mL (ref 0.35–4.50)

## 2021-03-31 MED ORDER — METOPROLOL TARTRATE 25 MG PO TABS: 25.0000 mg | ORAL_TABLET | Freq: Two times a day (BID) | ORAL | 3 refills | Status: DC

## 2021-04-02 ENCOUNTER — Encounter: Payer: Self-pay | Admitting: Internal Medicine

## 2021-04-02 ENCOUNTER — Ambulatory Visit
Admission: RE | Admit: 2021-04-02 | Discharge: 2021-04-02 | Disposition: A | Discharge status: Home / Self Care | Payer: Medicare Other | Source: Ambulatory Visit | Attending: Gastroenterology | Admitting: Gastroenterology

## 2021-04-02 ENCOUNTER — Other Ambulatory Visit: Payer: Self-pay | Admitting: Gastroenterology

## 2021-04-02 DIAGNOSIS — K2289 Other specified disease of esophagus: Secondary | ICD-10-CM | POA: Diagnosis not present

## 2021-04-02 DIAGNOSIS — R1310 Dysphagia, unspecified: Secondary | ICD-10-CM

## 2021-04-02 IMAGING — RF DG ESOPHAGUS
9 series · 14 of 24 positions shown · non-contrast
Comparison: Chest CT [DATE]

CLINICAL DATA: Dysphagia with sensation of food sticking in the
throat.

EXAM:
ESOPHOGRAM / BARIUM SWALLOW / BARIUM TABLET STUDY
TECHNIQUE: Combined double contrast and single contrast examination performed
using effervescent crystals, thick barium liquid, and thin barium
liquid. The patient was observed with fluoroscopy swallowing a 13 mm
barium sulphate tablet.
FLUOROSCOPY TIME:  Fluoroscopy Time: 1 minutes and 18 seconds of
low-dose pulsed fluoroscopy
Radiation Exposure Index (if provided by the fluoroscopic device):
11.2 mGy
Number of Acquired Spot Images: 0

[Series 1: sequence · 2 of 14 frames shown (1 of 6)]
[frame 3/14]
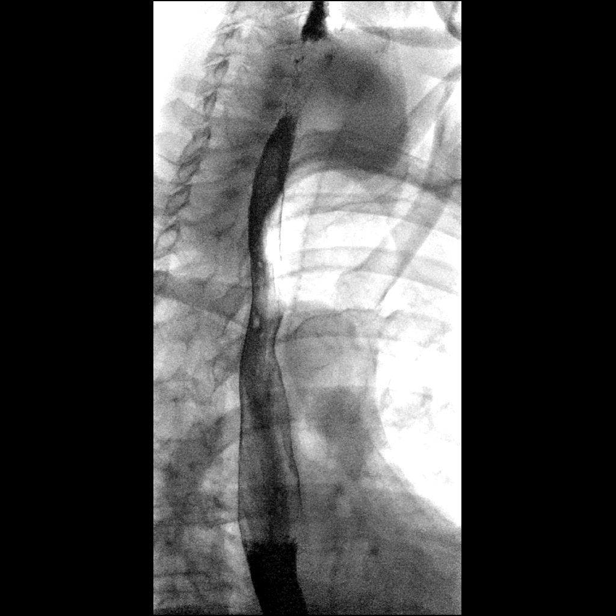
[frame 13/14]
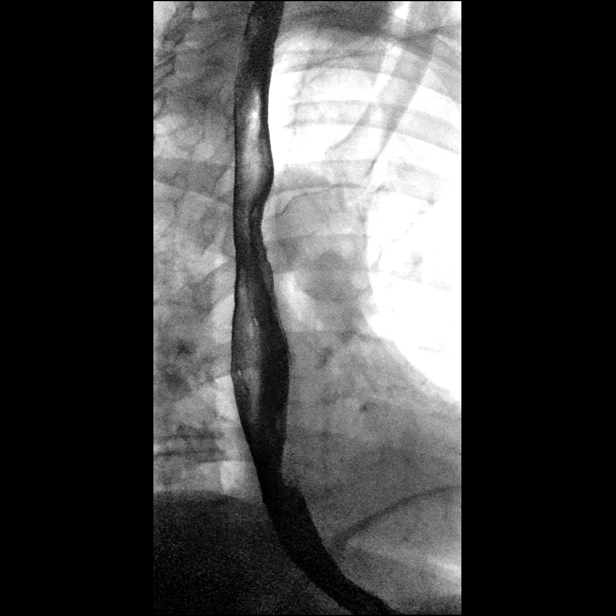

[Series 2: one shot · 1 of 2 slices shown (1 of 3)]
[im 2/2]
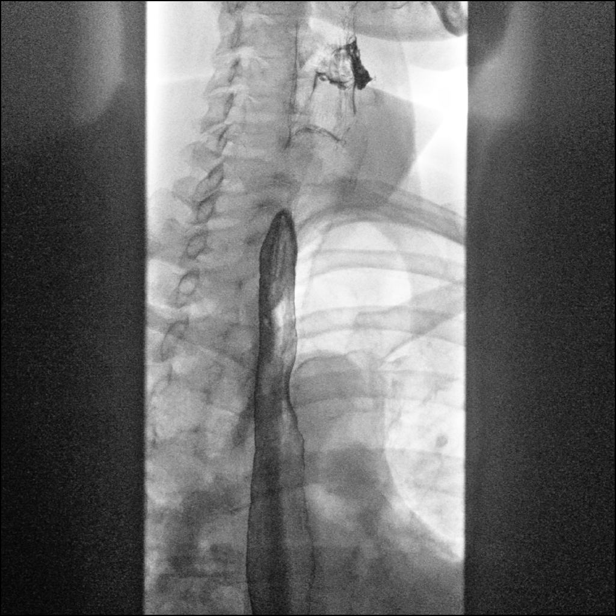

[Series 3: sequence · 2 of 16 frames shown (2 of 6)]
[frame 9/16]
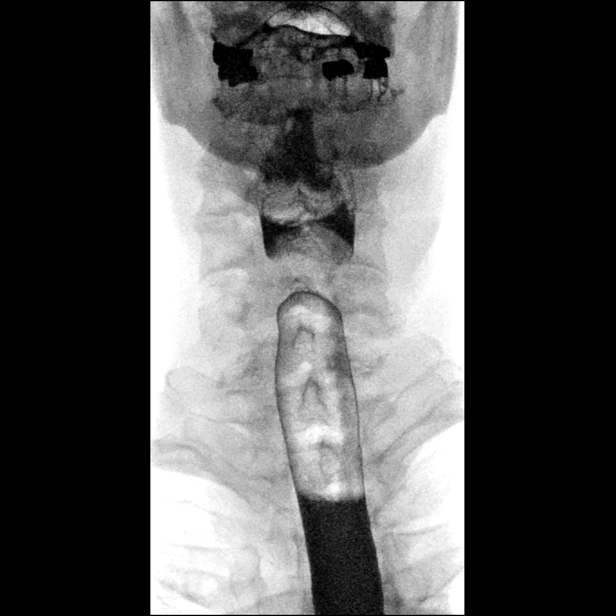
[frame 14/16]
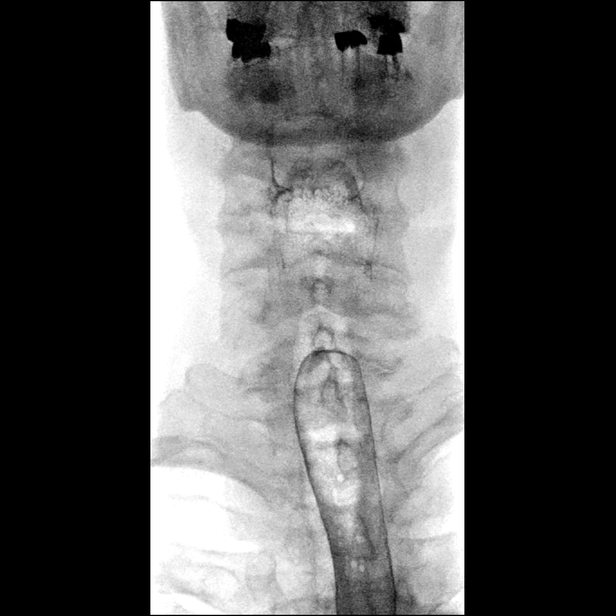

[Series 4: sequence · 1 of 18 frames shown (3 of 6)]
[frame 5/18]
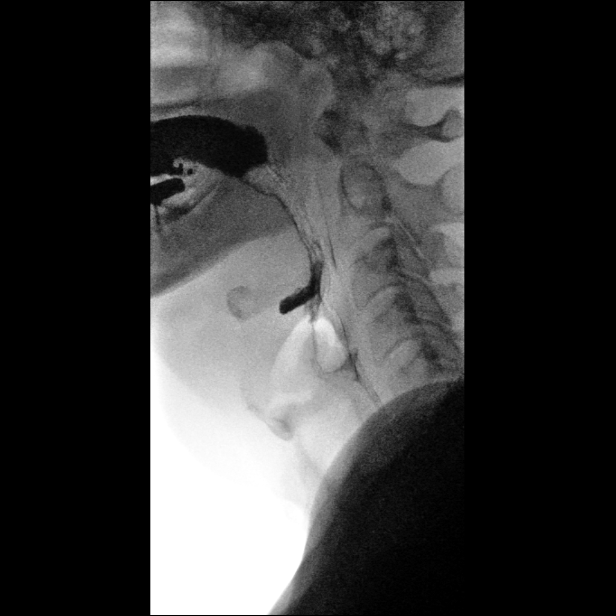

[Series 5: sequence · 2 of 36 frames shown (4 of 6)]
[frame 6/36]
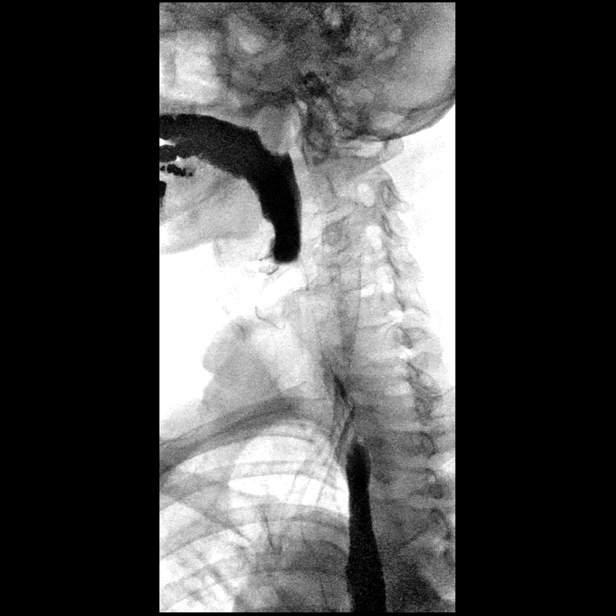
[frame 17/36]
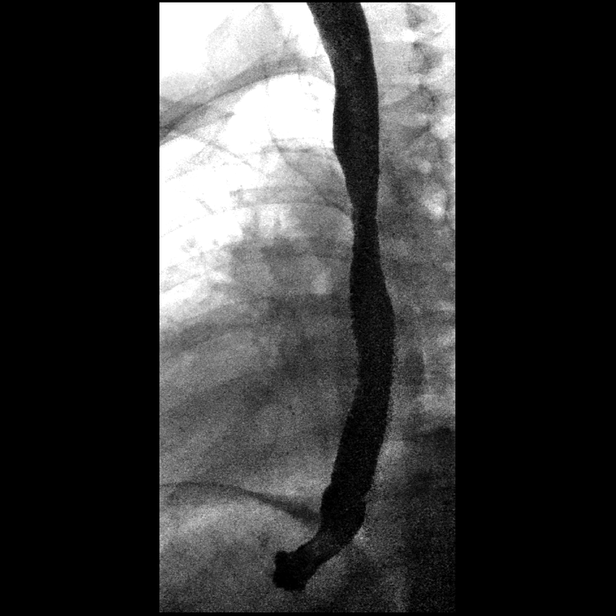

[Series 6: one shot · 1 of 2 slices shown (2 of 3)]
[im 1/2]
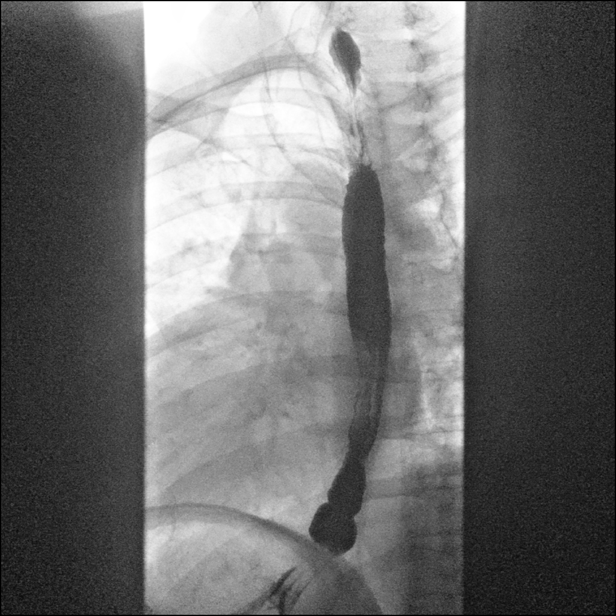

[Series 7: sequence · 2 of 17 frames shown (5 of 6)]
[frame 3/17]
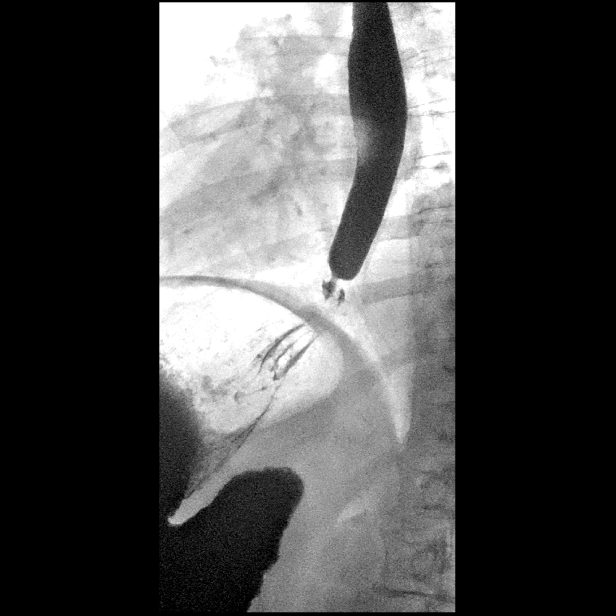
[frame 15/17]
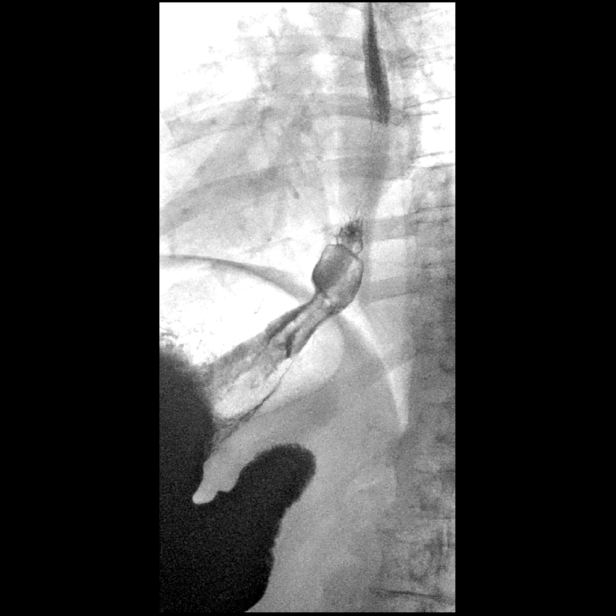

[Series 8: sequence · 2 of 8 frames shown (6 of 6)]
[frame 2/8]
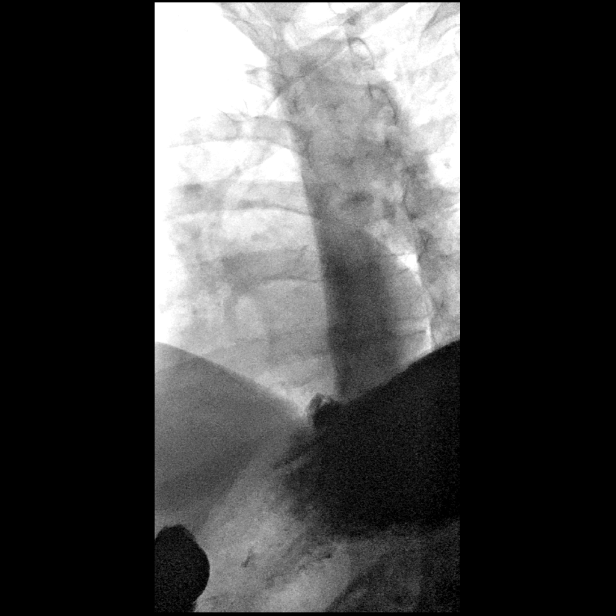
[frame 7/8]
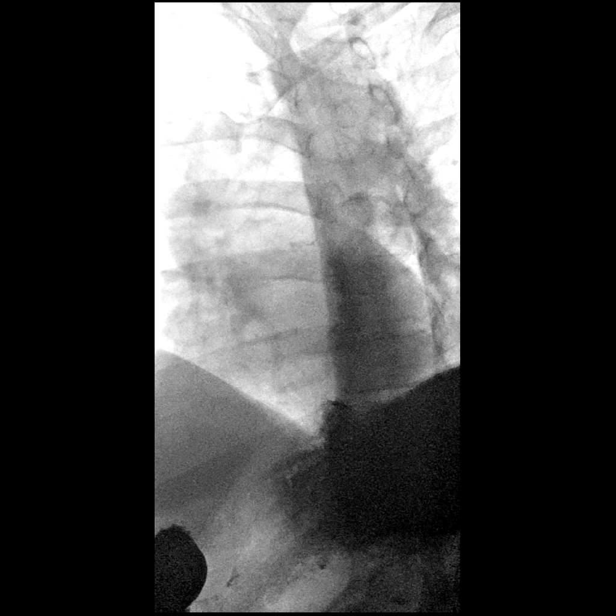

[Series 9: one shot · 1 of 3 slices shown (3 of 3)]
[im 3/3]
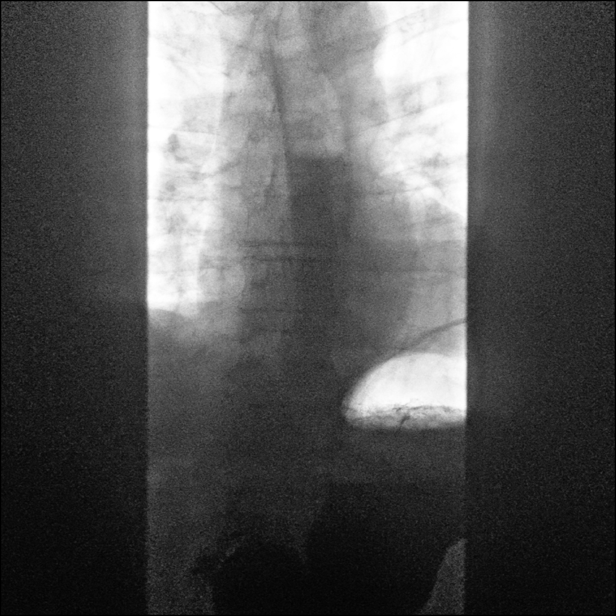

[14 of 24 positions shown; findings below may reference images not displayed]

FINDINGS: The patient swallowed the barium without difficulty. Rapid sequence
imaging of the pharynx in the AP and lateral projections
demonstrates no laryngeal penetration or focal mucosal abnormality.
There is no evidence esophageal diverticulum.

Mild presbyesophagus with mildly increased tertiary contractions.
There is a small reducible hiatal hernia without evidence of
esophageal ulceration or stricture.

Only trace gastroesophageal reflux was elicited with the water
siphon test.

A 13 mm barium tablet was administered and passed without delay into
the stomach.
IMPRESSION: 1. Small reducible hiatal hernia with trace gastroesophageal reflux.
2. Mild presbyesophagus.
3. No evidence of esophagitis or stricture. Barium tablet passed
without delay into the stomach.
4. No significant abnormality of the cervical esophagus identified.

## 2021-04-02 MED ORDER — LEVOTHYROXINE SODIUM 88 MCG PO TABS: 88.0000 ug | ORAL_TABLET | Freq: Every day | ORAL | 1 refills | Status: DC

## 2021-04-02 MED ORDER — ATORVASTATIN CALCIUM 40 MG PO TABS
40.0000 mg | ORAL_TABLET | Freq: Every day | ORAL | 1 refills | Status: DC
Start: 2021-04-02 — End: 2021-09-17

## 2021-04-02 MED ORDER — LEVOTHYROXINE SODIUM 88 MCG PO TABS: 88.0000 ug | ORAL_TABLET | Freq: Every day | ORAL | 0 refills | Status: DC

## 2021-04-02 NOTE — Addendum Note (Signed)
Addended byDamita Dunnings D on: 04/02/2021 10:50 AM   Modules accepted: Orders

## 2021-04-04 ENCOUNTER — Other Ambulatory Visit: Payer: Self-pay | Admitting: Internal Medicine

## 2021-04-22 DIAGNOSIS — K3189 Other diseases of stomach and duodenum: Secondary | ICD-10-CM | POA: Diagnosis not present

## 2021-04-22 DIAGNOSIS — K2100 Gastro-esophageal reflux disease with esophagitis, without bleeding: Secondary | ICD-10-CM | POA: Diagnosis not present

## 2021-04-22 DIAGNOSIS — K2210 Ulcer of esophagus without bleeding: Secondary | ICD-10-CM | POA: Diagnosis not present

## 2021-04-22 DIAGNOSIS — R1310 Dysphagia, unspecified: Secondary | ICD-10-CM | POA: Diagnosis not present

## 2021-04-22 DIAGNOSIS — K2900 Acute gastritis without bleeding: Secondary | ICD-10-CM | POA: Diagnosis not present

## 2021-04-23 ENCOUNTER — Encounter: Payer: Self-pay | Admitting: Internal Medicine

## 2021-04-23 DIAGNOSIS — R490 Dysphonia: Secondary | ICD-10-CM

## 2021-04-30 DIAGNOSIS — K2100 Gastro-esophageal reflux disease with esophagitis, without bleeding: Secondary | ICD-10-CM | POA: Diagnosis not present

## 2021-05-12 ENCOUNTER — Encounter: Payer: Self-pay | Admitting: Internal Medicine

## 2021-05-19 ENCOUNTER — Other Ambulatory Visit: Payer: Self-pay

## 2021-05-19 ENCOUNTER — Ambulatory Visit (INDEPENDENT_AMBULATORY_CARE_PROVIDER_SITE_OTHER): Payer: Medicare Other | Admitting: Internal Medicine

## 2021-05-19 VITALS — BP 147/70 | HR 49 | Temp 97.8°F | Resp 16 | Ht 76.0 in | Wt 266.0 lb

## 2021-05-19 DIAGNOSIS — E119 Type 2 diabetes mellitus without complications: Secondary | ICD-10-CM

## 2021-05-19 DIAGNOSIS — E039 Hypothyroidism, unspecified: Secondary | ICD-10-CM

## 2021-05-19 DIAGNOSIS — R030 Elevated blood-pressure reading, without diagnosis of hypertension: Secondary | ICD-10-CM

## 2021-05-19 DIAGNOSIS — R1310 Dysphagia, unspecified: Secondary | ICD-10-CM | POA: Diagnosis not present

## 2021-05-19 NOTE — Patient Instructions (Signed)
  GO TO THE FRONT DESK, PLEASE SCHEDULE YOUR APPOINTMENTS Come back for a physical exam by 07-2021

## 2021-05-19 NOTE — Progress Notes (Signed)
Subjective:    Patient ID: [name] [name], male    DOB: [birth year], 70 y.o.   MRN: [id number]  DOS:  05/19/2021 Type of visit - description: Follow-up  Today with talk about HTN, DM, dysphagia. He also has developed some hoarseness.  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 If you have any questions, ask your nurse or doctor.          Accu-Chek Aviva Plus w/Device Kit Use to check blood sugar   Accu-Chek Softclix Lancets lancets Use as instructed   aspirin 81 MG tablet Take 81 mg by mouth daily.   atorvastatin 40 MG tablet Commonly known as: LIPITOR Take 1 tablet (40 mg total) by mouth daily.   azelastine 0.1 % nasal spray Commonly known as: ASTELIN Place 2 sprays into both nostrils at bedtime.   fish oil-omega-3 fatty acids 1000 MG capsule Take 2 g by mouth daily.   fluticasone 50 MCG/ACT nasal spray Commonly known as: FLONASE Place into both nostrils daily.   glucose blood test strip Commonly known as: Accu-Chek Aviva Check blood sugar no more than twice daily   levothyroxine 88 MCG tablet Commonly known as: SYNTHROID Take 1 tablet (88 mcg total) by mouth daily before breakfast.   lisinopril 2.5 MG tablet Commonly known as: ZESTRIL Take 1 tablet (2.5 mg total) by mouth daily.   loratadine 10 MG tablet Commonly known as: CLARITIN Take 10 mg by  mouth daily.   melatonin 1 MG Tabs tablet Take 1 mg by mouth daily.   metFORMIN 500 MG 24 hr tablet Commonly known as: GLUCOPHAGE-XR Take 2 tablets (1,000 mg total) by mouth 2 (two) times daily.   metoprolol tartrate 25 MG tablet Commonly known as: LOPRESSOR Take 1 tablet (25 mg total) by mouth 2 (two) times daily.   multivitamin tablet Take 1 tablet by mouth daily.   mupirocin cream 2 % Commonly known as: BACTROBAN Apply 1 application topically as needed. Reported on 01/27/2016   omeprazole 40 MG capsule Commonly known as: PRILOSEC SMARTSIG:1 Capsule(s) By Mouth Morning-Evening   sildenafil 20 MG tablet Commonly known as: REVATIO TAKE 2 TO 3 TABLETS BY MOUTH ONCE DAILY AS NEEDED   sitaGLIPtin 100 MG tablet Commonly known as: Januvia Take 1 tablet (100 mg total) by mouth daily.   triamcinolone cream 0.1 % Commonly known as: KENALOG           Objective:   Physical Exam BP (!) 147/70 (BP Location: Left Arm, Patient Position: Sitting, Cuff Size: Large)   Pulse (!) 49   Temp 97.8 F (36.6 C) (Oral)   Resp 16   Ht '6\' 4"'  (1.93 m)   Wt 266 lb (120.7 kg)   SpO2  100%   BMI 32.38 kg/m  General:   Well developed, NAD, BMI noted. HEENT:  Normocephalic . Face symmetric, atraumatic Lungs:  CTA B Normal respiratory effort, no intercostal retractions, no accessory muscle use. Heart: RRR,  no murmur.  Lower extremities: no pretibial edema bilaterally  Skin: Not pale. Not jaundice Neurologic:  alert & oriented X3.  Speech normal, gait appropriate for age and unassisted Psych--  Cognition and judgment appear intact.  Cooperative with normal attention span and concentration.  Behavior appropriate. No anxious or depressed appearing.      Assessment     Assessment DM- on ACEi Hyperlipidemia Hypothyroidism DJD Pre-glaucoma E.D. Chronic R foot pain, s/p surgery w/ podiatry 2016  PLAN: Previous labs regards DM, hypothyroidism, elevated BP reviewed. DM:  Ambulatory CBGs ranged from 92-110.  Continue lisinopril, metformin, Januvia. Hypothyroidism: Well-balanced. Elevated BP.  On an ACE inhibitor for DM, metoprolol was added 03/31/2021, BP today slightly elevated at home is consistently between 110/67-120.  No change. Hypothyroidism: Last TSH satisfactory Preventive care, rec flu shot this season. Dysphagia:  had a barium swallow study, EGD, on PPIs, symptoms decreasing.  We will also see ENT soon. CAD, aortic calcification: CT findings includes CAD and aortic calcification,  to see cardiology soon RTC CPX 07-2021  This visit occurred during the SARS-CoV-2 public health emergency.  Safety protocols were in place, including screening questions prior to the visit, additional usage of staff PPE, and extensive cleaning of exam room while observing appropriate contact time as indicated for disinfecting solutions.

## 2021-05-20 NOTE — Assessment & Plan Note (Addendum)
Previous labs regards DM, hypothyroidism, elevated BP reviewed. DM: Ambulatory CBGs ranged from 92-110.  Continue lisinopril, metformin, Januvia. Hypothyroidism: Well-balanced. Elevated BP.  On an ACE inhibitor for DM, metoprolol was added 03/31/2021, BP today slightly elevated at home is consistently between 110/67-120.  No change. Hypothyroidism: Last TSH satisfactory Preventive care, rec flu shot this season. Dysphagia:  had a barium swallow study, EGD, on PPIs, symptoms decreasing.  We will also see ENT soon. CAD, aortic calcification: CT findings includes CAD and aortic calcification,  to see cardiology soon RTC CPX 07-2021

## 2021-05-21 ENCOUNTER — Encounter: Payer: Self-pay | Admitting: Internal Medicine

## 2021-05-29 ENCOUNTER — Ambulatory Visit (INDEPENDENT_AMBULATORY_CARE_PROVIDER_SITE_OTHER): Payer: Medicare Other | Admitting: Otolaryngology

## 2021-05-29 ENCOUNTER — Other Ambulatory Visit: Payer: Self-pay

## 2021-05-29 DIAGNOSIS — J310 Chronic rhinitis: Secondary | ICD-10-CM | POA: Diagnosis not present

## 2021-05-29 DIAGNOSIS — R1314 Dysphagia, pharyngoesophageal phase: Secondary | ICD-10-CM

## 2021-05-29 DIAGNOSIS — K219 Gastro-esophageal reflux disease without esophagitis: Secondary | ICD-10-CM | POA: Diagnosis not present

## 2021-05-29 DIAGNOSIS — R490 Dysphonia: Secondary | ICD-10-CM

## 2021-05-29 NOTE — Progress Notes (Signed)
HPI: [name] [name] is a 70 y.o. male who presents is referred by his PCP for evaluation of dysphagia as well as hoarseness.  He has already undergone upper GI endoscopy and was found to have an ulcer and is presently on omeprazole 40 mg twice daily for the past month and has it for another several weeks until follow-up with GI. He complains mostly of a a lot of thick mucus in his throat and hoarseness especially if he talks a lot.  He feels like his voice is worse at the end of the day. On conversation with him in the office today he does not have any significant hoarseness. He has had previous nasal septal surgery with Dr. Ernesto Rutherford little over 20 years ago.  He does complain of some postnasal drainage but no real difficulty breathing through his nose. He is on 2 nasal sprays Flonase as well as azelastine.. Of note he had a barium swallow performed 2 months ago that showed a small hiatal hernia and small amount of GE reflux disease but no esophageal stricture or cervical esophageal problem. He quit smoking approximately 4 years ago.  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   Colon polyps    Colon polyps at age 27, next Cscope age 75   Diabetes mellitus    DJD (degenerative joint disease)    Glaucoma suspect    Hyperlipidemia    Past Surgical History:  Procedure Laterality Date   BUNIONECTOMY Right 12/27/2014   [facility]   BUNIONECTOMY Left    2019   NASAL SEPTUM SURGERY  2000   SHOULDER ARTHROSCOPY     L 2011, R 2012   Social History   Socioeconomic History   Marital status: Married    Spouse name: Not on file   Number of children: 2   Years of education: Not on file   Highest education level: Not on file  Occupational History   Occupation: disability    Employer: [employer]  Tobacco Use   Smoking status: Former   Smokeless tobacco: Never   Tobacco comments:    quit on-off, no smoking since 12/2018  Substance and Sexual Activity   Alcohol  use: Yes    Alcohol/week: 2.0 standard drinks    Types: 2 Cans of beer per week    Comment: socially    Drug use: No   Sexual activity: Yes  Other Topics Concern   Not on file  Social History Narrative   Household-- pt and wife    Luz Lex a lot    Social Determinants of Radio broadcast assistant Strain: Low Risk    Difficulty of Paying Living Expenses: Not hard at all  Food Insecurity: No Food Insecurity   Worried About Charity fundraiser in the Last Year: Never true   Arboriculturist in the Last Year: Never true  Transportation Needs: No Transportation Needs   Lack of Transportation (Medical): No   Lack of Transportation (Non-Medical): No  Physical Activity: Insufficiently Active   Days of Exercise per Week: 3 days   Minutes of Exercise per Session: 30 min  Stress: No Stress Concern Present   Feeling of Stress : Not at all  Social Connections: Moderately Isolated   Frequency of Communication with Friends and Family: More than three times a week   Frequency of Social Gatherings with Friends and Family: More than three times a week   Attends Religious Services: Never   Active Member  of Clubs or Organizations: No   Attends Archivist Meetings: Never   Marital Status: Married   Family History  Problem Relation Age of Onset   Arthritis Mother    Alzheimer's disease Mother    Lung cancer Father    Stroke Other    Diabetes Other        uncle, brother (borderline)   GER disease Sister    Colon cancer Neg Hx    Prostate cancer Neg Hx    CAD Neg Hx    No Known Allergies Prior to Admission medications   Medication Sig Start Date End Date Taking? Authorizing Provider  Accu-Chek Softclix Lancets lancets Use as instructed 02/17/21   Colon Branch, MD  aspirin 81 MG tablet Take 81 mg by mouth daily.    [provider]  atorvastatin (LIPITOR) 40 MG tablet Take 1 tablet (40 mg total) by mouth daily. 04/02/21   Colon Branch, MD  azelastine (ASTELIN) 0.1 % nasal  spray Place 2 sprays into both nostrils at bedtime. 01/02/21   Colon Branch, MD  Blood Glucose Monitoring Suppl (ACCU-CHEK AVIVA PLUS) w/Device KIT Use to check blood sugar 12/02/15   Colon Branch, MD  fish oil-omega-3 fatty acids 1000 MG capsule Take 2 g by mouth daily.    [provider]  fluticasone (FLONASE) 50 MCG/ACT nasal spray Place into both nostrils daily.    [provider]  glucose blood (ACCU-CHEK AVIVA) test strip Check blood sugar no more than twice daily 02/17/21   Colon Branch, MD  levothyroxine (SYNTHROID) 88 MCG tablet Take 1 tablet (88 mcg total) by mouth daily before breakfast. 04/02/21   Colon Branch, MD  lisinopril (ZESTRIL) 2.5 MG tablet Take 1 tablet (2.5 mg total) by mouth daily. 12/10/20   Colon Branch, MD  loratadine (CLARITIN) 10 MG tablet Take 10 mg by mouth daily.    [provider]  Melatonin 1 MG TABS Take 1 mg by mouth daily.    [provider]  metFORMIN (GLUCOPHAGE-XR) 500 MG 24 hr tablet Take 2 tablets (1,000 mg total) by mouth 2 (two) times daily. 12/10/20   Colon Branch, MD  metoprolol tartrate (LOPRESSOR) 25 MG tablet Take 1 tablet (25 mg total) by mouth 2 (two) times daily. 03/31/21   Colon Branch, MD  Multiple Vitamin (MULTIVITAMIN) tablet Take 1 tablet by mouth daily.    [provider]  mupirocin cream (BACTROBAN) 2 % Apply 1 application topically as needed. Reported on 01/27/2016    [provider]  omeprazole (PRILOSEC) 40 MG capsule SMARTSIG:1 Capsule(s) By Mouth Morning-Evening 04/22/21   [provider]  sildenafil (REVATIO) 20 MG tablet TAKE 2 TO 3 TABLETS BY MOUTH ONCE DAILY AS NEEDED 07/04/19   Colon Branch, MD  sitaGLIPtin (JANUVIA) 100 MG tablet Take 1 tablet (100 mg total) by mouth daily. 12/10/20   Colon Branch, MD  triamcinolone cream (KENALOG) 0.1 %  12/17/14   [provider]     Positive ROS: Otherwise negative  All other systems have been reviewed and were otherwise negative with the  exception of those mentioned in the HPI and as above.  Physical Exam: Constitutional: Alert, well-appearing, no acute distress.  He has no significant hoarseness on conversation in the office today Ears: External ears without lesions or tenderness. Ear canals are clear bilaterally with intact, clear TMs.  Nasal: External nose without lesions. Septum relatively midline with mild rhinitis.. Clear nasal  passages bilaterally with clear middle meatus and no signs of active infection.. Oral: Lips and gums without lesions. Tongue and palate mucosa without lesions. Posterior oropharynx clear.  Tonsil regions are benign in appearance bilaterally.  Indirect laryngoscopy revealed a clear base of tongue vallecula and epiglottis. Fiberoptic laryngoscopy was performed through the right nostril.  The nasopharynx was clear.  The base of tongue vallecula epiglottis were normal.  The vocal cords were clear bilaterally with normal vocal mobility.  No vocal cord lesions noted.  Of note he did have a little bit of mucus on the vocal cords that cleared easily with coughing. Neck: No palpable adenopathy or masses.  No palpable thyroid nodules. Respiratory: Breathing comfortably  Skin: No facial/neck lesions or rash noted.  Laryngoscopy  Date/Time: 05/29/2021 4:40 PM Performed by: Rozetta Nunnery, MD Authorized by: Rozetta Nunnery, MD   Consent:    Consent obtained:  Verbal   Consent given by:  Patient Procedure details:    Indications: hoarseness, dysphagia, or aspiration     Medication:  Afrin   Instrument: flexible fiberoptic laryngoscope     Scope location: right nare   Sinus:    Right nasopharynx: normal   Mouth:    Oropharynx: normal     Vallecula: normal     Base of tongue: normal     Epiglottis: normal   Throat:    Pyriform sinus: normal     True vocal cords: normal   Comments:     On fiberoptic laryngoscopy through the right nostril the nasopharynx was clear.  No clinical evidence of  sinus infection.  The base of tongue vallecula epiglottis were normal.  Vocal cords were clear bilaterally with normal vocal mobility.  I note he did have some mucus on the vocal cords that cleared easily with coughing.  Assessment: History of hoarseness that seems to get worse when he talks a lot. GE reflux disease Clear vocal cords on fiberoptic laryngoscopy with no significant vocal cord pathology noted.  Plan: Recommended continuation with omeprazole and also suggested taking Mucinex in the morning to help with complaints of thick mucus in his throat.  Recommended drinking plenty of liquids which he states he is already doing. Also suggested use of nasal saline rinse in addition to the Flonase and azelastine that he is already using.  Suggested using the AYR or Xlear brand of saline nasal rinse. If the hoarseness symptoms do not improve over the following 4 weeks consider referral to speech therapy.   Radene Journey, MD   CC:

## 2021-05-30 DIAGNOSIS — H40013 Open angle with borderline findings, low risk, bilateral: Secondary | ICD-10-CM | POA: Diagnosis not present

## 2021-06-02 ENCOUNTER — Ambulatory Visit (HOSPITAL_BASED_OUTPATIENT_CLINIC_OR_DEPARTMENT_OTHER): Payer: Medicare Other | Admitting: Cardiology

## 2021-06-02 ENCOUNTER — Encounter (HOSPITAL_BASED_OUTPATIENT_CLINIC_OR_DEPARTMENT_OTHER): Payer: Self-pay | Admitting: Cardiology

## 2021-06-02 ENCOUNTER — Other Ambulatory Visit: Payer: Self-pay

## 2021-06-02 VITALS — BP 138/84 | HR 50 | Ht 76.0 in | Wt 264.0 lb

## 2021-06-02 DIAGNOSIS — I2510 Atherosclerotic heart disease of native coronary artery without angina pectoris: Secondary | ICD-10-CM | POA: Diagnosis not present

## 2021-06-02 DIAGNOSIS — I493 Ventricular premature depolarization: Secondary | ICD-10-CM

## 2021-06-02 DIAGNOSIS — E1129 Type 2 diabetes mellitus with other diabetic kidney complication: Secondary | ICD-10-CM

## 2021-06-02 DIAGNOSIS — Z7189 Other specified counseling: Secondary | ICD-10-CM

## 2021-06-02 DIAGNOSIS — I10 Essential (primary) hypertension: Secondary | ICD-10-CM | POA: Insufficient documentation

## 2021-06-02 DIAGNOSIS — R809 Proteinuria, unspecified: Secondary | ICD-10-CM

## 2021-06-02 DIAGNOSIS — I700 Atherosclerosis of aorta: Secondary | ICD-10-CM | POA: Diagnosis not present

## 2021-06-02 NOTE — Progress Notes (Addendum)
Cardiology Office Note:    Date:  06/02/2021   ID:  [name] [name], DOB 06/06/1951, MRN [id number]  PCP:  Colon Branch, MD  Cardiologist:  Buford Dresser, MD  Referring MD: Colon Branch, MD   Chief Complaint  Patient presents with   New Patient (Initial Visit)    Referred by Dr. Larose Kells d/t inconclusive ETT results---further eval.  Pt states no CP; states he has some SOB on exertion. Denies swelling and lightheadedness.    History of Present Illness:    [name] [name] is a 70 y.o. male with a hx of PVCs, type II diabetes mellitus, DJD, and hyperlipidemia, who is seen as a new consult at the request of Colon Branch, MD for the evaluation and management of atherosclerosis of native coronary artery of native heart without angina pectoris.  Cardiovascular risk factors: Prior clinical ASCVD: None Comorbid conditions, including hypertension, hyperlipidemia, diabetes, chronic kidney disease: Possible Hypertension recently, Diabetes Metabolic syndrome/Obesity: Highest adult weight 285 pounds (30 yrs ago) Chronic inflammatory conditions: Possible arthritis (trigger finger) Tobacco use history: Smoked previously, quit 3 years ago Family history: Uncle had a stroke. No other known cardiac history. Prior cardiac testing and/or incidental findings on other testing (ie coronary calcium): Testing 10 yrs ago. ETT 02/2021. Exercise level: Plays golf. Traveling and walking prior to COVID restrictions. Uses push mower and performs other yard work. Lately, has shortness of breath if moving/mowing faster outdoors, also attributes this to seasonal allergies. Using masks with yard work or simply slowing down helps him, he does not need to stop and rest. He is able to climb a hill without stopping, although he may slow down. Climbs stairs at home with no issues. Current diet: Junk foods while travelling. At home, his wife usually cooks meals. Avoids spaghetti and spicy foods. Wheat, vegetables 4 times a week.  Orders out rarely.  Overall [name] states he is feeling pretty good. Currently he is feeling no different than before. He is not having any pain or dizziness at this time, and is primarily here at the recommendation of Dr. Larose Kells. The ETT 02/2021 was indicated by his last physical in 07/2020, but he was not told why cardiology was consulted.  He has never felt any PVCs or other palpitations.  Of note, he was recently dx with a gastric ulcer. He denies any bleeding issues from taking aspirin. Within the previous 1-2 years, he has noticed easier bleeding/bruising. However he notes the bleeding does quit in a short time.  He denies any chest pain, lightheadedness, headaches, syncope, orthopnea, or PND. Also has no lower extremity edema.    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   Colon polyps    Colon polyps at age 46, next Cscope age 86   Diabetes mellitus    DJD (degenerative joint disease)    Glaucoma suspect    Hyperlipidemia     Past Surgical History:  Procedure Laterality Date   [facility] Right [facility]/27/2014   [facility]   BUNIONECTOMY Left    2019   NASAL SEPTUM SURGERY  2000   SHOULDER ARTHROSCOPY     L 2011, R 2012    Current Medications: Current Outpatient Medications on File Prior to Visit  Medication Sig   Accu-Chek Softclix Lancets lancets Use as instructed   aspirin 81 MG tablet Take 81 mg by mouth daily.   atorvastatin (LIPITOR) 40 MG tablet Take 1 tablet (40 mg total) by mouth daily.  azelastine (ASTELIN) 0.1 % nasal spray Place 2 sprays into both nostrils at bedtime.   Blood Glucose Monitoring Suppl (ACCU-CHEK AVIVA PLUS) w/Device KIT Use to check blood sugar   fish oil-omega-3 fatty acids 1000 MG capsule Take 2 g by mouth daily.   fluticasone (FLONASE) 50 MCG/ACT nasal spray Place into both nostrils daily.   glucose blood (ACCU-CHEK AVIVA) test strip Check blood sugar no more than twice daily   levothyroxine (SYNTHROID)  88 MCG tablet Take 1 tablet (88 mcg total) by mouth daily before breakfast.   lisinopril (ZESTRIL) 2.5 MG tablet Take 1 tablet (2.5 mg total) by mouth daily.   loratadine (CLARITIN) 10 MG tablet Take 10 mg by mouth daily.   Melatonin 1 MG TABS Take 1 mg by mouth daily.   metFORMIN (GLUCOPHAGE-XR) 500 MG 24 hr tablet Take 2 tablets (1,000 mg total) by mouth 2 (two) times daily.   metoprolol tartrate (LOPRESSOR) 25 MG tablet Take 1 tablet (25 mg total) by mouth 2 (two) times daily.   Multiple Vitamin (MULTIVITAMIN) tablet Take 1 tablet by mouth daily.   mupirocin cream (BACTROBAN) 2 % Apply 1 application topically as needed. Reported on 01/27/2016   omeprazole (PRILOSEC) 40 MG capsule SMARTSIG:1 Capsule(s) By Mouth Morning-Evening   sildenafil (REVATIO) 20 MG tablet TAKE 2 TO 3 TABLETS BY MOUTH ONCE DAILY AS NEEDED   sitaGLIPtin (JANUVIA) 100 MG tablet Take 1 tablet (100 mg total) by mouth daily.   triamcinolone cream (KENALOG) 0.1 %    No current facility-administered medications on file prior to visit.     Allergies:   Patient has no known allergies.   Social History   Tobacco Use   Smoking status: Former   Smokeless tobacco: Never   Tobacco comments:    quit on-off, no smoking since 12/2018  Substance Use Topics   Alcohol use: Yes    Alcohol/week: 2.0 standard drinks    Types: 2 Cans of beer per week    Comment: socially    Drug use: No    Family History: family history includes Alzheimer's disease in his mother; Arthritis in his mother; Diabetes in an other family member; GER disease in his sister; Lung cancer in his father; Stroke in an other family member. There is no history of Colon cancer, Prostate cancer, or CAD.  ROS:   Please see the history of present illness.  Additional pertinent ROS: Constitutional: Negative for chills, fever, night sweats, unintentional weight loss  HENT: Negative for ear pain and hearing loss.   Eyes: Negative for loss of vision and eye pain.   Respiratory: Negative for cough, sputum, wheezing.   Cardiovascular: See HPI. Gastrointestinal: Negative for abdominal pain, melena, and hematochezia.  Genitourinary: Negative for dysuria and hematuria.  Musculoskeletal: Negative for falls and myalgias.  Skin: Negative for itching and rash.  Neurological: Negative for focal weakness, focal sensory changes and loss of consciousness.  Endo/Heme/Allergies: Does bruise/bleed easily.     EKGs/Labs/Other Studies Reviewed:    The following studies were reviewed today:  CT lung 11/21/20 Cardiovascular: Heart size is normal. There is no significant pericardial fluid, thickening or pericardial calcification. There is aortic atherosclerosis, as well as atherosclerosis of the great vessels of the mediastinum and the coronary arteries, including calcified atherosclerotic plaque in the left main, left anterior descending, left circumflex and right coronary arteries. Mild calcifications of the aortic valve.  ETT 03/20/2021: Blood pressure demonstrated a hypertensive response to exercise. There was no ST segment deviation noted during stress.  ETT with poor exercise tolerance (1:37); no chest pain; hypertensive blood pressure response; no ST changes; frequent PVCs noted; negative inadequate exercise treadmill (patient achieved 80% target heart rate; ischemia at higher workloads cannot be excluded).  EKG:  EKG is personally reviewed.   06/02/2021: PVC.Sinus bradycardia at 50 bpm  Recent Labs: 08/21/2020: ALT 15; Hemoglobin 14.1; Platelets 215 02/17/2021: BUN 20; Creatinine, Ser 1.43; Potassium 4.8; Sodium 136 03/31/2021: TSH 2.47  Recent Lipid Panel    Component Value Date/Time   CHOL 126 02/17/2021 1039   TRIG 68.0 02/17/2021 1039   HDL 46.80 02/17/2021 1039   CHOLHDL 3 02/17/2021 1039   VLDL 13.6 02/17/2021 1039   LDLCALC 66 02/17/2021 1039   LDLCALC 73 08/21/2020 1135    Physical Exam:    VS:  BP 138/84   Pulse (!) 50   Ht _0   (1.93 m)   Wt 264 lb (119.7 kg)   BMI 32.14 kg/m     Wt Readings from Last 3 Encounters:  06/02/21 264 lb (119.7 kg)  05/19/21 266 lb (120.7 kg)  02/17/21 273 lb (123.8 kg)    GEN: Well nourished, well developed in no acute distress HEENT: Normal, moist mucous membranes NECK: No JVD CARDIAC: regular rhythm, normal S1 and S2, no rubs or gallops. No murmur. VASCULAR: Radial and DP pulses 2+ bilaterally. No carotid bruits RESPIRATORY:  Rhonchi in upper R lung field. Clear to auscultation without rales, wheezing  ABDOMEN: Soft, non-tender, non-distended MUSCULOSKELETAL:  Ambulates independently SKIN: Warm and dry, no edema NEUROLOGIC:  Alert and oriented x 3. No focal neuro deficits noted. PSYCHIATRIC:  Normal affect    ASSESSMENT:    1. Coronary artery disease involving native coronary artery of native heart without angina pectoris   2. Type 2 diabetes mellitus with microalbuminuria, without long-term current use of insulin (HCC)   3. Atherosclerosis of aorta (HCC)   4. Cardiac risk counseling   5. Counseling on health promotion and disease prevention   6. Coronary artery calcification seen on CT scan   7. Essential hypertension   8. Asymptomatic PVCs    PLAN:    CT with coronary calcifications and aortic atherosclerosis, consistent with CAD -no angina -on aspirin and atorvastatin. Last LDL 66, at goal of <70 -had ETT that was inconclusive. Given history of diabetes, if he develops symptoms, would recommend exercise nuclear stress test to get imaging -reviewed red flag warning signs that need immediate medical attention  Hypertension Type II diabetes, with reported prior proteinuria -on lisinopril for proteinuria, recently noted to have exercise hypertension as well -discussed both SGLT2 inhibitors and GLP1-RA today. Reviewed CV risk reduction as a benefit to medications, as well as weight loss with both. Reviewed contraindications and side effects to monitor for. We  specifically discussed risk of urinary tract/genital infections with SGLT2 inhibitors and risk of nausea/pancreatitis with GLP1-RA. -Wishes to discuss with their PCP prior to initiation. Will forward that I recommend starting one of these agents if no contraindications. I would recommend SGLT2i given history of proteinura  PVCs -asymptomatic -tolerating metoprolol  Cardiac risk counseling and prevention recommendations: -recommend heart healthy/Mediterranean diet, with whole grains, fruits, vegetable, fish, lean meats, nuts, and olive oil. Limit salt. -recommend moderate walking, 3-5 times/week for 30-50 minutes each session. Aim for at least 150 minutes.week. Goal should be pace of 3 miles/hours, or walking 1.5 miles in 30 minutes -recommend avoidance of tobacco products. Avoid excess alcohol. -ASCVD risk score: The ASCVD Risk score Mikey Bussing DC Brooke Bonito., et  al., 2013) failed to calculate for the following reasons:   The valid total cholesterol range is 130 to 320 mg/dL    Plan for follow up: 2 years or sooner as needed.  Buford Dresser, MD, PhD, [facility]    Medication Adjustments/Labs and Tests Ordered: Current medicines are reviewed at length with the patient today.  Concerns regarding medicines are outlined above.  Orders Placed This Encounter  Procedures   EKG 12-Lead   No orders of the defined types were placed in this encounter.  Patient Instructions  Medication Instructions:  Your Physician recommend you continue on your current medication as directed.    I recommend talking to Dr. Larose Kells about the class of medications called SGLT2i (ex; Vania Rea, Farxiga). This has heart and kidney benefit. As we discussed, you need to stay hydrated, and there is a risk of urinary tract infection.  *If you need a refill on your cardiac medications before your next appointment, please call your pharmacy*   Lab Work: None ordered today  Testing/Procedures: None  ordered today   Follow-Up: At [facility], you and your health needs are our priority.  As part of our continuing mission to provide you with exceptional heart care, we have created designated Provider Care Teams.  These Care Teams include your primary Cardiologist (physician) and Advanced Practice Providers (APPs -  Physician Assistants and Nurse Practitioners) who all work together to provide you with the care you need, when you need it.  We recommend signing up for the patient portal called "MyChart".  Sign up information is provided on this After Visit Summary.  MyChart is used to connect with patients for Virtual Visits (Telemedicine).  Patients are able to view lab/test results, encounter notes, upcoming appointments, etc.  Non-urgent messages can be sent to your provider as well.   To learn more about what you can do with MyChart, go to [email or web address].    Your next appointment:   2 year(s)  The format for your next appointment:   In Person  Provider:   Buford Dresser, MD    [facility],acting as a scribe for Buford Dresser, MD.,have documented all relevant documentation on the behalf of Buford Dresser, MD,as directed by  Buford Dresser, MD while in the presence of Buford Dresser, MD.  I, Buford Dresser, MD, have reviewed all documentation for this visit. The documentation on 06/02/21 for the exam, diagnosis, procedures, and orders are all accurate and complete.   Signed, Buford Dresser, MD PhD 06/02/2021 9:32 AM    [place]

## 2021-06-02 NOTE — Patient Instructions (Addendum)
Medication Instructions:  Your Physician recommend you continue on your current medication as directed.    I recommend talking to Dr. Larose Kells about the class of medications called SGLT2i (ex; Vania Rea, Farxiga). This has heart and kidney benefit. As we discussed, you need to stay hydrated, and there is a risk of urinary tract infection.  *If you need a refill on your cardiac medications before your next appointment, please call your pharmacy*   Lab Work: None ordered today  Testing/Procedures: None ordered today   Follow-Up: At [facility], you and your health needs are our priority.  As part of our continuing mission to provide you with exceptional heart care, we have created designated Provider Care Teams.  These Care Teams include your primary Cardiologist (physician) and Advanced Practice Providers (APPs -  Physician Assistants and Nurse Practitioners) who all work together to provide you with the care you need, when you need it.  We recommend signing up for the patient portal called "MyChart".  Sign up information is provided on this After Visit Summary.  MyChart is used to connect with patients for Virtual Visits (Telemedicine).  Patients are able to view lab/test results, encounter notes, upcoming appointments, etc.  Non-urgent messages can be sent to your provider as well.   To learn more about what you can do with MyChart, go to [email or web address].    Your next appointment:   2 year(s)  The format for your next appointment:   In Person  Provider:   Buford Dresser, MD

## 2021-06-05 ENCOUNTER — Other Ambulatory Visit: Payer: Self-pay | Admitting: Internal Medicine

## 2021-06-12 ENCOUNTER — Encounter: Payer: Self-pay | Admitting: Internal Medicine

## 2021-06-13 MED ORDER — EMPAGLIFLOZIN 10 MG PO TABS: 10.0000 mg | ORAL_TABLET | Freq: Every day | ORAL | 6 refills | Status: DC

## 2021-06-23 ENCOUNTER — Encounter: Payer: Self-pay | Admitting: Internal Medicine

## 2021-06-23 ENCOUNTER — Other Ambulatory Visit: Payer: Self-pay | Admitting: Gastroenterology

## 2021-06-23 DIAGNOSIS — R1310 Dysphagia, unspecified: Secondary | ICD-10-CM | POA: Diagnosis not present

## 2021-06-23 DIAGNOSIS — K219 Gastro-esophageal reflux disease without esophagitis: Secondary | ICD-10-CM | POA: Diagnosis not present

## 2021-06-25 ENCOUNTER — Other Ambulatory Visit: Payer: Self-pay | Admitting: Internal Medicine

## 2021-07-23 ENCOUNTER — Encounter (HOSPITAL_COMMUNITY)
Admission: RE | Disposition: A | Discharge status: Home / Self Care | Payer: Self-pay | Source: Home / Self Care | Attending: Gastroenterology

## 2021-07-23 ENCOUNTER — Encounter (HOSPITAL_COMMUNITY): Payer: Self-pay | Admitting: Gastroenterology

## 2021-07-23 ENCOUNTER — Ambulatory Visit (HOSPITAL_COMMUNITY)
Admission: RE | Admit: 2021-07-23 | Discharge: 2021-07-23 | Disposition: A | Discharge status: Home / Self Care | Payer: Medicare Other | Attending: Gastroenterology | Admitting: Gastroenterology

## 2021-07-23 DIAGNOSIS — R1310 Dysphagia, unspecified: Secondary | ICD-10-CM | POA: Diagnosis not present

## 2021-07-23 HISTORY — PX: ESOPHAGEAL MANOMETRY: SHX5429

## 2021-07-23 SURGERY — MANOMETRY, ESOPHAGUS

## 2021-07-23 MED ORDER — LIDOCAINE VISCOUS HCL 2 % MT SOLN
OROMUCOSAL | Status: AC
  Filled 2021-07-23: qty 15

## 2021-07-23 SURGICAL SUPPLY — 2 items
FACESHIELD LNG OPTICON STERILE (SAFETY) IMPLANT
GLOVE BIO SURGEON STRL SZ8 (GLOVE) ×4 IMPLANT

## 2021-07-23 NOTE — Progress Notes (Signed)
Esophageal manometry performed per protocol without complications.  Patient tolerated well. 

## 2021-07-29 DIAGNOSIS — K219 Gastro-esophageal reflux disease without esophagitis: Secondary | ICD-10-CM | POA: Diagnosis not present

## 2021-07-29 DIAGNOSIS — R1310 Dysphagia, unspecified: Secondary | ICD-10-CM | POA: Diagnosis not present

## 2021-07-31 ENCOUNTER — Encounter: Payer: Self-pay | Admitting: Internal Medicine

## 2021-08-12 ENCOUNTER — Encounter: Payer: Self-pay | Admitting: Internal Medicine

## 2021-08-15 ENCOUNTER — Telehealth (INDEPENDENT_AMBULATORY_CARE_PROVIDER_SITE_OTHER): Payer: Self-pay | Admitting: Otolaryngology

## 2021-08-15 NOTE — Telephone Encounter (Signed)
Patient had called me about referral to speech pathology because of persistent voice problems and hoarseness. I have made referral to [name] [name] for outpatient speech pathology evaluation and treatment and discussed this with the wife. Hopefully they will contact you next week concerning make an appointment. I also reviewed with the wife concerning continued treatment for his GE reflux disease. They will contact us next week if they have not heard from speech pathology.

## 2021-08-28 ENCOUNTER — Other Ambulatory Visit: Payer: Self-pay

## 2021-08-28 ENCOUNTER — Ambulatory Visit: Payer: Medicare Other | Attending: Otolaryngology

## 2021-08-28 DIAGNOSIS — R498 Other voice and resonance disorders: Secondary | ICD-10-CM | POA: Diagnosis not present

## 2021-08-28 DIAGNOSIS — R471 Dysarthria and anarthria: Secondary | ICD-10-CM | POA: Diagnosis not present

## 2021-08-28 DIAGNOSIS — R1310 Dysphagia, unspecified: Secondary | ICD-10-CM | POA: Insufficient documentation

## 2021-08-28 NOTE — Therapy (Signed)
[facility] [address] [place], [place], [address] Phone: [phone]   Fax:  [phone]  Speech Language Pathology Evaluation  Patient Details  Name: [name] [name] MRN: [id number] Date of Birth: 12-10-1950 Referring Provider (SLP): Rozetta Nunnery, MD (PCP/doc - Colon Branch MD)   Encounter Date: 08/28/2021   End of Session - 08/28/21 1549     Visit Number 1    Number of Visits 25    Date for SLP Re-Evaluation 11/21/21    Authorization Type [employer]    SLP Start Time 1400    SLP Stop Time  1444    SLP Time Calculation (min) 44 min    Activity Tolerance Patient tolerated treatment well             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   Colon polyps    Colon polyps at age 89, next Cscope age 79   Diabetes mellitus    DJD (degenerative joint disease)    Glaucoma suspect    Hyperlipidemia     Past Surgical History:  Procedure Laterality Date   BUNIONECTOMY Right 12/27/2014   @[facility]   BUNIONECTOMY Left    2019   ESOPHAGEAL MANOMETRY N/A 07/23/2021   Procedure: ESOPHAGEAL MANOMETRY (EM);  Surgeon: Wilford Corner, MD;  Location: [facility];  Service: Endoscopy;  Laterality: N/A;   NASAL SEPTUM SURGERY  2000   SHOULDER ARTHROSCOPY     L 2011, R 2012    There were no vitals filed for this visit.   Subjective Assessment - 08/28/21 1713     Currently in Pain? No/denies                SLP Evaluation [facility] - 08/28/21 1348       SLP Visit Information   SLP Received On 08/15/21    Referring Provider (SLP) Rozetta Nunnery, MD   PCP/doc - Colon Branch MD   Onset Date late March 2022    Medical Diagnosis Hoarseness; Voice quality disorder      Subjective   Patient/Family Stated Goal to improve voice      General Information   HPI Pt is a 70 y.o. male who presents is referred to ENT secondary to hoarseness.  He has already undergone  upper GI endoscopy and was found to have an ulcer and is presently on omeprazole 40 mg twice daily for the past month and has it for another several weeks until follow-up with GI. He complains mostly of a a lot of thick mucus in his throat and hoarseness especially if he talks a lot.  He feels like his voice is worse at the end of the day. On conversation with him in the office today he does not have any significant hoarseness. During fiberoptic laryngoscopy, base of tongue vallecula epiglottis were found to be normal.  Vocal cords were clear bilaterally with normal vocal mobility.  Mucus on the vocal cords noted that cleared easily with coughing. History of hoarseness that seems to get worse when he talks a lot. Recommended continuation with omeprazole and also suggested taking Mucinex in the morning to help with complaints of thick mucus in his throat.  Recommended drinking plenty of liquids which he states he is already doing.      Balance Screen   Has the patient fallen in the past 6 months No      Prior Functional Status   Cognitive/Linguistic Baseline  Within functional limits    Type of Home House     Lives With Spouse    Available Support Family    Education 2 semesters of college    Vocation Retired      Associate Professor   Overall Cognitive Status Within Functional Limits for tasks assessed      Auditory Comprehension   Overall Auditory Comprehension Appears within functional limits for tasks assessed      Verbal Expression   Overall Verbal Expression Appears within functional limits for tasks assessed      Oral Motor/Sensory Function   Overall Oral Motor/Sensory Function Impaired    Lingual Symmetry Within Functional Limits    Lingual Strength Reduced    Lingual Coordination Reduced    Facial Symmetry Within Functional Limits      Motor Speech   Overall Motor Speech Impaired    Respiration Impaired    Level of Impairment Conversation    Phonation Hoarse;Breathy    Articulation  Impaired    Level of Impairment Sentence    Intelligibility Intelligibility reduced    Effective Techniques Slow rate;Over-articulate    Phonation Impaired    Vocal Abuses Habitual Cough/Throat Clear    Volume Appropriate    Pitch Appropriate                             SLP Education - 08/28/21 1350     Education Details eval results, possible goals, vocal hygiene ed    Person(s) Educated Patient    Methods Explanation;Demonstration;Handout    Comprehension Verbalized understanding;Returned demonstration;Need further instruction              SLP Short Term Goals - 08/28/21 1606       SLP SHORT TERM GOAL #1   Title Pt will complete voice exercise HEP given occasional min A over 3 sessions    Time 4    Period Weeks    Status New      SLP SHORT TERM GOAL #2   Title Pt will demonstrate increased awareness of throat clearing and use appropriate substitute with 80% accuracy over 3 sessions    Time 4    Period Weeks    Status New      SLP SHORT TERM GOAL #3   Title Pt will utilize abdominal breathing for 18/20 sentence responses given rare min A over 2 sessions    Time 4    Period Weeks    Status New      SLP SHORT TERM GOAL #4   Title Pt will demonstrate compensations to maximize speech intelligiblity in 5 minute conversation given occasional min A over 2 sessions    Time 4    Period Weeks    Status New      SLP SHORT TERM GOAL #5   Title Pt will complete bedside swallow evaluation to determine need for instrumental swallow study (as needed)    Time 4    Period Weeks    Status New              SLP Long Term Goals - 08/28/21 1709       SLP LONG TERM GOAL #1   Title Pt will complete voice exercise HEP given rare min A over 3 sessions    Time 12    Period Weeks    Status New      SLP LONG TERM GOAL #2   Title Pt will follow vocal  hygiene protocol > 2 weeks given rare min A    Time 12    Period Weeks    Status New      SLP LONG  TERM GOAL #3   Title Pt will demonstrate awareness of hoarse voice and utilize abdominal breathing with 90% accuracy over 2 sessions    Time 12    Period Weeks    Status New      SLP LONG TERM GOAL #4   Title Pt will demonstrate compensations to maximize speech intelligibility in 15+ minute conversation given occasional min A over 2 sessions    Time 12    Period Weeks    Status New      SLP LONG TERM GOAL #5   Title Pt will undergo objective swallow study to evaluate pharyngeal function and assess for risk of aspiration as needed    Time 12    Period Weeks    Status New              Plan - 08/28/21 1350     Clinical Impression Statement "[name] [name]" was referred for OPST by ENT to evaluate and address hx of hoarseness. ENT completed fiberoptic laryngoscopy with clear bilateral cords and normal vocal cord mobility reported. Change in vocal quality reported around late March 2022, with ongoing hoarseness and decline in speech intelligibility noted. Increased saliva reported, which feels "heavy." Some throat clearing and coughing reported, with pt clearing throat x8 during evaluation. Reduced cough strength also indicated but can clear phlegm. Baseline allergies indicated and pt using Flonase. Pt is now taking Omeprazole to manage GERD (now decreased to 1x/day) for trace GERD identified during Barium Swallow. Perceptual voice evaluation completed today. Voice characterized as hoarse, monotone, and occasionally breathy and strained. Clavicular breathing and shortness of breath noted during longer conversational messages and paragraph readings. Maximum Phonation Time was 19 seconds with abnormal s:z ratio (13:21 seconds). Minimal voice use reported due to hoarseness and "slurred speech," which is frustrating and isolating for patient. Pt is often asked to repeat himself, particularly on the phone. Reduced lingual coordination exhibited during oral motor exam. Pt rated ~90% intelligible in quiet  environment, with pt self-monitoring rate of speech. Swallowing difficulty also persists as pt stated he gets "choked" on his saliva and rice. Pt denied hx of PNA. Due to change in vocal quality and possible concern for dysphagia, pt would benefit from skilled ST intervention to optimize vocal quality and increase safey during PO intake.    Speech Therapy Frequency 2x / week    Duration 12 weeks    Treatment/Interventions SLP instruction and feedback;Compensatory techniques;Functional tasks;Patient/family education;Compensatory strategies    Potential to Achieve Goals Good    Consulted and Agree with Plan of Care Patient             Patient will benefit from skilled therapeutic intervention in order to improve the following deficits and impairments:   Other voice and resonance disorders  Dysarthria and anarthria  Dysphagia, unspecified type    Problem List Patient Active Problem List   Diagnosis Date Noted   Asymptomatic PVCs 06/02/2021   Essential hypertension 06/02/2021   Coronary artery disease involving native coronary artery of native heart without angina pectoris 06/02/2021   Atherosclerosis of aorta ([place]) 06/02/2021   Chronic obstructive pulmonary disease ([place]) 02/18/2021   Allergic rhinitis 02/06/2020   Glaucoma suspect 10/02/2019   PCP NOTES >>> 08/07/2015   Hypothyroidism 05/01/2015   Neuroma of foot 03/29/2015   Status post  right foot surgery 01/22/2015   Plantar fasciitis of right foot 12/03/2014   Metatarsal deformity 12/03/2014   Equinus deformity of foot, acquired 12/03/2014   Bunion 12/03/2014   Annual physical exam 08/04/2012   Erectile dysfunction 08/04/2012   Type 2 diabetes mellitus with microalbuminuria, without long-term current use of insulin ([place]) 05/04/2012   Hyperlipidemia 05/04/2012   DJD (degenerative joint disease) 05/04/2012   Bradycardia 05/04/2012    Alinda Deem, MA CCC-SLP 08/28/2021, 5:16 PM  [facility] [address] [place], [place], [address] Phone: [phone]   Fax:  [phone]  Name: [name] [name] MRN: [id number] Date of Birth: 07/14/1951

## 2021-08-28 NOTE — Patient Instructions (Signed)
VOICE CONSERVATION PROGRAM  1. Avoid overuse of voice or excessive use of the voice The vocal cords can become easily fatigued. Try to sort out what is "necessary" versus "unnecessary" talking in your environment. You do not need to STOP talking, but try to limit it as much as possible.  Think of resting your voice just as long as you talk. For example, if you talk for 5 minutes, rest your voice completely for 5 minutes. If your voice feels "tired" during the day, try to rest it as much as possible.  Avoid using an excessively loud voice or shouting/raising your voice When you yell or raise your voice, the vocal cords slam into each other, much like a strong hand clap. This causes irritation, and if this irritation continues, hoarseness may increase. If people in your home talk loudly, ask them to reduce the volume of their voices to help you decrease your volume as well. Sit near or face the person to whom you are speaking.   3. Avoid talking over background noise When talking in background noise, speech automatically has increased loudness, and as in number 2 above, continued loud speech can result in increased hoarseness due to irritation to the vocal cords.  Do not talk over the radio or the TV. Mute them before speaking, go to a quieter place to talk, or sit next to the person with whom you are watching.  4. Talk in a voice that is soft, smooth, and gentle This allows the vocal cords to come together in a gentle way and allows the air being exhaled from the lungs to do most/all of the work when you are speaking.  5. Avoid excessive throat clearing, coughing, and loud laughter During these activities the vocal cords can also be slammed together in a hard way and foster irritation or swelling, which can cause hoarseness. Try taking sips of room temperature/cool water with hard swallows to clear secretions from the throat, instead of throat clearing or coughing. If you absolutely must  clear your throat, do so as gently as possible. If you find yourself clearing your throat or coughing a lot, consult your physician. You will want to laugh. Continue to do so, but softly and gently. Do not laugh loudly for long periods of time because this can increase the chances for vocal fold irritation and thus hoarseness. Lozenges can help reduce the need to clear throat/cough during cold/allergy season.  6. Keep the mouth and throat lubricated Drink at least 8-10 8 oz. glasses of water per day (64-80 oz.). This water can come in the form of drink mixes.  Caffeinated beverages such as colas, coffee, and tea (hot tea AND sweet tea) dry out the vocal cords and then can cause irritation and thus hoarseness.  Drinking water will keep your body hydrated. This will help to decrease secretions in the throat, which cause people to clear their throats or cough. If you are exercising outside (especially during drier weather), try to breathe through your nose as much as possible. If this is not possible, lifting the tongue up behind the upper teeth when breathing through the mouth will add some moisture to the air breathed in past the vocal cords.  7. Avoid mouth breathing - breathe through your nose Your nose serves as a natural filter for dust and dirt particles from the air, and as a humidifier to moisten the air. Vocal cords like to work in moistened, filtered air. When you breathe through your mouth you lose   the air filtering and moistening benefits of breathing through the nose. Be aware of breathing patterns when sitting quietly (e.g., reading or watching TV). Increase your awareness and try to change habits from mouth breathing to nose breathing during those times.  8. Avoid environmental and/or ingested irritants Try to avoid smoke-filled and or dusty environments. These items dry out the vocal cords and cause irritation.  9. Use an air filter if the home is dusty, and/or a humidifier if the air  is dry  This will help to maintain clean, humid air to breathe. Remember, this type of air is what the vocal cords like best.  

## 2021-08-29 ENCOUNTER — Encounter: Payer: Self-pay | Admitting: Internal Medicine

## 2021-08-29 ENCOUNTER — Ambulatory Visit (INDEPENDENT_AMBULATORY_CARE_PROVIDER_SITE_OTHER): Payer: Medicare Other | Admitting: Internal Medicine

## 2021-08-29 VITALS — BP 122/72 | HR 56 | Temp 97.5°F | Resp 16 | Ht 76.0 in | Wt 256.6 lb

## 2021-08-29 DIAGNOSIS — Z0001 Encounter for general adult medical examination with abnormal findings: Secondary | ICD-10-CM

## 2021-08-29 DIAGNOSIS — J410 Simple chronic bronchitis: Secondary | ICD-10-CM

## 2021-08-29 DIAGNOSIS — E119 Type 2 diabetes mellitus without complications: Secondary | ICD-10-CM | POA: Diagnosis not present

## 2021-08-29 DIAGNOSIS — I739 Peripheral vascular disease, unspecified: Secondary | ICD-10-CM

## 2021-08-29 DIAGNOSIS — R479 Unspecified speech disturbances: Secondary | ICD-10-CM

## 2021-08-29 DIAGNOSIS — J209 Acute bronchitis, unspecified: Secondary | ICD-10-CM

## 2021-08-29 DIAGNOSIS — E039 Hypothyroidism, unspecified: Secondary | ICD-10-CM | POA: Diagnosis not present

## 2021-08-29 DIAGNOSIS — Z0000 Encounter for general adult medical examination without abnormal findings: Secondary | ICD-10-CM

## 2021-08-29 DIAGNOSIS — J440 Chronic obstructive pulmonary disease with acute lower respiratory infection: Secondary | ICD-10-CM

## 2021-08-29 LAB — CBC WITH DIFFERENTIAL/PLATELET
Basophils Absolute: 0 10*3/uL (ref 0.0–0.1)
Basophils Relative: 0.3 % (ref 0.0–3.0)
Eosinophils Absolute: 0.4 10*3/uL (ref 0.0–0.7)
Eosinophils Relative: 5.5 % — ABNORMAL HIGH (ref 0.0–5.0)
HCT: 40 % (ref 39.0–52.0)
Hemoglobin: 13.4 g/dL (ref 13.0–17.0)
Lymphocytes Relative: 33 % (ref 12.0–46.0)
Lymphs Abs: 2.1 10*3/uL (ref 0.7–4.0)
MCHC: 33.4 g/dL (ref 30.0–36.0)
MCV: 94.9 fl (ref 78.0–100.0)
Monocytes Absolute: 0.6 10*3/uL (ref 0.1–1.0)
Monocytes Relative: 9.4 % (ref 3.0–12.0)
Neutro Abs: 3.3 10*3/uL (ref 1.4–7.7)
Neutrophils Relative %: 51.8 % (ref 43.0–77.0)
Platelets: 194 10*3/uL (ref 150.0–400.0)
RBC: 4.22 Mil/uL (ref 4.22–5.81)
RDW: 13.3 % (ref 11.5–15.5)
WBC: 6.4 10*3/uL (ref 4.0–10.5)

## 2021-08-29 LAB — COMPREHENSIVE METABOLIC PANEL
ALT: 26 U/L (ref 0–53)
AST: 19 U/L (ref 0–37)
Albumin: 4.2 g/dL (ref 3.5–5.2)
Alkaline Phosphatase: 61 U/L (ref 39–117)
BUN: 20 mg/dL (ref 6–23)
CO2: 29 mEq/L (ref 19–32)
Calcium: 8.8 mg/dL (ref 8.4–10.5)
Chloride: 100 mEq/L (ref 96–112)
Creatinine, Ser: 1.38 mg/dL (ref 0.40–1.50)
GFR: 52.32 mL/min — ABNORMAL LOW (ref >60.00)
Glucose, Bld: 111 mg/dL — ABNORMAL HIGH (ref 70–99)
Potassium: 4.4 mEq/L (ref 3.5–5.1)
Sodium: 136 mEq/L (ref 135–145)
Total Bilirubin: 0.8 mg/dL (ref 0.2–1.2)
Total Protein: 6.4 g/dL (ref 6.0–8.3)

## 2021-08-29 LAB — TSH: TSH: 1.8 u[IU]/mL (ref 0.35–5.50)

## 2021-08-29 LAB — HEMOGLOBIN A1C: Hgb A1c MFr Bld: 6.4 % (ref 4.6–6.5)

## 2021-08-29 NOTE — Assessment & Plan Note (Signed)
Here for CPX DM: Since the last visit due to CAD, Januvia was discontinued and he started [place].  He also takes metformin.  Ambulatory CBGs: 105.  Check A1c. Hyperlipidemia: On atorvastatin, last LDL satisfactory. CAD: Found coronary calcifications on CT scan for lung cancer screening, saw cardiology  August 2022, they were considering a nuclear stress test if symptoms develop.   He is complaining of DOE/wheezing, will start w/u with PFTs and then possibly refer back to cardiology for further testing if DOE is not explained by COPD.. Claudication: See ROS, will schedule ABIs. COPD: Per CT scan of the chest, having DOE and some wheezing per ROS. Plan: PFTs, further advised with results. Hoarseness, dysphagia: Chart reviewed.  Saw ENT 05/29/2021.  Fiberoptic laryngoscopy negative. His speech is not only hoarse but also very slow, related to a neurological issue  Refer to neurology.  DX dysphagia, speech disorder. RTC 3 months.

## 2021-08-29 NOTE — Patient Instructions (Signed)
We will refer you to neurology about your speech  Will order a test called pulmonary function tests  Will order a test called ABIs to check the circulation in your legs    GO TO THE LAB : Get the blood work     [place], [place] back for a checkup in 3 months    "Living will", "[place] of attorney": Advanced care planning  (If you already have a living will or healthcare power of attorney, please bring the copy to be scanned in your chart.)  Advance care planning is a process that supports adults in  understanding and sharing their preferences regarding future medical care.   The patient's preferences are recorded in documents called Advance Directives.    Advanced directives are completed (and can be modified at any time) while the patient is in full mental capacity.   The documentation should be available at all times to the patient, the family and the healthcare providers.  Bring in a copy to be scanned in your chart is an excellent idea and is recommended   This legal documents direct treatment decision making and/or appoint a surrogate to make the decision if the patient is not capable to do so.    Advance directives can be documented in many types of formats,  documents have names such as:  Lliving will  Durable power of attorney for healthcare (healthcare proxy or healthcare power of attorney)  Combined directives  Physician orders for life-sustaining treatment    More information at:  [email or web address]

## 2021-08-29 NOTE — Progress Notes (Signed)
Subjective:    Patient ID: [name] [name], male    DOB: 08-Oct-1951, 70 y.o.   MRN: [id number]  DOS:  08/29/2021 Type of visit - description: CPX  In addition to CPX, we discussed all his chronic medical problems, extensive chart review including ENT notes, cardiology notes.   Review of Systems The patient denies  fever chills When asked, admits to DOE with moderate exertion, for instance when he moves heavy furniture at home. Denies chest pain or palpitations.  Noted some wheezing with exercise. No cough. Also, having left calf tightness when walk, symptoms a started about 5 weeks ago. Denies any swelling or back pain. Hoarseness dysphagia ongoing, unchanged.    Other than above, a 14 point review of systems is negative      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   Colon polyps    Colon polyps at age 31, next Cscope age 8   Diabetes mellitus    DJD (degenerative joint disease)    Glaucoma suspect    Hyperlipidemia     Past Surgical History:  Procedure Laterality Date   BUNIONECTOMY Right 12/27/2014   [facility]   BUNIONECTOMY Left    2019   ESOPHAGEAL MANOMETRY N/A 07/23/2021   Procedure: ESOPHAGEAL MANOMETRY (EM);  Surgeon: Wilford Corner, MD;  Location: [facility];  Service: Endoscopy;  Laterality: N/A;   NASAL SEPTUM SURGERY  2000   SHOULDER ARTHROSCOPY     L 2011, R 2012    Social History   Socioeconomic History   Marital status: Married    Spouse name: Not on file   Number of children: 2   Years of education: Not on file   Highest education level: Not on file  Occupational History   Occupation: disability    Employer: [employer]  Tobacco Use   Smoking status: Former   Smokeless tobacco: Never   Tobacco comments:    quit on-off, no smoking since 12/2018  Substance and Sexual Activity   Alcohol use: Yes    Alcohol/week: 2.0 standard drinks    Types: 2 Cans of beer per week    Comment: socially    Drug use: No    Sexual activity: Yes  Other Topics Concern   Not on file  Social History Narrative   Household-- pt and wife    Luz Lex a lot    Social Determinants of Radio broadcast assistant Strain: Low Risk    Difficulty of Paying Living Expenses: Not hard at all  Food Insecurity: No Food Insecurity   Worried About Charity fundraiser in the Last Year: Never true   Arboriculturist in the Last Year: Never true  Transportation Needs: No Transportation Needs   Lack of Transportation (Medical): No   Lack of Transportation (Non-Medical): No  Physical Activity: Insufficiently Active   Days of Exercise per Week: 3 days   Minutes of Exercise per Session: 30 min  Stress: No Stress Concern Present   Feeling of Stress : Not at all  Social Connections: Moderately Isolated   Frequency of Communication with Friends and Family: More than three times a week   Frequency of Social Gatherings with Friends and Family: More than three times a week   Attends Religious Services: Never   Marine scientist or Organizations: No   Attends Archivist Meetings: Never   Marital Status: Married  Human resources officer Violence: Not At Risk   Fear of Current  or Ex-Partner: No   Emotionally Abused: No   Physically Abused: No   Sexually Abused: No     Allergies as of 08/29/2021   No Known Allergies      Medication List        Accurate as of August 29, 2021  1:55 PM. If you have any questions, ask your nurse or doctor.          STOP taking these medications    Januvia 100 MG tablet Generic drug: sitaGLIPtin Stopped by: Kathlene November, MD       TAKE these medications    Accu-Chek Aviva Plus w/Device Kit Use to check blood sugar   Accu-Chek Softclix Lancets lancets Use as instructed   aspirin 81 MG tablet Take 81 mg by mouth daily.   atorvastatin 40 MG tablet Commonly known as: LIPITOR Take 1 tablet (40 mg total) by mouth daily.   azelastine 0.1 % nasal spray Commonly known as:  ASTELIN Place 2 sprays into both nostrils at bedtime.   empagliflozin 10 MG Tabs tablet Commonly known as: Jardiance Take 1 tablet (10 mg total) by mouth daily before breakfast.   fish oil-omega-3 fatty acids 1000 MG capsule Take 2 g by mouth daily.   fluticasone 50 MCG/ACT nasal spray Commonly known as: FLONASE Place into both nostrils daily.   glucose blood test strip Commonly known as: Accu-Chek Aviva Check blood sugar no more than twice daily   levothyroxine 88 MCG tablet Commonly known as: SYNTHROID Take 1 tablet (88 mcg total) by mouth daily before breakfast.   lisinopril 2.5 MG tablet Commonly known as: ZESTRIL TAKE 1 TABLET BY MOUTH  DAILY   loratadine 10 MG tablet Commonly known as: CLARITIN Take 10 mg by mouth daily.   melatonin 1 MG Tabs tablet Take 1 mg by mouth daily.   metFORMIN 500 MG 24 hr tablet Commonly known as: GLUCOPHAGE-XR TAKE 2 TABLETS BY MOUTH  TWICE DAILY   metoprolol tartrate 25 MG tablet Commonly known as: LOPRESSOR TAKE 1 TABLET(25 MG) BY MOUTH TWICE DAILY   multivitamin tablet Take 1 tablet by mouth daily.   mupirocin cream 2 % Commonly known as: BACTROBAN Apply 1 application topically as needed. Reported on 01/27/2016   omeprazole 40 MG capsule Commonly known as: PRILOSEC SMARTSIG:1 Capsule(s) By Mouth Morning-Evening   sildenafil 20 MG tablet Commonly known as: REVATIO TAKE 2 TO 3 TABLETS BY MOUTH ONCE DAILY AS NEEDED   triamcinolone cream 0.1 % Commonly known as: KENALOG           Objective:   Physical Exam BP 122/72   Pulse (!) 56   Temp (!) 97.5 F (36.4 C)   Resp 16   Ht '6\' 4"'  (1.93 m)   Wt 256 lb 9.6 oz (116.4 kg)   SpO2 97%   BMI 31.23 kg/m  General: Well developed, NAD, BMI noted Neck: No  thyromegaly  HEENT:  Normocephalic . Face symmetric, atraumatic. Voice is hoarse and somewhat slow. Lungs:  CTA B Normal respiratory effort, no intercostal retractions, no accessory muscle use. Heart: RRR,  no  murmur.  Abdomen:  Not distended, soft, non-tender. No rebound or rigidity.   Lower extremities: Normal femoral pulses Present but decreased pedal pulses.  Good capillary refill all toes. No pretibial edema bilaterally  Skin: Exposed areas without rash. Not pale. Not jaundice Neurologic:  alert & oriented X3.  Speech normal, gait appropriate for age and unassisted Strength symmetric and appropriate for age.  Psych: Cognition and judgment  appear intact.  Cooperative with normal attention span and concentration.  Behavior appropriate. No anxious or depressed appearing. Decreased breath sounds at bases    Assessment      Assessment DM- on ACEi Hyperlipidemia Hypothyroidism DJD Pre-glaucoma E.D. Chronic R foot pain, s/p surgery w/ podiatry 2016 COPD per CT for lung ca screening Ca+ noted on coronary arteries >>> CT scan chest 10-2020 Hoarseness Dysphagia  PLAN: Here for CPX DM: Since the last visit due to CAD, Januvia was discontinued and he started Jardiance.  He also takes metformin.  Ambulatory CBGs: 105.  Check A1c. Hyperlipidemia: On atorvastatin, last LDL satisfactory. CAD: Found coronary calcifications on CT scan for lung cancer screening, saw cardiology  August 2022, they were considering a nuclear stress test if symptoms develop.   He is complaining of DOE/wheezing, will start w/u with PFTs and then possibly refer back to cardiology for further testing if DOE is not explained by COPD.. Claudication: See ROS, will schedule ABIs. COPD: Per CT scan of the chest, having DOE and some wheezing per ROS. Plan: PFTs, further advised with results. Hoarseness, dysphagia: Chart reviewed.  Saw ENT 05/29/2021.  Fiberoptic laryngoscopy negative. His speech is not only hoarse but also very slow, related to a neurological issue  Refer to neurology.  DX dysphagia, speech disorder. RTC 3 months.    In addition to CPX, I managed his chronic medical problems, unresolved issues including  hoarseness, dysphagia.  A new issue: Claudication.  This visit occurred during the SARS-CoV-2 public health emergency.  Safety protocols were in place, including screening questions prior to the visit, additional usage of staff PPE, and extensive cleaning of exam room while observing appropriate contact time as indicated for disinfecting solutions.

## 2021-08-29 NOTE — Assessment & Plan Note (Signed)
-  Td 2013 - Zostavax 11-2012; s/p Shingrix:   - Pneumonia 23: 2015 and 2020;   prevnar:2015 -Had COVID vaccines x5 including the  bivalent booster  - had a Flu shot   -Prostate cancer screening: DRE - PSA wnl 07-2020 -CCS: C-scope at age 70. No records.  C-scope 08-2014, + polyps C-scope 01-2020, next per GI -Lung cancer screening: Next CT 10-2021 -smoker: Tobacco free since 12-2018.  Congratulated.  -Diet and exercise discussed. -Labs: CBC, CMP, A1c, TSH - POA d/w pt

## 2021-09-01 ENCOUNTER — Other Ambulatory Visit: Payer: Self-pay

## 2021-09-01 ENCOUNTER — Encounter: Payer: Self-pay | Admitting: Speech Pathology

## 2021-09-01 ENCOUNTER — Ambulatory Visit: Payer: Medicare Other | Admitting: Speech Pathology

## 2021-09-01 ENCOUNTER — Encounter: Payer: Self-pay | Admitting: Neurology

## 2021-09-01 DIAGNOSIS — R471 Dysarthria and anarthria: Secondary | ICD-10-CM | POA: Diagnosis not present

## 2021-09-01 DIAGNOSIS — R1310 Dysphagia, unspecified: Secondary | ICD-10-CM

## 2021-09-01 DIAGNOSIS — R498 Other voice and resonance disorders: Secondary | ICD-10-CM

## 2021-09-01 NOTE — Patient Instructions (Addendum)
     Energy conservation if you have an event at night, rest during the day to preserve your speech and voice  Swallow hard or sip and swallow hard when you feel saliva or phlegm in your throat  When we lift or strain, we use our larynx to help stabilize our bodies by closing the vocal folds   This week when you are hoarse, focus on breath support - you will have to breathe more frequently to give your voice some power and reduce hoarseness  Have your wife help you notice when you are hoarse or straining to get your words out - to remind you to breathe  If  your swallowing gets worse, we will consider a swallow x-ray called a modified barium swallow which watches you swallow a variety of consistencies and a pill focusing on your throat  SLOW LOUD OVER-ENNUNCIATE PAUSE  SLOW AND BIG - EXAGGERATE [place], MAKE EACH CONSONANT

## 2021-09-01 NOTE — Therapy (Signed)
[facility] [address] [place], [place], [address] Phone: [phone]   Fax:  [phone]  Speech Language Pathology Treatment  Patient Details  Name: [name] [name] MRN: [id number] Date of Birth: [birth year] Referring Provider (SLP): Rozetta Nunnery, MD (PCP/doc - Colon Branch MD)   Encounter Date: 09/01/2021   End of Session - 09/01/21 1602     Visit Number 1    Number of Visits 25    Date for SLP Re-Evaluation 11/21/21    Authorization Type UHC Medicare    SLP Start Time 69    SLP Stop Time  1315    SLP Time Calculation (min) 45 min    Activity Tolerance Patient tolerated treatment well             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   Colon polyps    Colon polyps at age 70, next Cscope age 70   Diabetes mellitus    DJD (degenerative joint disease)    Glaucoma suspect    Hyperlipidemia     Past Surgical History:  Procedure Laterality Date   BUNIONECTOMY Right 12/27/2014   @[facility]   BUNIONECTOMY Left    2019   ESOPHAGEAL MANOMETRY N/A 07/23/2021   Procedure: ESOPHAGEAL MANOMETRY (EM);  Surgeon: Wilford Corner, MD;  Location: [facility];  Service: Endoscopy;  Laterality: N/A;   NASAL SEPTUM SURGERY  2000   SHOULDER ARTHROSCOPY     L 2011, R 2012    There were no vitals filed for this visit.   Subjective Assessment - 09/01/21 1240     Subjective "It's still there"    Currently in Pain? No/denies                   ADULT SLP TREATMENT - 09/01/21 1235       General Information   Behavior/Cognition Alert;Cooperative;Pleasant mood      Treatment Provided   Treatment provided Cognitive-Linquistic;Dysphagia      Dysphagia Treatment   Temperature Spikes Noted No    Respiratory Status Room air    Oral Cavity - Dentition Adequate natural dentition    Treatment Methods Skilled observation;Compensation strategy training    Patient  observed directly with PO's Yes    Type of PO's observed Regular;Thin liquids    Feeding Able to feed self    Liquids provided via Cup    Oral Phase Signs & Symptoms Other (comment)   diffuse oral residue   Pharyngeal Phase Signs & Symptoms Delayed throat clear;Audible swallow;Complaints of globus;Complaints of residue    Type of cueing Verbal    Amount of cueing Minimal    Other treatment/comments CSE completed today due to ongoing concerns of dysphagia with solids. PO trials of regular solid (peanut butter cracker) revealed multiple throat clears and pt reporting increased globus sensation as evaluation progressed. Hard swallow did not eliminate this however liquid sip was most helpful. Successive thin liquid sips revealed no overt s/s of aspiration when taken in absence of solids. We discussed MBSS in future if dysphagia progresses or pending neurology consult      Cognitive-Linquistic Treatment   Treatment focused on Voice;Patient/family/caregiver education;Dysarthria    Skilled Treatment Speech is progrssively getting worse.Neurology appointment in Feb 2023, suggested he call weekly and check for cancellations. [place] verbalized compensations to improve intelligilbity with mod I. He reports consistently carrying over slow rate and some over articulation. Educated Geographical information systems officer on energy conservation for speech  and swallowing to maintain intelligiblity for prioritized events/situation. Trained Glendell Docker in more frequent breath support and volume in the evening to reduce increased dysphonia at that time. Dysphonia not observed today. Due to unclear etiology of dysphagia, dysarthria and dysphonia, I am reluctant to start formal HEP due this fatiguing muscles. Will re-assess next sesion and consider SOVTE      Assessment / Recommendations / [place] with current plan of care      Progression Toward Goals   Progression toward goals Progressing toward goals              SLP Education - 09/01/21  1556     Education Details compensations for dysarthria and dysphonia; MBSS if dysphagia progresses    Person(s) Educated Patient    Methods Explanation;Demonstration;Verbal cues;Handout    Comprehension Verbalized understanding;Verbal cues required;Need further instruction              SLP Short Term Goals - 09/01/21 1601       SLP SHORT TERM GOAL #1   Title Pt will complete voice exercise HEP given occasional min A over 3 sessions    Time 4    Period Weeks    Status On-going      SLP SHORT TERM GOAL #2   Title Pt will demonstrate increased awareness of throat clearing and use appropriate substitute with 80% accuracy over 3 sessions    Time 4    Period Weeks    Status On-going      SLP SHORT TERM GOAL #3   Title Pt will utilize abdominal breathing for 18/20 sentence responses given rare min A over 2 sessions    Time 4    Period Weeks    Status On-going      SLP SHORT TERM GOAL #4   Title Pt will demonstrate compensations to maximize speech intelligiblity in 5 minute conversation given occasional min A over 2 sessions    Time 4    Period Weeks    Status On-going      SLP SHORT TERM GOAL #5   Title Pt will complete bedside swallow evaluation to determine need for instrumental swallow study (as needed)    Time 4    Period Weeks    Status Achieved              SLP Long Term Goals - 09/01/21 1602       SLP LONG TERM GOAL #1   Title Pt will complete voice exercise HEP given rare min A over 3 sessions    Time 12    Period Weeks    Status On-going      SLP LONG TERM GOAL #2   Title Pt will follow vocal hygiene protocol > 2 weeks given rare min A    Time 12    Period Weeks    Status On-going      SLP LONG TERM GOAL #3   Title Pt will demonstrate awareness of hoarse voice and utilize abdominal breathing with 90% accuracy over 2 sessions    Time 12    Period Weeks    Status On-going      SLP LONG TERM GOAL #4   Title Pt will demonstrate compensations to  maximize speech intelligibility in 15+ minute conversation given occasional min A over 2 sessions    Time 12    Period Weeks    Status On-going      SLP LONG TERM GOAL #5   Title  Pt will undergo objective swallow study to evaluate pharyngeal function and assess for risk of aspiration as needed    Time 12    Period Weeks    Status On-going              Plan - 09/01/21 1558     Clinical Impression Statement Davit continues to present with worsening dysarthria, dysphonia and dysphagia. Intelligiblity is affected. Clinical swallow evaluation completed today - See skilled intervention. Initiated training in compensations for dysarthria and dysphonia. Will consider MBSS if dysphagia progresses or pending neurological dx. Continue skilled ST to maximize intelligibility, safety of swallow and optimal voice quality for safety, QOL    Speech Therapy Frequency 2x / week    Duration 12 weeks    Treatment/Interventions SLP instruction and feedback;Compensatory techniques;Functional tasks;Patient/family education;Compensatory strategies    Potential to Achieve Goals Fair    Potential Considerations Other (comment)   unknown etiology - await neurology consul            Patient will benefit from skilled therapeutic intervention in order to improve the following deficits and impairments:   Other voice and resonance disorders  Dysarthria and anarthria  Dysphagia, unspecified type    Problem List Patient Active Problem List   Diagnosis Date Noted   Asymptomatic PVCs 06/02/2021   Essential hypertension 06/02/2021   Coronary artery disease involving native coronary artery of native heart without angina pectoris 06/02/2021   Atherosclerosis of aorta ([place]) 06/02/2021   Chronic obstructive pulmonary disease ([place]) 02/18/2021   Allergic rhinitis 02/06/2020   Glaucoma suspect 10/02/2019   PCP NOTES >>> 08/07/2015   Hypothyroidism 05/01/2015   Neuroma of foot 03/29/2015   Status post right  foot surgery 01/22/2015   Plantar fasciitis of right foot 12/03/2014   Metatarsal deformity 12/03/2014   Equinus deformity of foot, acquired 12/03/2014   Bunion 12/03/2014   Annual physical exam 08/04/2012   Erectile dysfunction 08/04/2012   Type 2 diabetes mellitus with microalbuminuria, without long-term current use of insulin ([place]) 05/04/2012   Hyperlipidemia 05/04/2012   DJD (degenerative joint disease) 05/04/2012   Bradycardia 05/04/2012    Forrester Blando, Annye Rusk MS, CCC-SLP 09/01/2021, 4:04 PM  [facility] [address] [place], [place], [address] Phone: [phone]   Fax:  [phone]   Name: CAIUS SILBERNAGEL MRN: [id number] Date of Birth: 1951-07-21

## 2021-09-03 ENCOUNTER — Encounter: Payer: Self-pay | Admitting: Internal Medicine

## 2021-09-03 NOTE — Telephone Encounter (Signed)
See below

## 2021-09-04 ENCOUNTER — Other Ambulatory Visit: Payer: Self-pay

## 2021-09-04 ENCOUNTER — Encounter: Payer: Medicare Other | Admitting: Internal Medicine

## 2021-09-04 ENCOUNTER — Ambulatory Visit: Payer: Medicare Other

## 2021-09-04 DIAGNOSIS — R1310 Dysphagia, unspecified: Secondary | ICD-10-CM

## 2021-09-04 DIAGNOSIS — R498 Other voice and resonance disorders: Secondary | ICD-10-CM

## 2021-09-04 DIAGNOSIS — R471 Dysarthria and anarthria: Secondary | ICD-10-CM | POA: Diagnosis not present

## 2021-09-04 NOTE — Therapy (Signed)
[facility] [address] [place], [place], [address] Phone: [phone]   Fax:  [phone]  Speech Language Pathology Treatment  Patient Details  Name: [name] [name] MRN: [id number] Date of Birth: 1951/04/29 Referring Provider (SLP): Rozetta Nunnery, MD (PCP/doc - Colon Branch MD)   Encounter Date: 09/04/2021   End of Session - 09/04/21 1013     Visit Number 3    Number of Visits 25    Date for SLP Re-Evaluation 11/21/21    Authorization Type [employer] Medicare    SLP Start Time 1015    SLP Stop Time  1100    SLP Time Calculation (min) 45 min    Activity Tolerance Patient tolerated treatment well             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   Colon polyps    Colon polyps at age 4, next Cscope age 32   Diabetes mellitus    DJD (degenerative joint disease)    Glaucoma suspect    Hyperlipidemia     Past Surgical History:  Procedure Laterality Date   BUNIONECTOMY Right 12/27/2014   @[facility]   BUNIONECTOMY Left    2019   ESOPHAGEAL MANOMETRY N/A 07/23/2021   Procedure: ESOPHAGEAL MANOMETRY (EM);  Surgeon: Wilford Corner, MD;  Location: [facility];  Service: Endoscopy;  Laterality: N/A;   NASAL SEPTUM SURGERY  2000   SHOULDER ARTHROSCOPY     L 2011, R 2012    There were no vitals filed for this visit.   Subjective Assessment - 09/04/21 1014     Subjective "I'm not using (my voice) much"    Currently in Pain? No/denies                   ADULT SLP TREATMENT - 09/04/21 1012       General Information   Behavior/Cognition Alert;Cooperative;Pleasant mood      Treatment Provided   Treatment provided Cognitive-Linquistic      Cognitive-Linquistic Treatment   Treatment focused on Voice;Patient/family/caregiver education;Dysarthria    Skilled Treatment Pt presented with slow rate of speech and reduced oral motor coordination. No hoarseness  exhibited intially, which mildly increased as session progressed. SLP reviewed abdominal breathing to optimize breath support. SLP introduced SOVTE this session. Pitch range via straw in/out of water was limited for high pitch. Shoulder tension immediately demonstrated. Cued relaxation was minimally effective. Pt reported limited awareness of vocal cord movement during phonation into water. Pt endorsed more "vibration" felt while talking wihch appears correlated to back focused phonation. SLP introduced flow phonation to aid forward focused phonation. Throat clear x2 exhibited this session. SLP reviewed hard swallow or sip of water.      Assessment / Recommendations / Plan   Plan Continue with current plan of care      Progression Toward Goals   Progression toward goals Progressing toward goals              SLP Education - 09/04/21 1253     Education Details SOVTE, flow phonation    Person(s) Educated Patient    Methods Explanation;Demonstration;Handout;Verbal cues    Comprehension Verbalized understanding;Returned demonstration;Need further instruction              SLP Short Term Goals - 09/04/21 1014       SLP SHORT TERM GOAL #1   Title Pt will complete voice exercise HEP given occasional min A over 3  sessions    Time 4    Period Weeks    Status On-going      SLP SHORT TERM GOAL #2   Title Pt will demonstrate increased awareness of throat clearing and use appropriate substitute with 80% accuracy over 3 sessions    Time 4    Period Weeks    Status On-going      SLP SHORT TERM GOAL #3   Title Pt will utilize abdominal breathing for 18/20 sentence responses given rare min A over 2 sessions    Time 4    Period Weeks    Status On-going      SLP SHORT TERM GOAL #4   Title Pt will demonstrate compensations to maximize speech intelligiblity in 5 minute conversation given occasional min A over 2 sessions    Time 4    Period Weeks    Status On-going      SLP SHORT TERM  GOAL #5   Title Pt will complete bedside swallow evaluation to determine need for instrumental swallow study (as needed)    Status Achieved              SLP Long Term Goals - 09/04/21 1014       SLP LONG TERM GOAL #1   Title Pt will complete voice exercise HEP given rare min A over 3 sessions    Time 12    Period Weeks    Status On-going      SLP LONG TERM GOAL #2   Title Pt will follow vocal hygiene protocol > 2 weeks given rare min A    Time 12    Period Weeks    Status On-going      SLP LONG TERM GOAL #3   Title Pt will demonstrate awareness of hoarse voice and utilize abdominal breathing with 90% accuracy over 2 sessions    Time 12    Period Weeks    Status On-going      SLP LONG TERM GOAL #4   Title Pt will demonstrate compensations to maximize speech intelligibility in 15+ minute conversation given occasional min A over 2 sessions    Time 12    Period Weeks    Status On-going      SLP LONG TERM GOAL #5   Title Pt will undergo objective swallow study to evaluate pharyngeal function and assess for risk of aspiration as needed    Time 12    Period Weeks    Status On-going              Plan - 09/04/21 1013     Clinical Impression Statement [name] continues to present with worsening dysarthria, dysphonia and dysphagia. Intelligiblity is affected. Initiated training in compensations for dysarthria and dysphonia. Will consider MBSS if dysphagia progresses or pending neurological dx. Continue skilled ST to maximize intelligibility, safety of swallow and optimal voice quality for safety, QOL    Speech Therapy Frequency 2x / week    Duration 12 weeks    Treatment/Interventions SLP instruction and feedback;Compensatory techniques;Functional tasks;Patient/family education;Compensatory strategies    Potential to Achieve Goals Fair    Potential Considerations Other (comment)   unknown etiology - await neurology consul   Consulted and Agree with Plan of Care Patient              Patient will benefit from skilled therapeutic intervention in order to improve the following deficits and impairments:   Other voice and resonance disorders  Dysarthria and anarthria  Dysphagia, unspecified type    Problem List Patient Active Problem List   Diagnosis Date Noted   Asymptomatic PVCs 06/02/2021   Essential hypertension 06/02/2021   Coronary artery disease involving native coronary artery of native heart without angina pectoris 06/02/2021   Atherosclerosis of aorta ([place]) 06/02/2021   Chronic obstructive pulmonary disease ([place]) 02/18/2021   Allergic rhinitis 02/06/2020   Glaucoma suspect 10/02/2019   PCP NOTES >>> 08/07/2015   Hypothyroidism 05/01/2015   Neuroma of foot 03/29/2015   Status post right foot surgery 01/22/2015   Plantar fasciitis of right foot 12/03/2014   Metatarsal deformity 12/03/2014   Equinus deformity of foot, acquired 12/03/2014   Bunion 12/03/2014   Annual physical exam 08/04/2012   Erectile dysfunction 08/04/2012   Type 2 diabetes mellitus with microalbuminuria, without long-term current use of insulin ([place]) 05/04/2012   Hyperlipidemia 05/04/2012   DJD (degenerative joint disease) 05/04/2012   Bradycardia 05/04/2012    Alinda Deem, MA CCC-SLP 09/04/2021, 1:13 PM  [facility] [address] [place], [place], [address] Phone: [phone]   Fax:  [phone]   Name: [name] [name] MRN: [id number] Date of Birth: 1951-08-05

## 2021-09-04 NOTE — Patient Instructions (Addendum)
Practicing humming through straw and into water. Think about your breath before and see if you can hold steady for a few seconds  Try blowing into a tissue with your voice on ("ooooo") and see if you can make the tissue blow away from you. Be easy and gentle when you do this. You should feel the vibrate on your lips and face  While eating/drinking, focus on small sips and taking a sip after eating to wash it down

## 2021-09-05 NOTE — Telephone Encounter (Signed)
I called [facility] and they stated NP appointments can be scheduled within the next few weeks.

## 2021-09-08 ENCOUNTER — Ambulatory Visit (HOSPITAL_COMMUNITY)
Admission: RE | Admit: 2021-09-08 | Discharge: 2021-09-08 | Disposition: A | Discharge status: Home / Self Care | Payer: Medicare Other | Source: Ambulatory Visit | Attending: Internal Medicine | Admitting: Internal Medicine

## 2021-09-08 ENCOUNTER — Ambulatory Visit: Payer: Medicare Other | Admitting: Speech Pathology

## 2021-09-08 ENCOUNTER — Other Ambulatory Visit: Payer: Self-pay

## 2021-09-08 ENCOUNTER — Encounter: Payer: Self-pay | Admitting: Speech Pathology

## 2021-09-08 DIAGNOSIS — R1310 Dysphagia, unspecified: Secondary | ICD-10-CM

## 2021-09-08 DIAGNOSIS — I739 Peripheral vascular disease, unspecified: Secondary | ICD-10-CM | POA: Insufficient documentation

## 2021-09-08 DIAGNOSIS — R498 Other voice and resonance disorders: Secondary | ICD-10-CM

## 2021-09-08 DIAGNOSIS — R471 Dysarthria and anarthria: Secondary | ICD-10-CM

## 2021-09-08 DIAGNOSIS — R490 Dysphonia: Secondary | ICD-10-CM | POA: Insufficient documentation

## 2021-09-08 DIAGNOSIS — R479 Unspecified speech disturbances: Secondary | ICD-10-CM

## 2021-09-08 NOTE — Patient Instructions (Addendum)
    SLOW LOUD OVER-ENNUNCIATE PAUSE  If [name] [name] can come 1x,  that would be good    SLOW AND BIG - EXAGGERATE YOUR MOUTH, MAKE EACH CONSONANT  When you are tired, take more frequent breaths and say  a few words on each breath rather than trying to push out a lot of words on 1 breath    When you are tired and need to talk:  Get the persons attention before you speak  Use eye contact and face the person you are speaking to  Be in close proximity to the person you are speaking to  Turn down any noise in the environment such as the TV, walk away from loud appliances, air conditioners, fans, dish washers etc  In large gatherings, sit or stay on the side not the center of the room  Try to sit with a wall behind you or in a corner so noise isn't coming at you from all directions when dining out or attending gatherings   She sells sea shells  See shoe's shoes  Fifty-Fifty  Hit the hammer  High school hero  Hocus Pocus  To each his own  Zebra's zig zag at the zoo  [employer] chews cheddar cheese  Choosy moms choose [facility]  Fat Freddy prefers french fries  Vince vowed to vote  Feel the furry   She should polish her shoes  Show them the fresh fruit  Teachers eat ripe peaches at [employer]  Not now nor never  My mama makes me muffins  No one knows Norman's nickname  See Gay Filler Sleep soundly by the sea

## 2021-09-08 NOTE — Therapy (Signed)
[facility] [address] [place], [place], [address] Phone: [phone]   Fax:  [phone]  Speech Language Pathology Treatment  Patient Details  Name: [name] [name] MRN: [id number] Date of Birth: September 19, 1951 Referring Provider (SLP): Rozetta Nunnery, MD (PCP/doc - Colon Branch MD)   Encounter Date: 09/08/2021   End of Session - 09/08/21 1604     Visit Number 4    Number of Visits 25    Date for SLP Re-Evaluation 11/21/21    Authorization Type UHC Medicare    SLP Start Time 2    SLP Stop Time  1315    SLP Time Calculation (min) 45 min    Activity Tolerance Patient tolerated treatment well             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   Colon polyps    Colon polyps at age 8, next Cscope age 55   Diabetes mellitus    DJD (degenerative joint disease)    Glaucoma suspect    Hyperlipidemia     Past Surgical History:  Procedure Laterality Date   BUNIONECTOMY Right 12/27/2014   @[facility]   BUNIONECTOMY Left    2019   ESOPHAGEAL MANOMETRY N/A 07/23/2021   Procedure: ESOPHAGEAL MANOMETRY (EM);  Surgeon: Wilford Corner, MD;  Location: [facility];  Service: Endoscopy;  Laterality: N/A;   NASAL SEPTUM SURGERY  2000   SHOULDER ARTHROSCOPY     L 2011, R 2012    There were no vitals filed for this visit.   Subjective Assessment - 09/08/21 1236     Subjective "They are going to get [facility] to call"    Currently in Pain? No/denies                   ADULT SLP TREATMENT - 09/08/21 1239       General Information   Behavior/Cognition Alert;Cooperative;Pleasant mood      Treatment Provided   Treatment provided Cognitive-Linquistic      Cognitive-Linquistic Treatment   Treatment focused on Voice;Patient/family/caregiver education;Dysarthria    Skilled Treatment Pt reports slurred speech and dysphagia persist. He enters room with  slight slur and clear voice. Trained pt in breathing more frequently and speaking less words on 1 breath to conserve energy, especially at the end of the day when his speech and voice are declined. Used flow phonation phrases/sentences with lines drawn where he should breathe (every 3-5 words). With modeling and verbal cues, Serafin was able to demonstrate speaking less on one breathe reading these sentences. After 20 senteces, Emir's voice became more hoarse and his dysarthria worsened, indicating [place] is not a candidate for agressive speech exercises. Trained pt in environmental modifications to help improve intelliglbity with less effort      Assessment / Recommendations / Plan   Plan Continue with current plan of care              SLP Education - 09/08/21 1557     Education Details environemntal modifications to improve intelligilbity and ease of speech; speak fewer words on each breath to conserve energy    Person(s) Educated Patient    Methods Explanation;Demonstration;Verbal cues;Handout    Comprehension Verbalized understanding;Returned demonstration;Verbal cues required;Need further instruction              SLP Short Term Goals - 09/08/21 1603       SLP SHORT TERM GOAL #1   Title Pt  will complete voice exercise HEP given occasional min A over 3 sessions    Time 3    Period Weeks    Status On-going      SLP SHORT TERM GOAL #2   Title Pt will demonstrate increased awareness of throat clearing and use appropriate substitute with 80% accuracy over 3 sessions    Time 3    Period Weeks    Status On-going      SLP SHORT TERM GOAL #3   Title Pt will utilize abdominal breathing for 18/20 sentence responses given rare min A over 2 sessions    Time 3    Period Weeks    Status On-going      SLP SHORT TERM GOAL #4   Title Pt will demonstrate compensations to maximize speech intelligiblity in 5 minute conversation given occasional min A over 2 sessions    Time 3    Period  Weeks    Status On-going      SLP SHORT TERM GOAL #5   Title Pt will complete bedside swallow evaluation to determine need for instrumental swallow study (as needed)    Status Achieved              SLP Long Term Goals - 09/08/21 1603       SLP LONG TERM GOAL #1   Title Pt will complete voice exercise HEP given rare min A over 3 sessions    Time 11    Period Weeks    Status On-going      SLP LONG TERM GOAL #2   Title Pt will follow vocal hygiene protocol > 2 weeks given rare min A    Time 11    Period Weeks    Status On-going      SLP LONG TERM GOAL #3   Title Pt will demonstrate awareness of hoarse voice and utilize abdominal breathing with 90% accuracy over 2 sessions    Time 11    Period Weeks    Status On-going      SLP LONG TERM GOAL #4   Title Pt will demonstrate compensations to maximize speech intelligibility in 15+ minute conversation given occasional min A over 2 sessions    Time 11    Period Weeks    Status On-going      SLP LONG TERM GOAL #5   Title Pt will undergo objective swallow study to evaluate pharyngeal function and assess for risk of aspiration as needed    Time 11    Period Weeks    Status On-going              Plan - 09/08/21 1559     Clinical Impression Statement Taedyn continues to present with worsening dysarthria, dysphonia and dysphagia. Intelligiblity is affected. Initiated training in compensations for dysarthria and dysphonia, including environmental modifications and speaking less on each breath to conserve energy. Of note, pt's voice and speech worsened as session progressed. Coughing with each water sip noted. . Will continue to educate re:  MBSS if dysphagia progresses or pending neurological dx. Encourge him to call neurology weekly and check on the wait list. as his appointment isn't until Feb 2023. Also requested his spouse attend a session for education on how to ease communication for [facility].  Continue skilled ST to maximize  intelligibility, safety of swallow and optimal voice quality for safety, QOL    Speech Therapy Frequency 2x / week    Duration 12 weeks    Treatment/Interventions SLP  instruction and feedback;Compensatory techniques;Functional tasks;Patient/family education;Compensatory strategies    Potential to Achieve Goals Fair    Potential Considerations Other (comment)   unknown etiology - await neuro consult   Consulted and Agree with Plan of Care Patient             Patient will benefit from skilled therapeutic intervention in order to improve the following deficits and impairments:   Dysarthria and anarthria  Dysphagia, unspecified type  Other voice and resonance disorders    Problem List Patient Active Problem List   Diagnosis Date Noted   Asymptomatic PVCs 06/02/2021   Essential hypertension 06/02/2021   Coronary artery disease involving native coronary artery of native heart without angina pectoris 06/02/2021   Atherosclerosis of aorta ([place]) 06/02/2021   Chronic obstructive pulmonary disease ([place]) 02/18/2021   Allergic rhinitis 02/06/2020   Glaucoma suspect 10/02/2019   PCP NOTES >>> 08/07/2015   Hypothyroidism 05/01/2015   Neuroma of foot 03/29/2015   Status post right foot surgery 01/22/2015   Plantar fasciitis of right foot 12/03/2014   Metatarsal deformity 12/03/2014   Equinus deformity of foot, acquired 12/03/2014   Bunion 12/03/2014   Annual physical exam 08/04/2012   Erectile dysfunction 08/04/2012   Type 2 diabetes mellitus with microalbuminuria, without long-term current use of insulin ([place]) 05/04/2012   Hyperlipidemia 05/04/2012   DJD (degenerative joint disease) 05/04/2012   Bradycardia 05/04/2012    Lawrence Roldan, Annye Rusk MS, CCC-SLP 09/08/2021, 4:05 PM  [facility] [address] [place], [place], [address] Phone: [phone]   Fax:  [phone]   Name: AEDON DEASON MRN: [id number] Date of  Birth: 1951/02/14

## 2021-09-12 ENCOUNTER — Other Ambulatory Visit: Payer: Self-pay

## 2021-09-12 ENCOUNTER — Ambulatory Visit: Payer: Medicare Other

## 2021-09-12 ENCOUNTER — Encounter: Payer: Self-pay | Admitting: Internal Medicine

## 2021-09-12 DIAGNOSIS — R498 Other voice and resonance disorders: Secondary | ICD-10-CM

## 2021-09-12 DIAGNOSIS — J440 Chronic obstructive pulmonary disease with acute lower respiratory infection: Secondary | ICD-10-CM

## 2021-09-12 DIAGNOSIS — R471 Dysarthria and anarthria: Secondary | ICD-10-CM | POA: Diagnosis not present

## 2021-09-12 DIAGNOSIS — R1310 Dysphagia, unspecified: Secondary | ICD-10-CM

## 2021-09-12 DIAGNOSIS — J209 Acute bronchitis, unspecified: Secondary | ICD-10-CM

## 2021-09-12 NOTE — Patient Instructions (Signed)
   Signs of Aspiration Pneumonia   Chest pain/tightness Fever (can be low grade) Cough  With foul-smelling phlegm (sputum) With sputum containing pus or blood With greenish sputum Fatigue  Shortness of breath  Wheezing   **IF YOU HAVE THESE SIGNS, CONTACT YOUR DOCTOR OR GO TO THE EMERGENCY DEPARTMENT OR URGENT CARE AS SOON AS POSSIBLE**     

## 2021-09-12 NOTE — Therapy (Signed)
[facility] [address] [place], [place], [address] Phone: [phone]   Fax:  [phone]  Speech Language Pathology Treatment  Patient Details  Name: [name] [name] MRN: [id number] Date of Birth: 06/16/1951 Referring Provider (SLP): Rozetta Nunnery, MD (PCP/doc - Colon Branch MD)   Encounter Date: 09/12/2021   End of Session - 09/12/21 1359     Visit Number 5    Number of Visits 25    Date for SLP Re-Evaluation 11/21/21    Authorization Type [employer] Medicare    SLP Start Time 1318    SLP Stop Time  1356    SLP Time Calculation (min) 38 min    Activity Tolerance Patient tolerated treatment well             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   Colon polyps    Colon polyps at age 70, next Cscope age 72   Diabetes mellitus    DJD (degenerative joint disease)    Glaucoma suspect    Hyperlipidemia     Past Surgical History:  Procedure Laterality Date   BUNIONECTOMY Right 12/27/2014   @[facility]   BUNIONECTOMY Left    2019   ESOPHAGEAL MANOMETRY N/A 07/23/2021   Procedure: ESOPHAGEAL MANOMETRY (EM);  Surgeon: Wilford Corner, MD;  Location: [facility];  Service: Endoscopy;  Laterality: N/A;   NASAL SEPTUM SURGERY  2000   SHOULDER ARTHROSCOPY     L 2011, R 2012    There were no vitals filed for this visit.   Subjective Assessment - 09/12/21 1323     Subjective "We found that my talking got worse after a while."                   ADULT SLP TREATMENT - 09/12/21 1325       General Information   Behavior/Cognition Alert;Cooperative;Pleasant mood      Treatment Provided   Treatment provided Cognitive-Linquistic      Cognitive-Linquistic Treatment   Treatment focused on Voice;Dysarthria;Patient/family/caregiver education    Skilled Treatment Pt alone for ST. Pt enters today with what sounds like flaccid dysarthria, intermittent severity,  without hoarseness. Pt told SLP that previous session pt and SLP discussed details of previous session - pt recalled with good-excellent success. "I talk and after a while I get real hoarse." SLP encouraged pt to take a break when this occurs and pt stated he was doing so - that approx 1 hour worked to rest his voice but quality deteriorates rapidly. SLP suggested to pt that he wait longer than 60 minutes. Pt shared he is performing HEP until he is fatigued and then stops. This SLP also encouraged pt to do this.Today after 19 minutes of conversation with SLP pt's speech notably became more severely dysarthric, with some mild hoarseness. Pt told SLP tips from previous ST sessions (appropriately recalled). SLP provided pt with education re: s/sx aspiration PNA and pt taught back to SLP. Pt will not be seen again until 10-22-21 due to vacation. Pt was satisfied with this plan.      Assessment / Recommendations / Plan   Plan Continue with current plan of care      Progression Toward Goals   Progression toward goals Progressing toward goals              SLP Education - 09/12/21 1359     Education Details overt s/sx aspiration PNA, reiterated environmental modifications  Person(s) Educated Patient    Methods Explanation;Handout    Comprehension Verbalized understanding;Need further instruction              SLP Short Term Goals - 09/12/21 1401       SLP SHORT TERM GOAL #1   Title Pt will complete voice exercise HEP given occasional min A over 3 sessions    Time 3    Period Weeks    Status On-going      SLP SHORT TERM GOAL #2   Title Pt will demonstrate increased awareness of throat clearing and use appropriate substitute with 80% accuracy over 3 sessions    Time 3    Period Weeks    Status On-going      SLP SHORT TERM GOAL #3   Title Pt will utilize abdominal breathing for 18/20 sentence responses given rare min A over 2 sessions    Time 3    Period Weeks    Status On-going       SLP SHORT TERM GOAL #4   Title Pt will demonstrate compensations to maximize speech intelligiblity in 5 minute conversation given occasional min A over 2 sessions    Time 3    Period Weeks    Status On-going      SLP SHORT TERM GOAL #5   Title Pt will complete bedside swallow evaluation to determine need for instrumental swallow study (as needed)    Status Achieved              SLP Long Term Goals - 09/12/21 1402       SLP LONG TERM GOAL #1   Title Pt will complete voice exercise HEP given rare min A over 3 sessions    Time 11    Period Weeks    Status On-going      SLP LONG TERM GOAL #2   Title Pt will follow vocal hygiene protocol > 2 weeks given rare min A    Time 11    Period Weeks    Status On-going      SLP LONG TERM GOAL #3   Title Pt will demonstrate awareness of hoarse voice and utilize abdominal breathing with 90% accuracy over 2 sessions    Time 11    Period Weeks    Status On-going      SLP LONG TERM GOAL #4   Title Pt will demonstrate compensations to maximize speech intelligibility in 15+ minute conversation given occasional min A over 2 sessions    Time 11    Period Weeks    Status On-going      SLP LONG TERM GOAL #5   Title Pt will undergo objective swallow study to evaluate pharyngeal function and assess for risk of aspiration as needed    Time 11    Period Weeks    Status On-going              Plan - 09/12/21 1400     Clinical Impression Statement Ziaire continues to present with worsening dysarthria, dysphonia and dysphagia. Intelligiblity is affected. Continued training in compensations for dysarthria and dysphonia, including environmental modifications and speaking less on each breath to conserve energy. Of note, pt's voice and speech worsened after 19 minutes of ST today. Will continue to educate re:  MBSS if dysphagia progresses or pending neurological dx. Encourge him to call neurology weekly and check on the wait list. as his  appointment isn't until Feb 2023. SLP provided pt with a list  of overt s/sx aspiration PNA. Continue skilled ST to maximize intelligibility, safety of swallow and optimal voice quality for safety, QOL    Speech Therapy Frequency 2x / week    Duration 12 weeks    Treatment/Interventions SLP instruction and feedback;Compensatory techniques;Functional tasks;Patient/family education;Compensatory strategies    Potential to Achieve Goals Fair    Potential Considerations Other (comment)   unknown etiology - await neuro consult   Consulted and Agree with Plan of Care Patient             Patient will benefit from skilled therapeutic intervention in order to improve the following deficits and impairments:   Dysarthria and anarthria  Dysphagia, unspecified type  Other voice and resonance disorders    Problem List Patient Active Problem List   Diagnosis Date Noted   Asymptomatic PVCs 06/02/2021   Essential hypertension 06/02/2021   Coronary artery disease involving native coronary artery of native heart without angina pectoris 06/02/2021   Atherosclerosis of aorta ([place]) 06/02/2021   Chronic obstructive pulmonary disease ([place]) 02/18/2021   Allergic rhinitis 02/06/2020   Glaucoma suspect 10/02/2019   PCP NOTES >>> 08/07/2015   Hypothyroidism 05/01/2015   Neuroma of foot 03/29/2015   Status post right foot surgery 01/22/2015   Plantar fasciitis of right foot 12/03/2014   Metatarsal deformity 12/03/2014   Equinus deformity of foot, acquired 12/03/2014   Bunion 12/03/2014   Annual physical exam 08/04/2012   Erectile dysfunction 08/04/2012   Type 2 diabetes mellitus with microalbuminuria, without long-term current use of insulin ([place]) 05/04/2012   Hyperlipidemia 05/04/2012   DJD (degenerative joint disease) 05/04/2012   Bradycardia 05/04/2012    [place] ,[place], CCC-SLP  09/12/2021, 2:02 PM  [facility] [address] [place], [place], [address] Phone: [phone]   Fax:  [phone]   Name: JAFETH MUSTIN MRN: [id number] Date of Birth: 1951/02/05

## 2021-09-17 ENCOUNTER — Other Ambulatory Visit: Payer: Self-pay | Admitting: Internal Medicine

## 2021-10-08 DIAGNOSIS — K219 Gastro-esophageal reflux disease without esophagitis: Secondary | ICD-10-CM | POA: Insufficient documentation

## 2021-10-08 DIAGNOSIS — K2210 Ulcer of esophagus without bleeding: Secondary | ICD-10-CM | POA: Insufficient documentation

## 2021-10-14 ENCOUNTER — Encounter: Payer: Self-pay | Admitting: Neurology

## 2021-10-21 NOTE — Progress Notes (Addendum)
NEUROLOGY CONSULTATION NOTE  [name] MRN: [id number] DOB: December 16, 1950  Referring provider: Kathlene November, MD Primary care provider: Kathlene November, MD  Reason for consult:  speech difficulty, dysphagia  Assessment/Plan:   1  Subacute onset of dysphonia, dysphagia, dysarthria followed by dyspnea, weakness of extremities and muscle cramps.  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   We will order NCV-EMG with ALS protocol In meantime, we will perform urgent tests looking for other potential etiologies: MRI brain/cervical spine with and without contrast Check labs:  SPEP/UPEP, IFE, ceruloplasmin, copper, zinc, HTLV-1, B12, GM 1 antibodies, PTH, TSH, CK and Lyme. Follow up right after tests completed.   Subjective:  [name] [name] is a 70 year old male with DM II and HLD who presents for speech difficulty and dysphagia.  History supplemented by ENT and referring provider's note.  Several months ago, he began experiencing hoarseness and trouble clearing his throat.  He then started having trouble swallowing, such as choking on rice or peanuts.  He had an upper GI endoscopy which demonstrated an ulcer and was started on omeprazole.  However symptoms persisted.  He had a modified barium swallow in June which demonstrated small hiatal hernia with trace GERD but no laryngeal penetration, esophageal strictures or other abnormalities of the esophagus.  He was evaluated by ENT.  Fiberoptic laryngoscopy in September was negative.  The otolaryngologist thought that the dysphagia and hoarseness may be related to GERD.  However, in September, he began noticing left lower extremity weakness.  He felt cramping in his left calf and had trouble moving/picking up his left foot.  He had trouble lifting his leg in order to put on pants.  He reports having vascular studies performed which were unremarkable.  He feels off-balance.  He began experiencing left hand weakness as well.  More recently,  he has started noticing some trace weakness in his right hand.  No radicular pain or numbness of the extremities, although he does have DM II.  He started feeling short of breath and is scheduled for pulmonary function test later today.  Denies double vision or change in bowel/bladder function.   PAST MEDICAL HISTORY: 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   Colon polyps    Colon polyps at age 64, next Cscope age 72   Diabetes mellitus    DJD (degenerative joint disease)    Glaucoma suspect    Hyperlipidemia     PAST SURGICAL HISTORY: Past Surgical History:  Procedure Laterality Date   BUNIONECTOMY Right 12/27/2014   [facility]   BUNIONECTOMY Left    2019   ESOPHAGEAL MANOMETRY N/A 07/23/2021   Procedure: ESOPHAGEAL MANOMETRY (EM);  Surgeon: Wilford Corner, MD;  Location: [facility];  Service: Endoscopy;  Laterality: N/A;   NASAL SEPTUM SURGERY  2000   SHOULDER ARTHROSCOPY     L 2011, R 2012    MEDICATIONS: Current Outpatient Medications on File Prior to Visit  Medication Sig Dispense Refill   Accu-Chek Softclix Lancets lancets Use as instructed 100 each 12   aspirin 81 MG tablet Take 81 mg by mouth daily.     atorvastatin (LIPITOR) 40 MG tablet TAKE 1 TABLET BY MOUTH  DAILY 90 tablet 1   azelastine (ASTELIN) 0.1 % nasal spray Place 2 sprays into both nostrils at bedtime. 30 mL 6   Blood Glucose Monitoring Suppl (ACCU-CHEK AVIVA PLUS) w/Device KIT Use to check blood sugar 1 kit 0   empagliflozin (  JARDIANCE) 10 MG TABS tablet Take 1 tablet (10 mg total) by mouth daily before breakfast. 30 tablet 6   fish oil-omega-3 fatty acids 1000 MG capsule Take 2 g by mouth daily.     fluticasone (FLONASE) 50 MCG/ACT nasal spray Place into both nostrils daily.     glucose blood (ACCU-CHEK AVIVA) test strip Check blood sugar no more than twice daily 100 each 12   levothyroxine (SYNTHROID) 88 MCG tablet TAKE 1 TABLET BY MOUTH  DAILY BEFORE BREAKFAST 90  tablet 1   lisinopril (ZESTRIL) 2.5 MG tablet TAKE 1 TABLET BY MOUTH  DAILY 90 tablet 1   Melatonin 1 MG TABS Take 1 mg by mouth daily.     metFORMIN (GLUCOPHAGE-XR) 500 MG 24 hr tablet TAKE 2 TABLETS BY MOUTH  TWICE DAILY 360 tablet 1   metoprolol tartrate (LOPRESSOR) 25 MG tablet TAKE 1 TABLET(25 MG) BY MOUTH TWICE DAILY 180 tablet 1   Multiple Vitamin (MULTIVITAMIN) tablet Take 1 tablet by mouth daily.     mupirocin cream (BACTROBAN) 2 % Apply 1 application topically as needed. Reported on 01/27/2016     omeprazole (PRILOSEC) 40 MG capsule SMARTSIG:1 Capsule(s) By Mouth Morning-Evening     sildenafil (REVATIO) 20 MG tablet TAKE 2 TO 3 TABLETS BY MOUTH ONCE DAILY AS NEEDED 50 tablet 2   triamcinolone cream (KENALOG) 0.1 %   2   No current facility-administered medications on file prior to visit.    ALLERGIES: No Known Allergies  FAMILY HISTORY: Family History  Problem Relation Age of Onset   Arthritis Mother    Alzheimer's disease Mother    Lung cancer Father    Stroke Other    Diabetes Other        uncle, brother (borderline)   GER disease Sister    Colon cancer Neg Hx    Prostate cancer Neg Hx    CAD Neg Hx     Objective:  Blood pressure (!) 151/63, pulse (!) 49, height '6\' 4"'  (1.93 m), weight 254 lb 3.2 oz (115.3 kg), SpO2 100 %. General: No acute distress.  Patient appears well-groomed.   Head:  Normocephalic/atraumatic Eyes:  fundi examined but not visualized Neck: supple, no paraspinal tenderness, full range of motion Back: No paraspinal tenderness Heart: regular rate and rhythm Lungs: Clear to auscultation bilaterally. Vascular: No carotid bruits. Neurological Exam: Mental status: alert and oriented to person, place, and time, recent and remote memory intact, fund of knowledge intact, attention and concentration intact, speech fluent but slow and dysarthric,  language intact. Cranial nerves: CN I: not tested CN II: pupils equal, round and reactive to light, visual  fields intact CN III, IV, VI:  full range of motion, no nystagmus, no ptosis CN V: facial sensation intact. CN VII: upper and lower face symmetric CN VIII: hearing intact CN IX, X: gag intact, uvula midline CN XI: sternocleidomastoid and trapezius muscles intact CN XII: tongue midline Bulk & Tone: normal, mild fasciculations in left thigh Motor:  muscle strength 5-/5 left hip flexion, 4+/5 left ankle dorsiflexion, 2-3/5 left EHL (but reports secondary to foot surgery).  Otherwise, 5/5 throughout. Sensation:  Pinprick, temperature and vibratory sensation intact. Deep Tendon Reflexes:  3+ throughout,  toes downgoing, possible left Hoffman's Finger to nose testing:  Without dysmetria.   Heel to shin:  Without dysmetria.   Gait:  Slight left limp.  About to turn.  Difficulty tandem walk, walking on toes and let heel. Romberg negative.    Thank you for  allowing me to take part in the care of this patient.  Metta Clines, DO  CC: Kathlene November, MD

## 2021-10-22 ENCOUNTER — Ambulatory Visit (INDEPENDENT_AMBULATORY_CARE_PROVIDER_SITE_OTHER): Payer: Medicare Other | Admitting: Internal Medicine

## 2021-10-22 ENCOUNTER — Ambulatory Visit (HOSPITAL_COMMUNITY)
Admission: RE | Admit: 2021-10-22 | Discharge: 2021-10-22 | Disposition: A | Discharge status: Home / Self Care | Payer: Medicare Other | Source: Ambulatory Visit | Attending: Neurology | Admitting: Neurology

## 2021-10-22 ENCOUNTER — Other Ambulatory Visit: Payer: Self-pay

## 2021-10-22 ENCOUNTER — Ambulatory Visit: Payer: Medicare Other | Admitting: Speech Pathology

## 2021-10-22 ENCOUNTER — Encounter: Payer: Self-pay | Admitting: Neurology

## 2021-10-22 ENCOUNTER — Ambulatory Visit: Payer: Medicare Other | Admitting: Neurology

## 2021-10-22 VITALS — BP 151/63 | HR 49 | Ht 76.0 in | Wt 254.2 lb

## 2021-10-22 DIAGNOSIS — R531 Weakness: Secondary | ICD-10-CM

## 2021-10-22 DIAGNOSIS — J209 Acute bronchitis, unspecified: Secondary | ICD-10-CM | POA: Diagnosis not present

## 2021-10-22 DIAGNOSIS — J440 Chronic obstructive pulmonary disease with acute lower respiratory infection: Secondary | ICD-10-CM

## 2021-10-22 DIAGNOSIS — R252 Cramp and spasm: Secondary | ICD-10-CM

## 2021-10-22 DIAGNOSIS — R471 Dysarthria and anarthria: Secondary | ICD-10-CM | POA: Insufficient documentation

## 2021-10-22 DIAGNOSIS — R1310 Dysphagia, unspecified: Secondary | ICD-10-CM | POA: Insufficient documentation

## 2021-10-22 LAB — PULMONARY FUNCTION TEST
DL/VA % pred: 138 %
DL/VA: 5.51 ml/min/mmHg/L
DLCO cor % pred: 87 %
DLCO cor: 27.31 ml/min/mmHg
DLCO unc % pred: 87 %
DLCO unc: 27.31 ml/min/mmHg
FEF 25-75 Post: 2.64 L/sec
FEF 25-75 Pre: 1.24 L/sec
FEF2575-%Change-Post: 111 %
FEF2575-%Pred-Post: 84 %
FEF2575-%Pred-Pre: 40 %
FEV1-%Change-Post: 36 %
FEV1-%Pred-Post: 70 %
FEV1-%Pred-Pre: 51 %
FEV1-Post: 2.89 L
FEV1-Pre: 2.12 L
FEV1FVC-%Change-Post: 27 %
FEV1FVC-%Pred-Pre: 78 %
FEV6-%Change-Post: 7 %
FEV6-%Pred-Post: 71 %
FEV6-%Pred-Pre: 67 %
FEV6-Post: 3.77 L
FEV6-Pre: 3.51 L
FEV6FVC-%Change-Post: 0 %
FEV6FVC-%Pred-Post: 102 %
FEV6FVC-%Pred-Pre: 101 %
FVC-%Change-Post: 6 %
FVC-%Pred-Post: 70 %
FVC-%Pred-Pre: 66 %
FVC-Post: 3.89 L
FVC-Pre: 3.65 L
Post FEV1/FVC ratio: 74 %
Post FEV6/FVC ratio: 97 %
Pre FEV1/FVC ratio: 58 %
Pre FEV6/FVC Ratio: 96 %
RV % pred: 159 %
RV: 4.39 L
TLC % pred: 94 %
TLC: 7.78 L

## 2021-10-22 IMAGING — MR MR HEAD WO/W CM
4 of 13 series · 12 of 48 positions shown · IV contrast (Yes GAD)
Comparison: None.

CLINICAL DATA: Dysarthria, speech issues, choking and left-sided
weakness

EXAM:
MRI HEAD WITHOUT AND WITH CONTRAST
TECHNIQUE: Multiplanar, multiecho pulse sequences of the brain and surrounding
structures were obtained without and with intravenous contrast.
CONTRAST:  10mL GADAVIST GADOBUTROL 1 MMOL/ML IV SOLN

[Series 2: DWI · axial · 3.0mm · 0.94mm/px · z∈[-57,+74]mm · 5 of 107 slices shown]
[im 1/107]
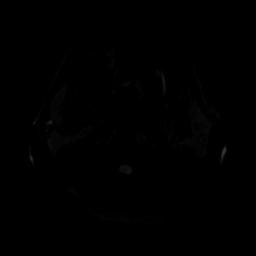
[im 18/107]
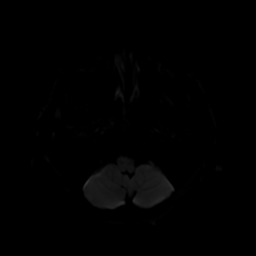
[im 36/107]
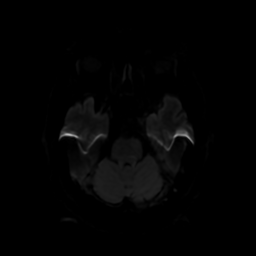
[im 54/107]
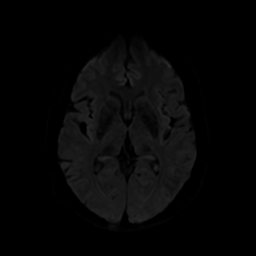
[im 89/107]
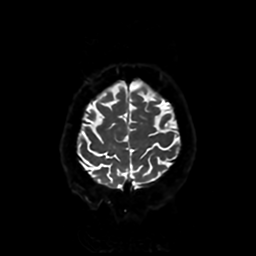

[Series 4: FLAIR · sagittal · 5.0mm · 0.23mm/px · 2 of 25 slices shown (1 of 3)]
[im 1/25]
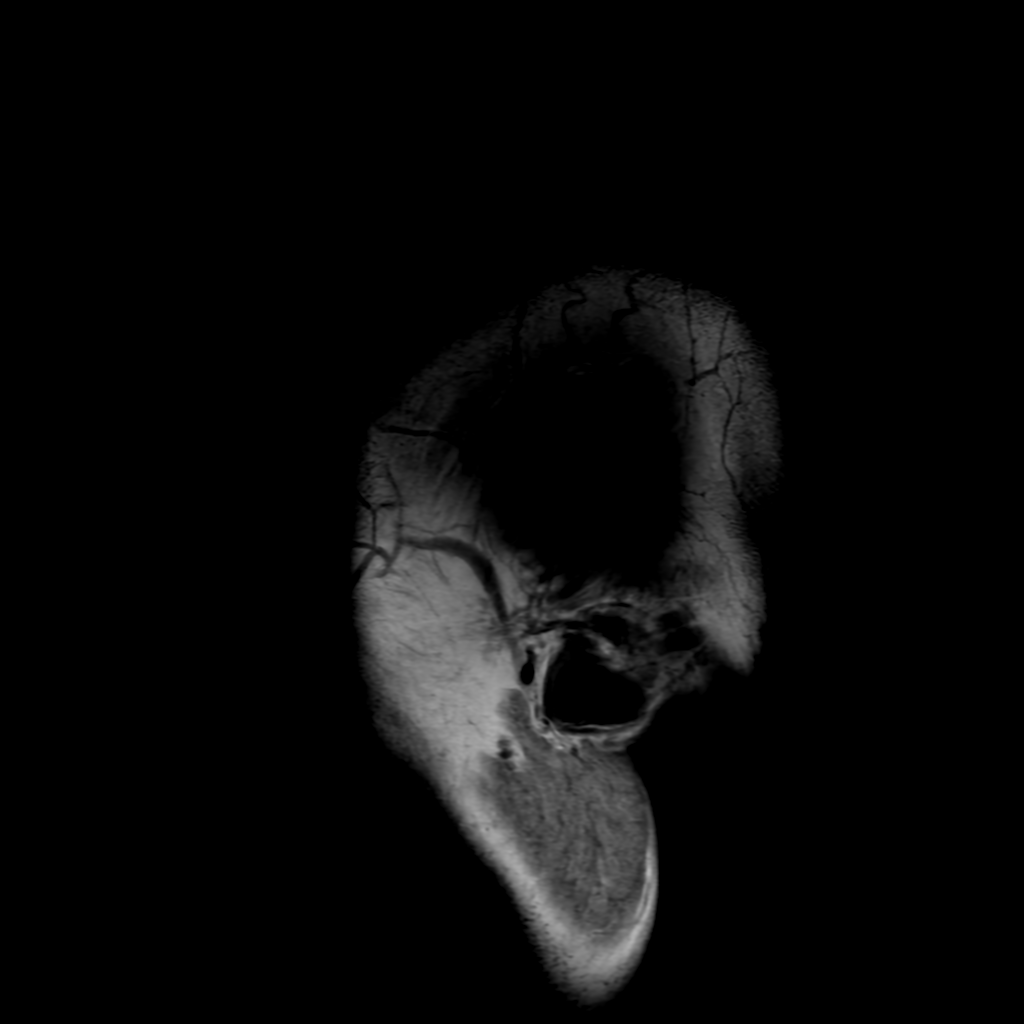
[im 25/25]
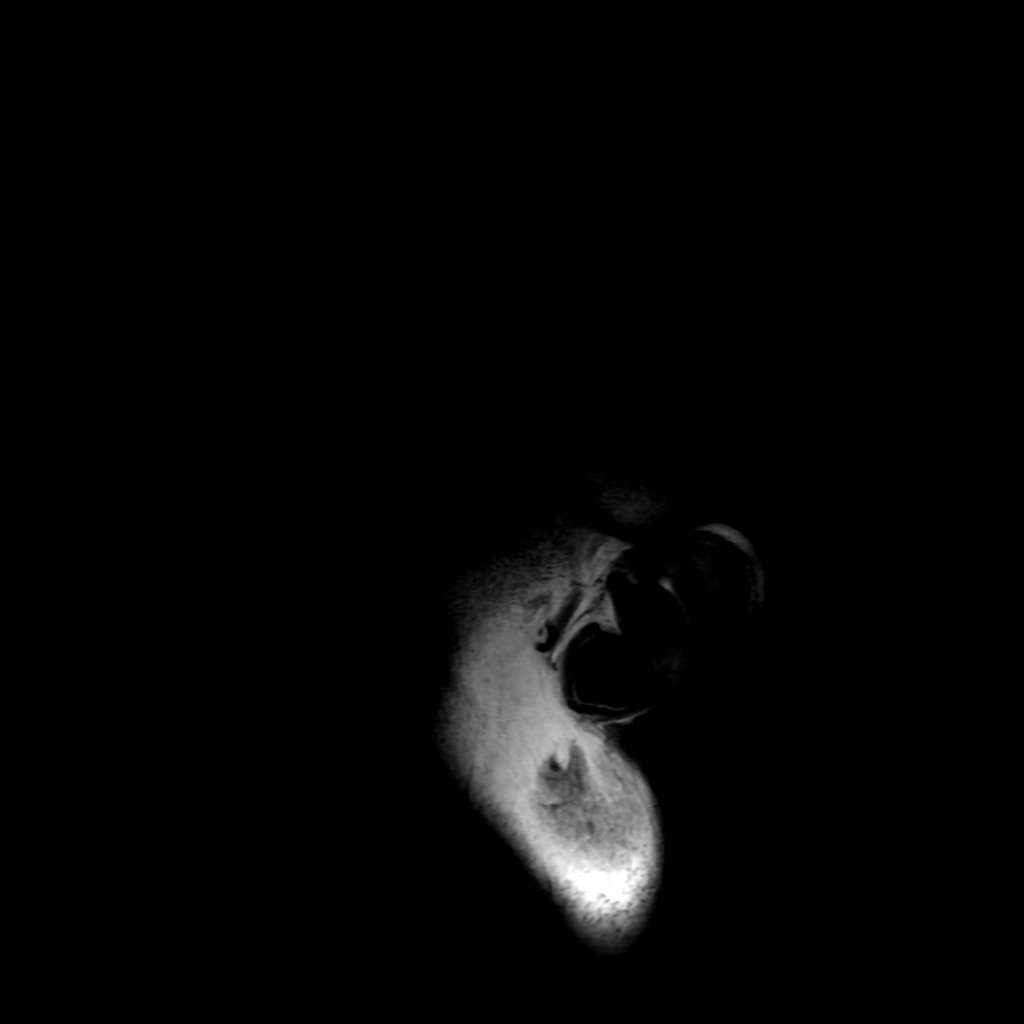

[Series 6: FLAIR · axial · 4.0mm · 0.45mm/px · z∈[-50,+99]mm · 3 of 35 slices shown (2 of 3)]
[im 1/35]
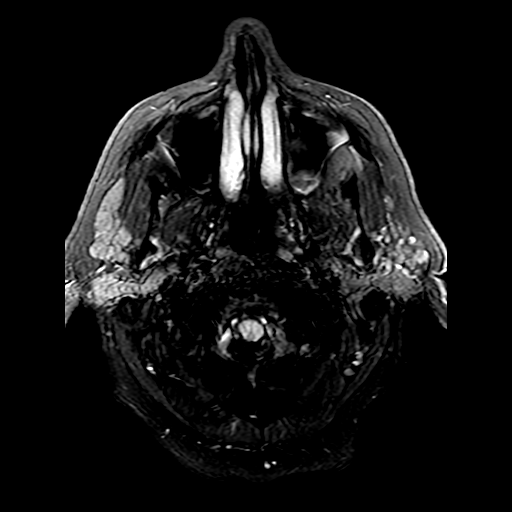
[im 18/35]
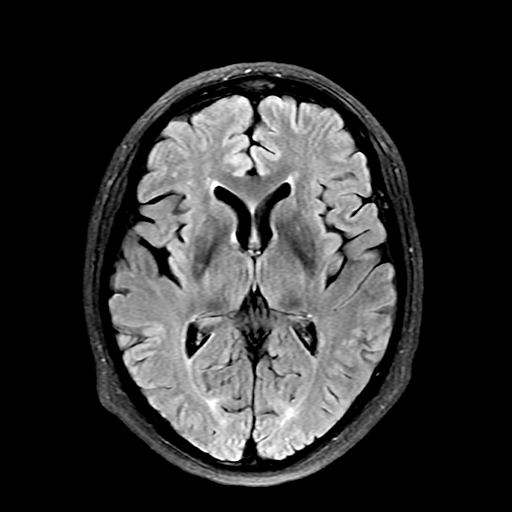
[im 35/35]
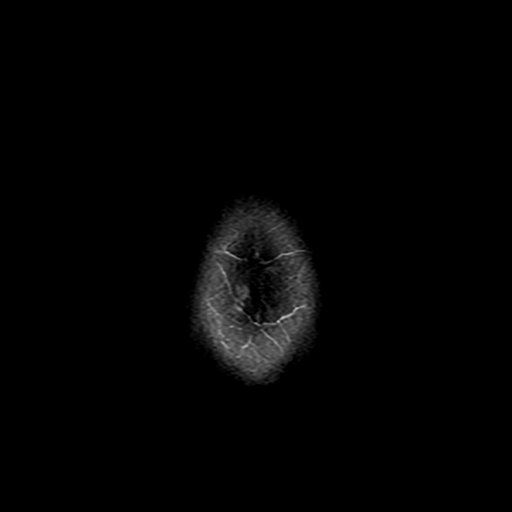

[Series 6: FLAIR · sagittal · 5.0mm · 0.23mm/px · 2 of 26 slices shown (3 of 3)]
[im 1/26]
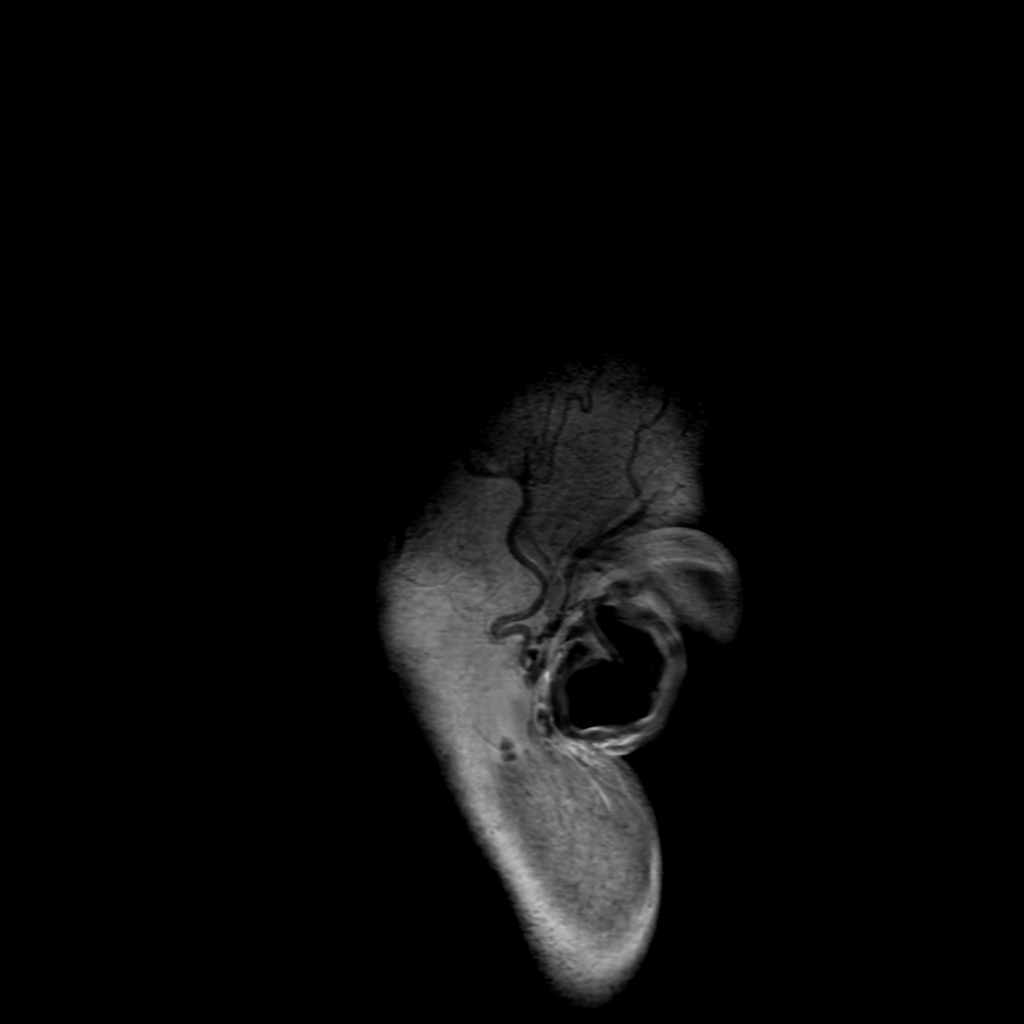
[im 26/26]
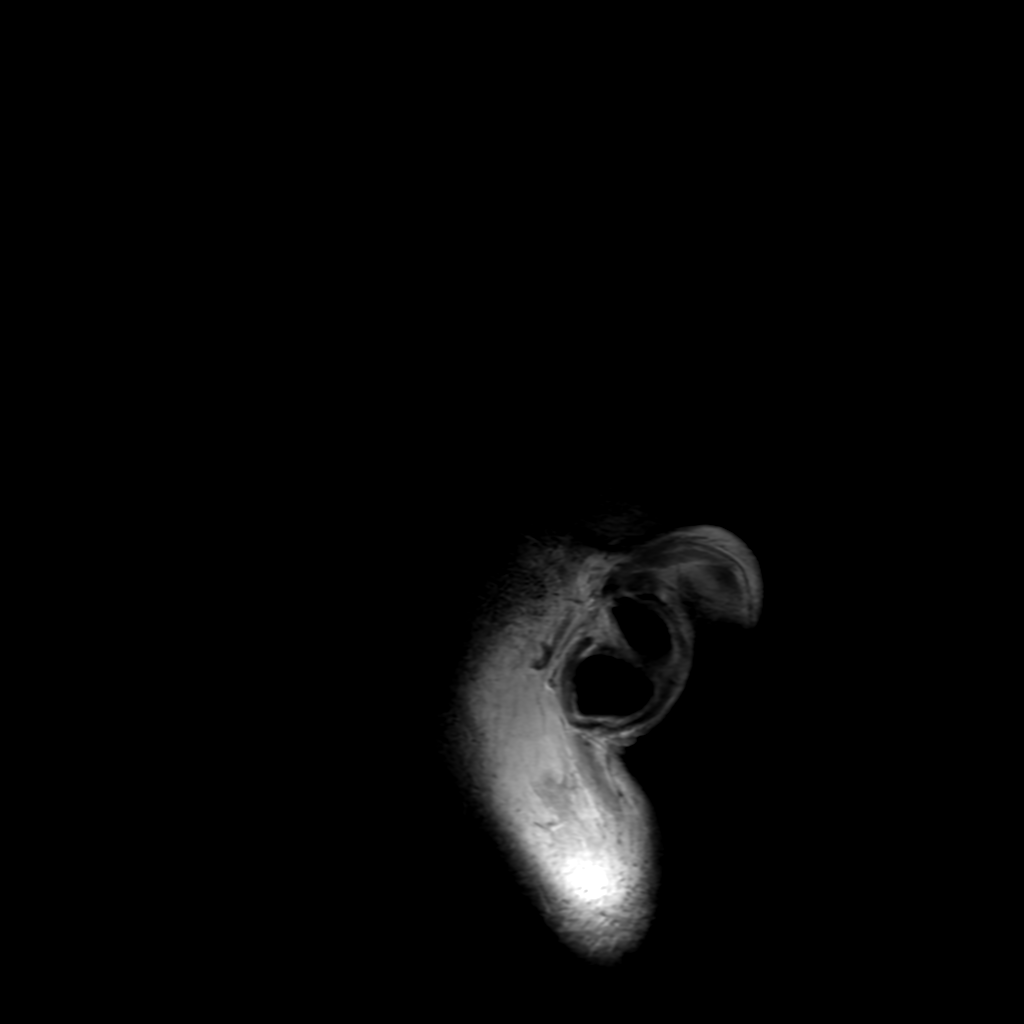

[12 of 48 positions shown; findings below may reference images not displayed]

FINDINGS: Brain: Dural-based, enhancing mass on the floor of the right middle
cranial fossa, projecting into the right temporal lobe and measuring
1.9 x 1.8 x 1.4 cm (series 4, image 19 and series 5, image 14), most
likely a meningioma. No significant increased T2 signal in the
surrounding parenchyma.

No restricted diffusion to suggest acute or subacute infarct. No
acute hemorrhage, mass effect, or midline shift. No foci of
hemosiderin deposition to suggest remote hemorrhage.

Vascular: Normal flow voids.

Skull and upper cervical spine: Normal marrow signal.

Sinuses/Orbits: Mucosal thickening in the paranasal sinuses. The
orbits are unremarkable.

Other: The mastoids are well aerated.
IMPRESSION: 1. Enhancing, dural-based mass on the floor of the right middle
cranial fossa, projecting into the right temporal lobe, most likely
a meningioma.

2. No acute or subacute infarct or other acute intracranial process.

## 2021-10-22 IMAGING — MR MR THORACIC SPINE WO/W CM
5 of 10 series · 19 of 48 positions shown · IV contrast (gadavist)
Comparison: None.

CLINICAL DATA: Left-sided weakness.

EXAM:
MRI THORACIC WITHOUT AND WITH CONTRAST
TECHNIQUE: Multiplanar and multiecho pulse sequences of the thoracic spine were
obtained without and with intravenous contrast.
CONTRAST:  10mL GADAVIST GADOBUTROL 1 MMOL/ML IV SOLN

[Series 2: T1 · sagittal · 3.0mm · 0.90mm/px · 1 of 12 slices shown (1 of 3)]
[im 1/12]
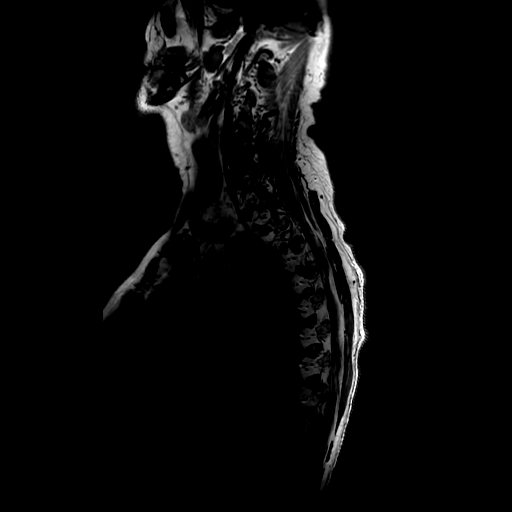

[Series 3: T2 · sagittal · 3.0mm · 0.66mm/px · 2 of 20 slices shown (1 of 2)]
[im 1/20]
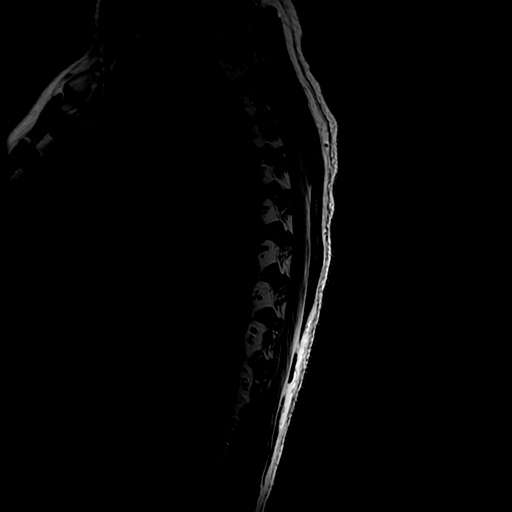
[im 20/20]
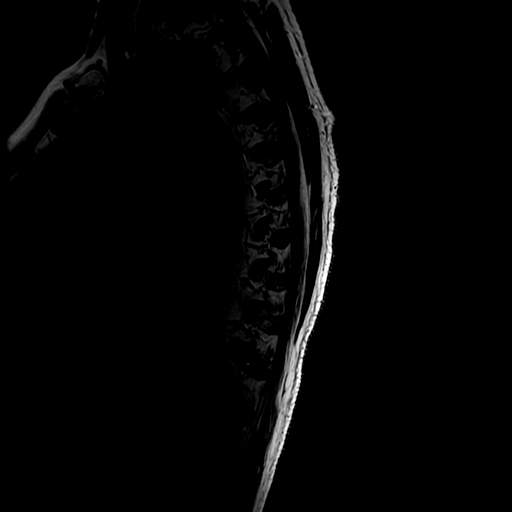

[Series 5: T1 · sagittal · 3.0mm · 0.66mm/px · 3 of 20 slices shown (2 of 3)]
[im 1/20]
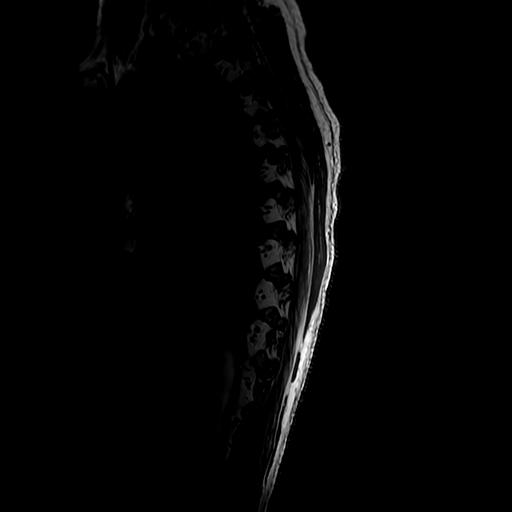
[im 10/20]
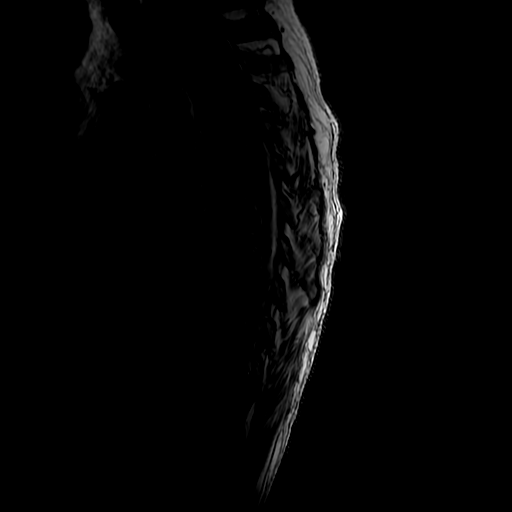
[im 20/20]
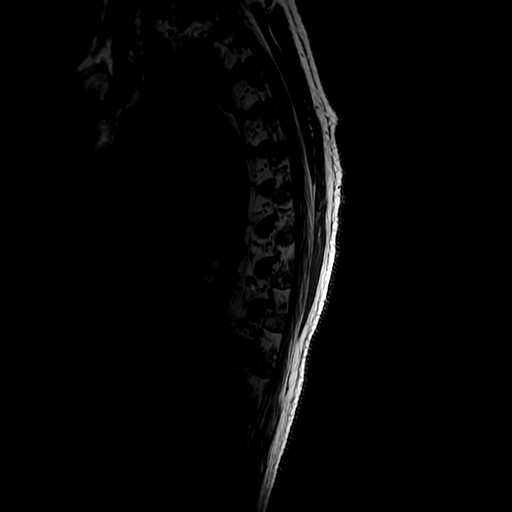

[Series 6: T2 · axial · 4.0mm · 0.43mm/px · z∈[-507,-192]mm · 8 of 60 slices shown (2 of 2)]
[im 1/60]
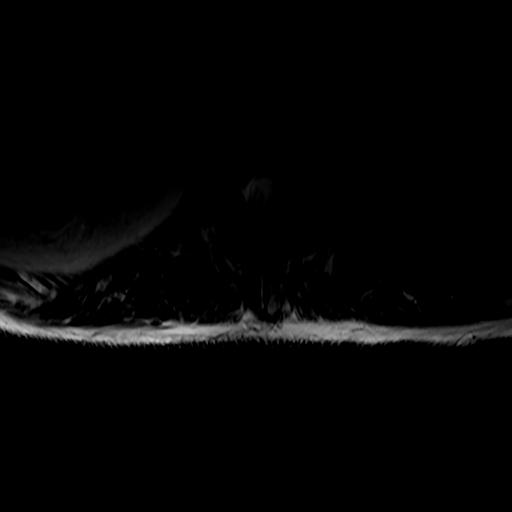
[im 9/60]
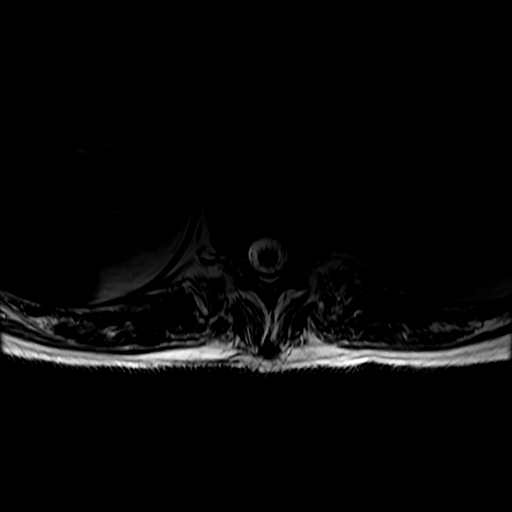
[im 17/60]
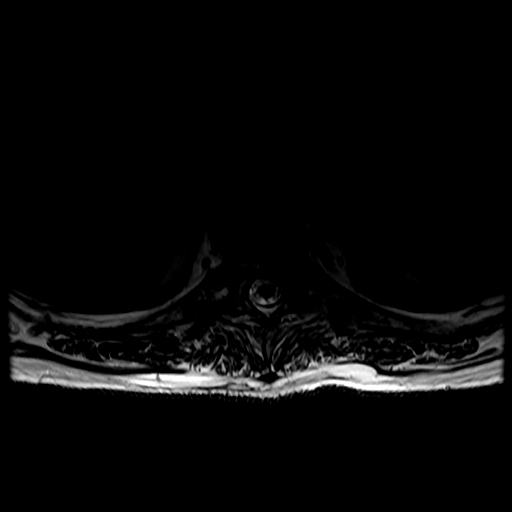
[im 26/60]
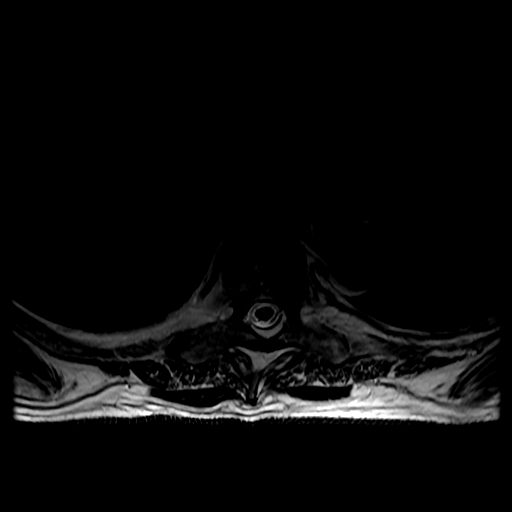
[im 34/60]
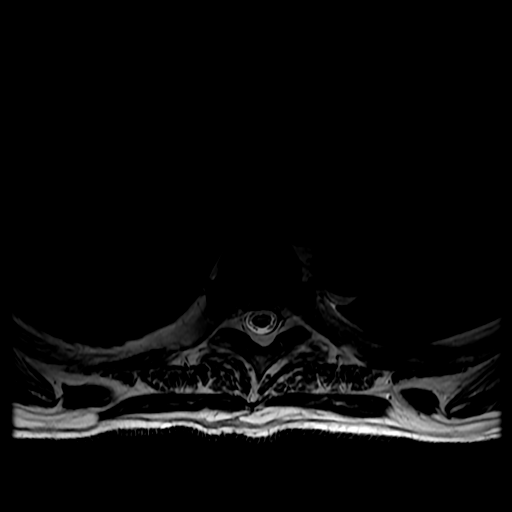
[im 43/60]
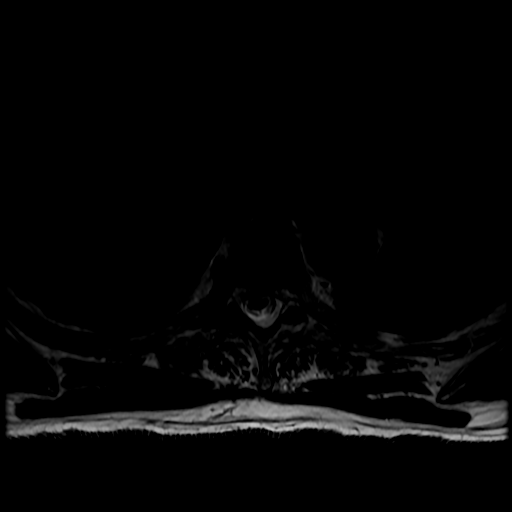
[im 51/60]
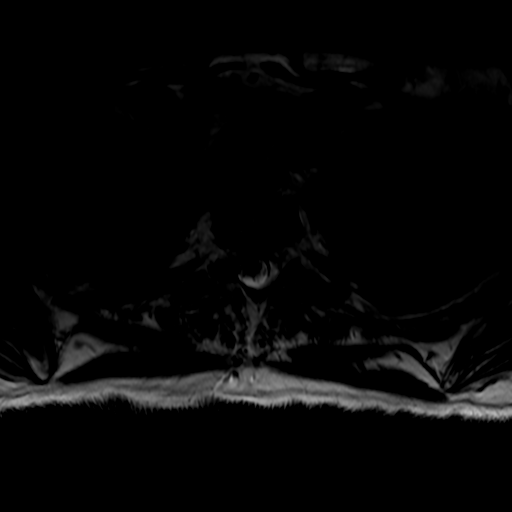
[im 60/60]
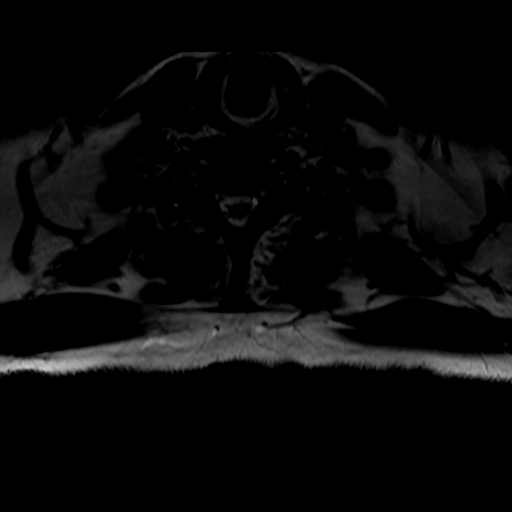

[Series 8: T1 · axial · non-contrast · 4.0mm · 0.43mm/px · z∈[-507,-331]mm · 5 of 60 slices shown (3 of 3)]
[im 1/60]
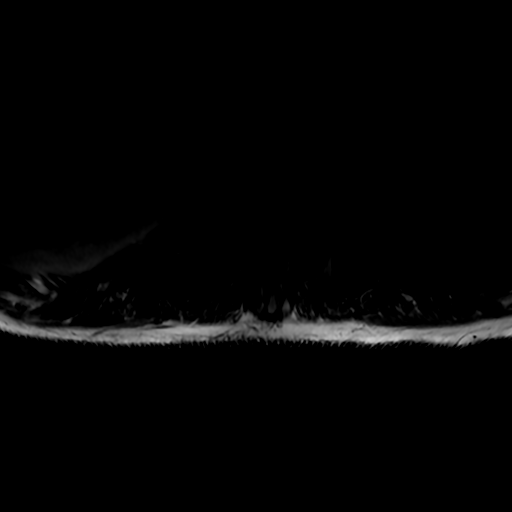
[im 9/60]
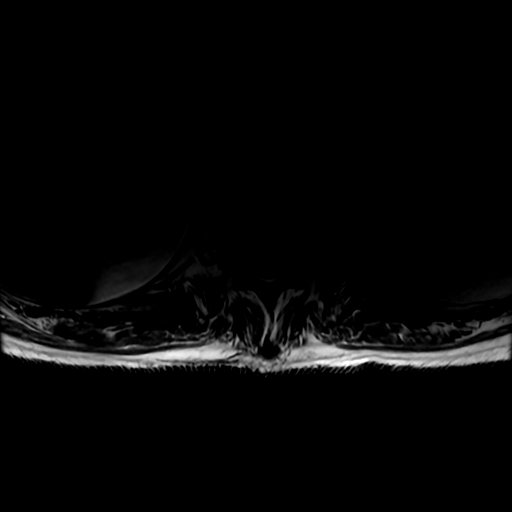
[im 17/60]
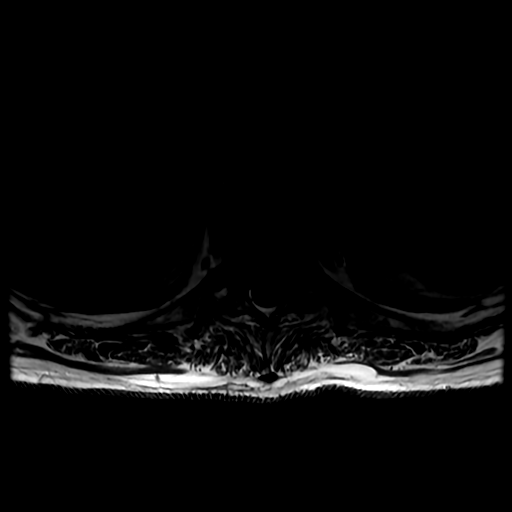
[im 26/60]
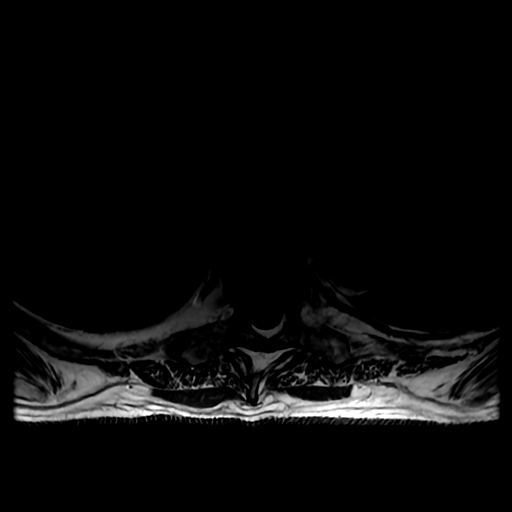
[im 34/60]
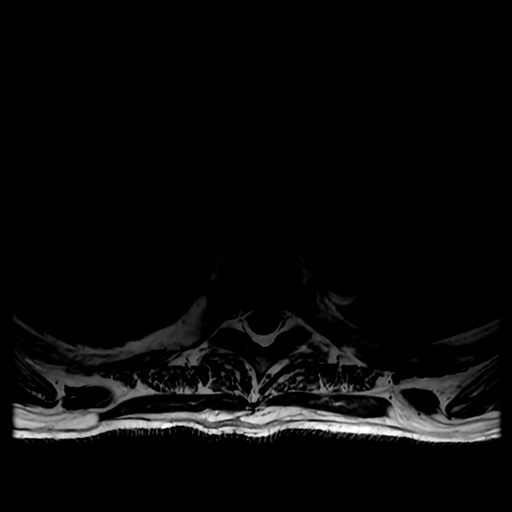

[19 of 48 positions shown; findings below may reference images not displayed]

FINDINGS: Alignment:  Normal

Vertebrae: Normal marrow signal. No bone lesions or fractures. No
areas of abnormal contrast enhancement.

Cord: Normal cord signal intensity. No cord lesions or syrinx. No
areas of abnormal cord enhancement.

Paraspinal and other soft tissues: No significant paraspinal or
posterior lung findings. The aorta is normal in caliber. No obvious
mediastinal process.

Disc levels:

No thoracic disc protrusions, spinal or foraminal stenosis.
IMPRESSION: Unremarkable thoracic spine MRI examination.

## 2021-10-22 MED ORDER — GADOBUTROL 1 MMOL/ML IV SOLN
10.0000 mL | Freq: Once | INTRAVENOUS | Status: AC | PRN
  Administered 2021-10-22: 18:00:00 10 mL via INTRAVENOUS

## 2021-10-22 NOTE — Progress Notes (Signed)
Full PFT completed today ? ?

## 2021-10-22 NOTE — Patient Instructions (Signed)
Check labs:  SPEP/UPEP, IFE, ceruloplasmin, copper, zinc, HTLV-1, B12, GM 1 antibodies, PTH, TSH, CK and Lyme. Check MRI of brain and cervical spine with and without contrast Will schedule for nerve conduction study to test nerves and muscles Will expedite these tests and have you follow up with me soon after they are completed.

## 2021-10-23 ENCOUNTER — Telehealth: Payer: Self-pay

## 2021-10-23 DIAGNOSIS — R1310 Dysphagia, unspecified: Secondary | ICD-10-CM

## 2021-10-23 DIAGNOSIS — R531 Weakness: Secondary | ICD-10-CM

## 2021-10-23 DIAGNOSIS — R471 Dysarthria and anarthria: Secondary | ICD-10-CM

## 2021-10-23 MED ORDER — BUDESONIDE-FORMOTEROL FUMARATE 160-4.5 MCG/ACT IN AERO: 1.0000 | INHALATION_SPRAY | Freq: Two times a day (BID) | RESPIRATORY_TRACT | 5 refills | Status: DC

## 2021-10-23 NOTE — Telephone Encounter (Signed)
PA started for MRI Cervical spine W/WO Contrast no PA required.

## 2021-10-23 NOTE — Telephone Encounter (Signed)
MRI scheduled for 10/24/21 at [facility]. Pt is to arrive at 8:30 am at the short stay entrance.   LMOVM for pt.

## 2021-10-23 NOTE — Telephone Encounter (Signed)
-----   Message from Pieter Partridge, DO sent at 10/23/2021  9:10 AM EST ----- I spoke with [name] [name] and discussed the MRI results.  The MRI of brain shows a meningioma.  I do not think this would explain his symptoms but I would still like him evaluated by neurosurgery.  Also, the MRI of the cervical spine was not performed, which I would still like to have done.

## 2021-10-23 NOTE — Addendum Note (Signed)
Addended byDamita Dunnings D on: 10/23/2021 01:31 PM   Modules accepted: Orders

## 2021-10-24 ENCOUNTER — Telehealth: Payer: Self-pay | Admitting: Neurology

## 2021-10-24 ENCOUNTER — Ambulatory Visit: Payer: Medicare Other | Attending: Otolaryngology

## 2021-10-24 ENCOUNTER — Other Ambulatory Visit: Payer: Self-pay

## 2021-10-24 ENCOUNTER — Ambulatory Visit (HOSPITAL_COMMUNITY)
Admission: RE | Admit: 2021-10-24 | Discharge: 2021-10-24 | Disposition: A | Discharge status: Home / Self Care | Payer: Medicare Other | Source: Ambulatory Visit | Attending: Neurology | Admitting: Neurology

## 2021-10-24 DIAGNOSIS — R471 Dysarthria and anarthria: Secondary | ICD-10-CM | POA: Diagnosis present

## 2021-10-24 DIAGNOSIS — R531 Weakness: Secondary | ICD-10-CM | POA: Insufficient documentation

## 2021-10-24 DIAGNOSIS — R498 Other voice and resonance disorders: Secondary | ICD-10-CM | POA: Diagnosis present

## 2021-10-24 DIAGNOSIS — R1310 Dysphagia, unspecified: Secondary | ICD-10-CM | POA: Insufficient documentation

## 2021-10-24 IMAGING — MR MR CERVICAL SPINE WO/W CM
7 of 9 series · 32 of 48 positions shown · IV contrast (gadavist)
Comparison: None.

CLINICAL DATA: dysrthria

EXAM:
MRI CERVICAL SPINE WITHOUT AND WITH CONTRAST
TECHNIQUE: Multiplanar and multiecho pulse sequences of the cervical spine, to
include the craniocervical junction and cervicothoracic junction,
were obtained without and with intravenous contrast.
CONTRAST:  10 mL of Gadavist IV.

[Series 5: T1 · sagittal · 3.0mm · 0.69mm/px · 4 of 15 slices shown (1 of 2)]
[im 1/15]
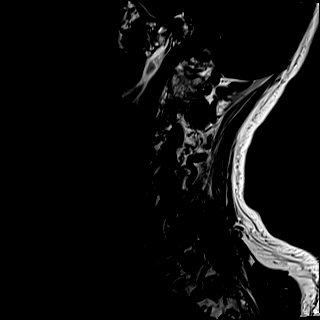
[im 5/15]
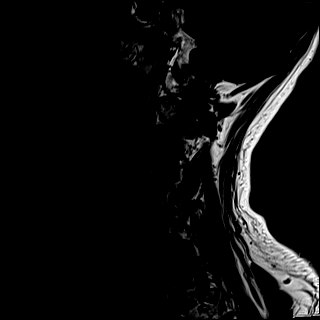
[im 10/15]
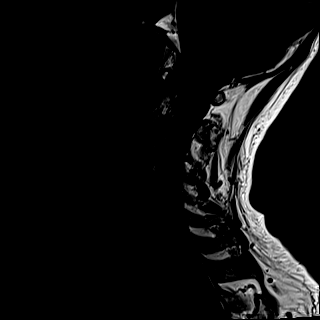
[im 15/15]
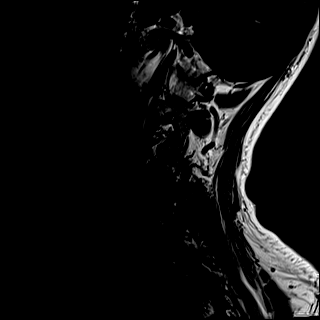

[Series 6: STIR · sagittal · 3.0mm · 0.86mm/px · 3 of 15 slices shown]
[im 1/15]
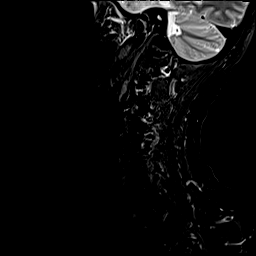
[im 8/15]
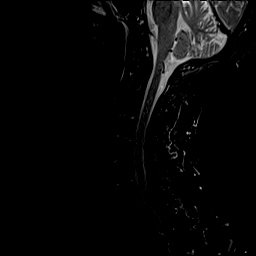
[im 15/15]
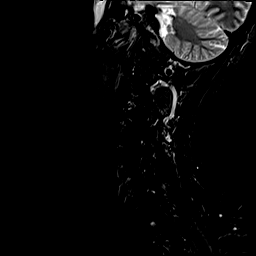

[Series 7: T2 · axial · 3.0mm · 0.70mm/px · z∈[-92,+42]mm · 8 of 39 slices shown]
[im 1/39]
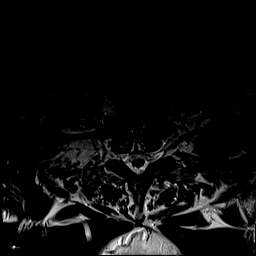
[im 6/39]
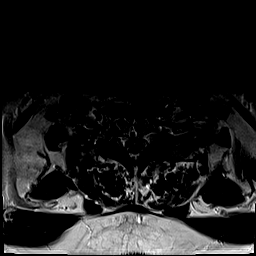
[im 11/39]
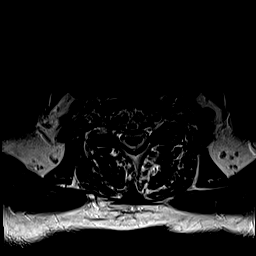
[im 17/39]
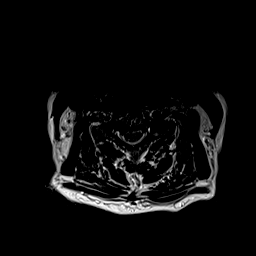
[im 22/39]
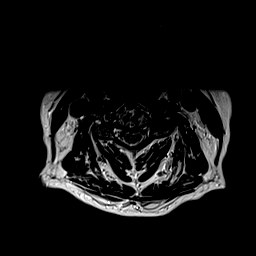
[im 28/39]
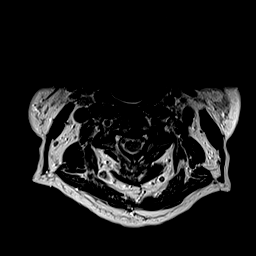
[im 33/39]
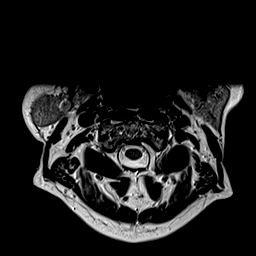
[im 39/39]
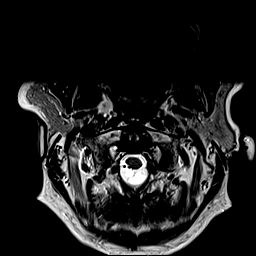

[Series 9: T1 · axial · 3.0mm · 0.35mm/px · z∈[-92,+42]mm · 8 of 39 slices shown (2 of 2)]
[im 1/39]
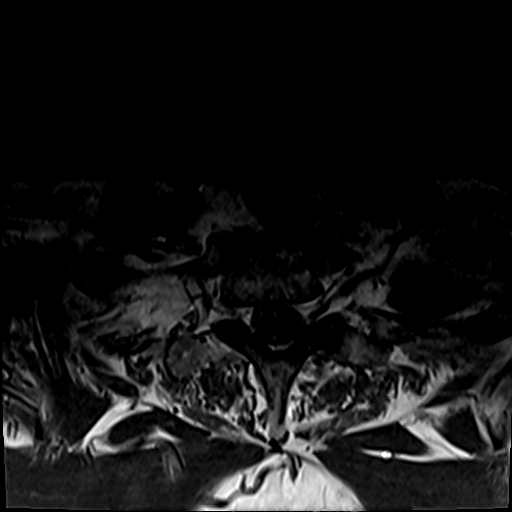
[im 6/39]
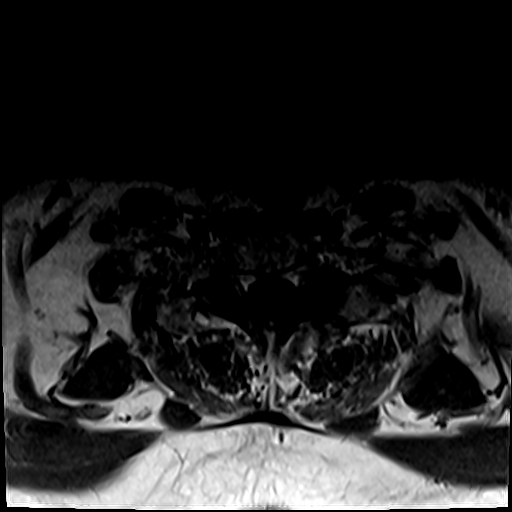
[im 11/39]
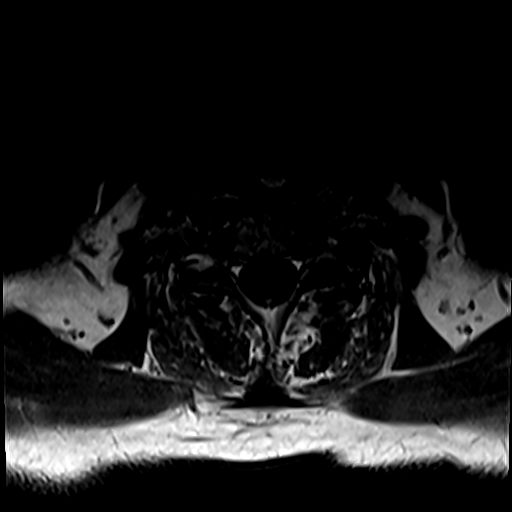
[im 17/39]
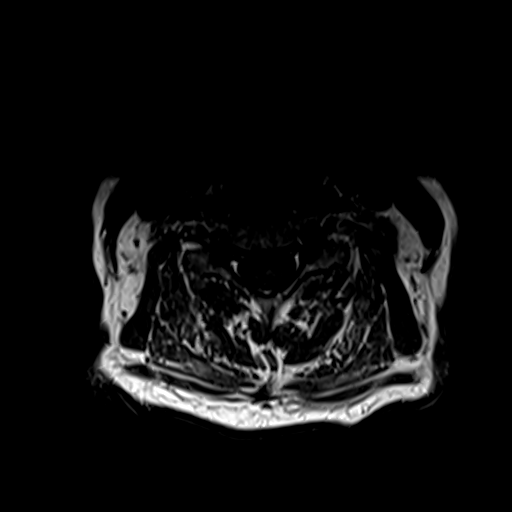
[im 22/39]
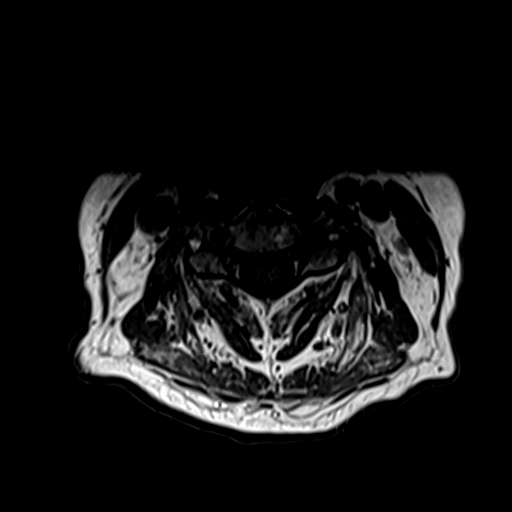
[im 28/39]
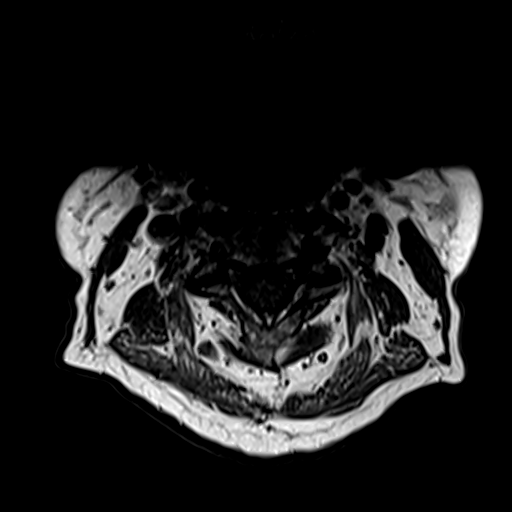
[im 33/39]
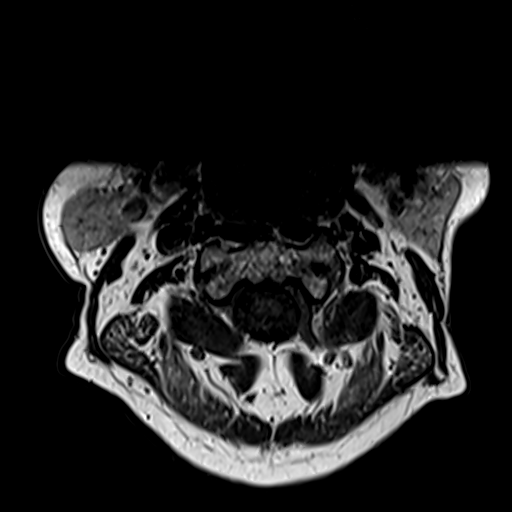
[im 39/39]
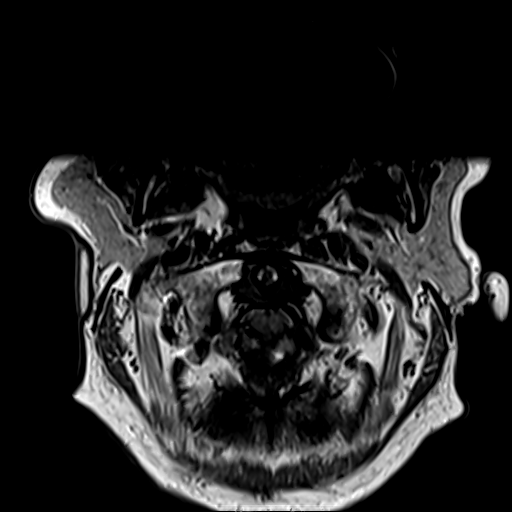

[Series 10: T2 post-contrast · sagittal · 3.0mm · 0.69mm/px · 3 of 15 slices shown]
[im 1/15]
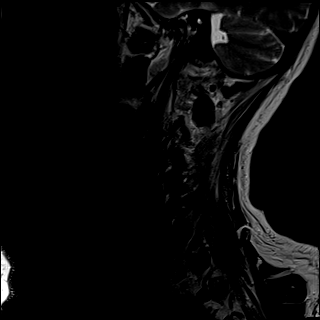
[im 8/15]
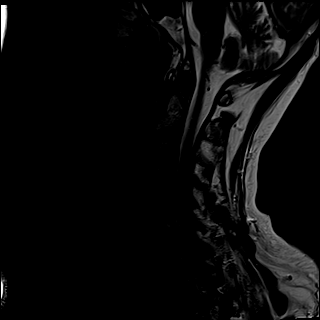
[im 15/15]
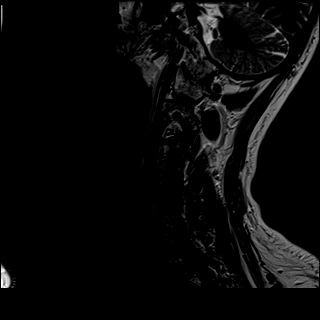

[Series 11: T1 fat-sat post-contrast · sagittal · 3.0mm · 0.69mm/px · 3 of 15 slices shown]
[im 1/15]
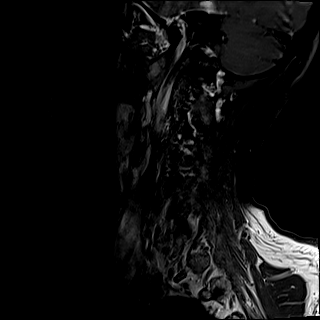
[im 8/15]
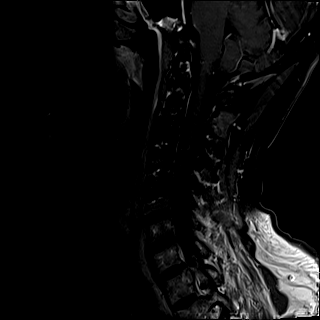
[im 15/15]
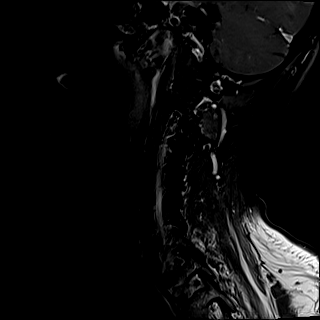

[Series 12: T1 post-contrast · axial · 3.0mm · 0.35mm/px · z∈[-92,-57]mm · 3 of 39 slices shown]
[im 1/39]
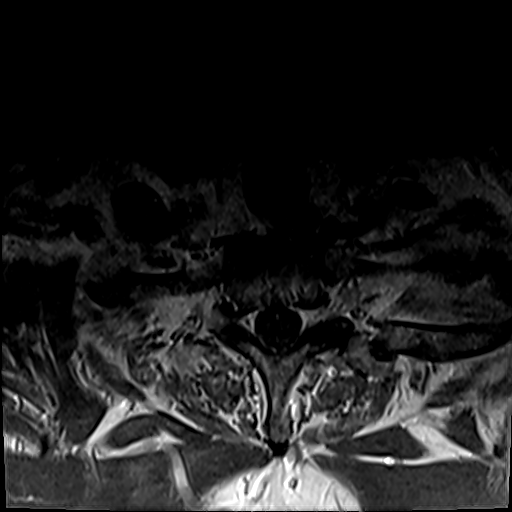
[im 6/39]
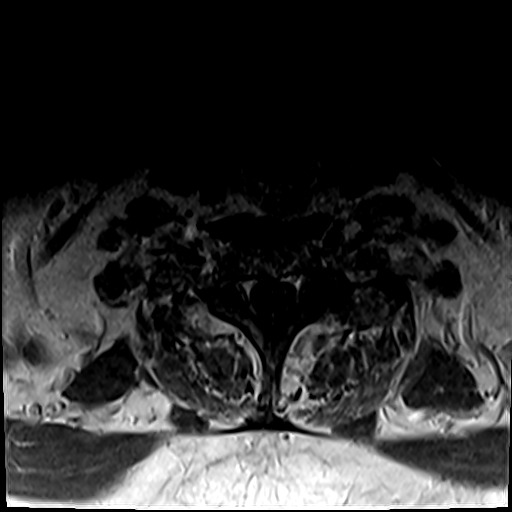
[im 11/39]
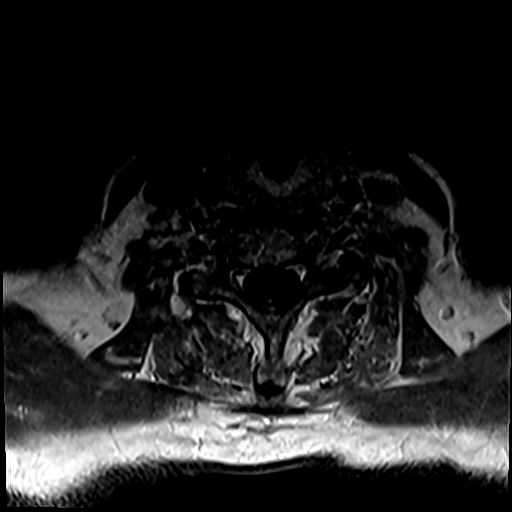

[32 of 48 positions shown; findings below may reference images not displayed]

FINDINGS: Alignment: No substantial sagittal subluxation.

Vertebrae: Vertebral body heights are maintained. No focal marrow
edema to suggest acute fracture or discitis/osteomyelitis. No
abnormal enhancement.

Cord: Normal cord signal.  No abnormal enhancement.

Posterior Fossa, vertebral arteries, paraspinal tissues: Visualized
vertebral artery flow voids are maintained. Please see recent MRI
head for evaluation of the posterior fossa.

Disc levels:

C2-C3: No significant disc protrusion, foraminal stenosis, or canal
stenosis.

C3-C4: Small posterior disc osteophyte complex. Bilateral facet
uncovertebral hypertrophy. Resulting mild canal and left foraminal
stenosis. Patent right foramen.

C4-C5: Small posterior disc osteophyte complex and bilateral facet
and uncovertebral hypertrophy. Resulting mild canal and left
foraminal stenosis. Patent right foramen.

C5-C6: Right eccentric posterior disc osteophyte complex and right
greater than left facet and uncovertebral hypertrophy. Resulting
moderate right and mild left foraminal stenosis. No significant
canal stenosis.

C6-C7: Mild right uncovertebral hypertrophy. No significant canal or
foraminal stenosis.

C7-T1: Mild bilateral facet arthropathy without significant canal or
foraminal stenosis
IMPRESSION: 1. At C5-C6, moderate right and mild left foraminal stenosis.
2. At C3-C4 and C4-C5, mild canal and left foraminal stenosis.

## 2021-10-24 MED ORDER — GADOBUTROL 1 MMOL/ML IV SOLN
10.0000 mL | Freq: Once | INTRAVENOUS | Status: AC | PRN
  Administered 2021-10-24: 10:00:00 10 mL via INTRAVENOUS

## 2021-10-24 NOTE — Therapy (Signed)
[facility] [address] [place], [place], [address] Phone: [phone]   Fax:  [phone]  Speech Language Pathology Treatment  Patient Details  Name: [name] [name] MRN: [id number] Date of Birth: [birth year] Referring Provider (SLP): Rozetta Nunnery, MD (PCP/doc - Colon Branch MD)   Encounter Date: 10/24/2021   End of Session - 10/24/21 1633     Visit Number 6    Number of Visits 25    Date for SLP Re-Evaluation 11/21/21    Authorization Type UHC Medicare    SLP Start Time 1318    SLP Stop Time  1400    SLP Time Calculation (min) 42 min    Activity Tolerance Patient tolerated treatment well             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   Colon polyps    Colon polyps at age 70, next Cscope age 69   Diabetes mellitus    DJD (degenerative joint disease)    Glaucoma suspect    Hyperlipidemia     Past Surgical History:  Procedure Laterality Date   BUNIONECTOMY Right 12/27/2014   @[facility]   BUNIONECTOMY Left    2019   ESOPHAGEAL MANOMETRY N/A 07/23/2021   Procedure: ESOPHAGEAL MANOMETRY (EM);  Surgeon: Wilford Corner, MD;  Location: [facility];  Service: Endoscopy;  Laterality: N/A;   NASAL SEPTUM SURGERY  2000   SHOULDER ARTHROSCOPY     L 2011, R 2012    There were no vitals filed for this visit.   Subjective Assessment - 10/24/21 1325     Subjective "They found that there was something in my brain but he said that shouldn't affect my talking or my swallow."    Currently in Pain? No/denies                   ADULT SLP TREATMENT - 10/24/21 1325       General Information   Behavior/Cognition Alert;Cooperative;Pleasant mood      Treatment Provided   Treatment provided Cognitive-Linquistic      Dysphagia Treatment   Temperature Spikes Noted No    Other treatment/comments Pt states frequency of cough/choking is better now than  before to where it occurs once every day, and decr'd pharyngeal clearance occurs more frequently near the end of the meal. SLP encouraged pt to have more frequent smaller meals and see if this decreases in frequency. As pt drank H2O today, no overt s/sx aspiration were observed in sips x4. Pt's voice was non-hydrophonic throughout the session. Recommending objective swallow eval (FEES over MBS in order to visualize vocal folds) for pt - will suggest this to Dr. Tomi Likens.      Cognitive-Linquistic Treatment   Treatment focused on Voice;Dysarthria;Patient/family/caregiver education    Skilled Treatment Pt cont to read sentences - "Sometimes I can't get through the whole list without my voice getting weak." Pt told SLP that he tries to make calls in teh AMs, turn off ambient/background noise, talk facing the person, and other intelligibility modifications to environment.      Assessment / Recommendations / Plan   Plan Continue with current plan of care      Progression Toward Goals   Progression toward goals Progressing toward goals              SLP Education - 10/24/21 1633     Education Details need for swallow assessment    Person(s) Educated  Patient    Methods Explanation    Comprehension Verbalized understanding              SLP Short Term Goals - 10/24/21 1640       SLP SHORT TERM GOAL #1   Title Pt will complete voice exercise HEP given occasional min A over 3 sessions2    Time 3    Period Weeks    Status On-going      SLP SHORT TERM GOAL #2   Title Pt will demonstrate increased awareness of throat clearing and use appropriate substitute with 80% accuracy over 3 sessions    Time 2    Period Weeks    Status On-going      SLP SHORT TERM GOAL #3   Title Pt will utilize abdominal breathing for 18/20 sentence responses given rare min A over 2 sessions    Time 22    Period Weeks    Status On-going      SLP SHORT TERM GOAL #4   Title Pt will demonstrate compensations to  maximize speech intelligiblity in 5 minute conversation given occasional min A over 2 sessions    Time 2    Period Weeks    Status On-going      SLP SHORT TERM GOAL #5   Title Pt will complete bedside swallow evaluation to determine need for instrumental swallow study (as needed)    Status Achieved              SLP Long Term Goals - 10/24/21 1641       SLP LONG TERM GOAL #1   Title Pt will complete voice exercise HEP given rare min A over 3 sessions    Time 10    Period Weeks    Status On-going      SLP LONG TERM GOAL #2   Title Pt will follow vocal hygiene protocol > 2 weeks given rare min A    Time 10    Period Weeks    Status On-going      SLP LONG TERM GOAL #3   Title Pt will demonstrate awareness of hoarse voice and utilize abdominal breathing with 90% accuracy over 2 sessions    Time 10    Period Weeks    Status On-going      SLP LONG TERM GOAL #4   Title Pt will demonstrate compensations to maximize speech intelligibility in 15+ minute conversation given occasional min A over 2 sessions    Time 10    Period Weeks    Status On-going      SLP LONG TERM GOAL #5   Title Pt will undergo objective swallow study to evaluate pharyngeal function and assess for risk of aspiration as needed    Time 10    Period Weeks    Status On-going              Plan - 10/24/21 1634     Clinical Impression Statement Churchill continues to present with worsening dysarthria, dysphonia and dysphagia. Intelligiblity is affected. Continued training in compensations for dysarthria and dysphonia, including environmental modifications. Of note, pt's voice and speech worsened after 15 minutes of ST today. Dysphagia persists and FEES is recommended over MBSS to visualize vocal folds. Noted meningioma ID'd with brain imaging since last visit. Continue skilled ST to maximize intelligibility, safety of swallow and optimal voice quality for safety, and incr'd QOL.    Speech Therapy Frequency 2x /  week    Duration  12 weeks    Treatment/Interventions SLP instruction and feedback;Compensatory techniques;Functional tasks;Patient/family education;Compensatory strategies    Potential to Achieve Goals Fair    Potential Considerations Other (comment)   unknown etiology - await neuro consult   Consulted and Agree with Plan of Care Patient             Patient will benefit from skilled therapeutic intervention in order to improve the following deficits and impairments:   Dysarthria and anarthria  Dysphagia, unspecified type  Other voice and resonance disorders    Problem List Patient Active Problem List   Diagnosis Date Noted   Gastroesophageal reflux disease 10/08/2021   Esophageal erosions 10/08/2021   Asymptomatic PVCs 06/02/2021   Essential hypertension 06/02/2021   Coronary artery disease involving native coronary artery of native heart without angina pectoris 06/02/2021   Atherosclerosis of aorta ([place]) 06/02/2021   Chronic obstructive pulmonary disease ([place]) 02/18/2021   Allergic rhinitis 02/06/2020   Glaucoma suspect 10/02/2019   PCP NOTES >>> 08/07/2015   Hypothyroidism 05/01/2015   Neuroma of foot 03/29/2015   Status post right foot surgery 01/22/2015   Plantar fasciitis of right foot 12/03/2014   Metatarsal deformity 12/03/2014   Equinus deformity of foot, acquired 12/03/2014   Bunion 12/03/2014   Annual physical exam 08/04/2012   Erectile dysfunction 08/04/2012   Type 2 diabetes mellitus with microalbuminuria, without long-term current use of insulin ([place]) 05/04/2012   Hyperlipidemia 05/04/2012   DJD (degenerative joint disease) 05/04/2012   Bradycardia 05/04/2012    Garald Balding, [place] 10/24/2021, 4:42 PM  [facility] [address] [place], [place], [address] Phone: [phone]   Fax:  [phone]   Name: DILLIAN FEIG MRN: [id number] Date of Birth: [birth year]

## 2021-10-24 NOTE — Telephone Encounter (Signed)
Pt advised of his MRI Cercvical spine results. Neuro Surgeon referral faxed.

## 2021-10-24 NOTE — Progress Notes (Signed)
[facility] referral added. Need to give MRI results.

## 2021-10-24 NOTE — Progress Notes (Signed)
Pt advised of his MRI Cervical spine results.

## 2021-10-24 NOTE — Telephone Encounter (Signed)
Patient said he is returning a call to [employer]

## 2021-10-28 ENCOUNTER — Encounter: Payer: Self-pay | Admitting: Neurology

## 2021-10-28 ENCOUNTER — Ambulatory Visit: Payer: Medicare Other

## 2021-10-28 ENCOUNTER — Telehealth: Payer: Self-pay

## 2021-10-28 DIAGNOSIS — R1310 Dysphagia, unspecified: Secondary | ICD-10-CM

## 2021-10-28 NOTE — Telephone Encounter (Signed)
-----   Message from Pieter Partridge, DO sent at 10/28/2021  7:13 AM EST ----- Vita Barley  TO evaluate dysphagia, I would like to order a FEES (fiberoptic endoscopic evaluation of swallowing).  Thanks

## 2021-10-28 NOTE — Telephone Encounter (Signed)
SLP FEES ordered.

## 2021-10-31 ENCOUNTER — Ambulatory Visit: Payer: Medicare Other | Attending: Otolaryngology

## 2021-10-31 ENCOUNTER — Other Ambulatory Visit: Payer: Self-pay

## 2021-10-31 DIAGNOSIS — R498 Other voice and resonance disorders: Secondary | ICD-10-CM | POA: Insufficient documentation

## 2021-10-31 DIAGNOSIS — R471 Dysarthria and anarthria: Secondary | ICD-10-CM | POA: Insufficient documentation

## 2021-10-31 DIAGNOSIS — R1310 Dysphagia, unspecified: Secondary | ICD-10-CM | POA: Insufficient documentation

## 2021-10-31 NOTE — Therapy (Signed)
[facility] [address] [place], [place], [address] Phone: [phone]   Fax:  [phone]  Speech Language Pathology Treatment  Patient Details  Name: [name] [name] MRN: [id number] Date of Birth: January 15, 1951 Referring Provider (SLP): Rozetta Nunnery, MD (PCP/doc - Colon Branch MD)   Encounter Date: 10/31/2021   End of Session - 10/31/21 1225     Visit Number 7    Number of Visits 25    Date for SLP Re-Evaluation 11/21/21    Authorization Type UHC Medicare    SLP Start Time 1226    SLP Stop Time  1300    SLP Time Calculation (min) 34 min    Activity Tolerance Patient tolerated treatment well             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   Colon polyps    Colon polyps at age 52, next Cscope age 53   Diabetes mellitus    DJD (degenerative joint disease)    Glaucoma suspect    Hyperlipidemia     Past Surgical History:  Procedure Laterality Date   [facility]   BUNIONECTOMY Left    2019   ESOPHAGEAL MANOMETRY N/A 07/23/2021   Procedure: ESOPHAGEAL MANOMETRY (EM);  Surgeon: Wilford Corner, MD;  [facility];  Service: Endoscopy;  Laterality: N/A;   NASAL SEPTUM SURGERY  2000   SHOULDER ARTHROSCOPY     L 2011, R 2012    There were no vitals filed for this visit.   Subjective Assessment - 10/31/21 1226     Subjective "it's moving along"    Currently in Pain? No/denies                   ADULT SLP TREATMENT - 10/31/21 1224       General Information   Behavior/Cognition Alert;Cooperative;Pleasant mood      Treatment Provided   Treatment provided Dysphagia;Cognitive-Linquistic      Dysphagia Treatment   Temperature Spikes Noted No    Respiratory Status Room air    Treatment Methods Skilled observation;Compensation strategy training    Patient observed directly with PO's Yes    Type of PO's  observed Thin liquids    Feeding Able to feed self    Liquids provided via Cup    Pharyngeal Phase Signs & Symptoms Delayed throat clear    Type of cueing Verbal    Amount of cueing Minimal    Other treatment/comments Pt consumed multiple bottle sips of water with no immediate s/sx of aspiration. Occasional throat clears x3 noted in conversation today, with pt c/o increased saliva. Pt reports he is unable to reduce throat clearing due to saliva/phlegm impact on his breathing. SLP re-educated recommended compensatory measures, including small bites,  alternation of liquids/solids, and smaller meals to reduce fatigue. Pt is avoiding troubled textures, such as rice, which are difficult to clear. SLP reviewed recommendation for FEES and that process. Pt verbalized understanding and agreement. Hold ST tx until FEES completed as pt feels comfortable implementing current recommended compensatory measures.      Cognitive-Linquistic Treatment   Treatment focused on Voice;Dysarthria;Patient/family/caregiver education    Skilled Treatment Pt continues with flaccid dysathria which worsened as conversation progressed. SLP re-educated compensatory measures to optimize communication effectiveness, including speaking in limiting duration of conversations, using shorter sentences, increasing frequency of breathing, and facing conversational partner. SLP also recommended use of written communication (writing,  typing, texting) as needed.      Assessment / Recommendations / Plan   Plan Continue with current plan of care   on hold until FEES completed     Progression Toward Goals   Progression toward goals Progressing toward goals              SLP Education - 10/31/21 1305     Education Details hold tx until FEES completed, compensatory measures for swallowing/communication    Person(s) Educated Patient    Methods Explanation;Demonstration    Comprehension Verbalized understanding;Returned demonstration               SLP Short Term Goals - 10/31/21 1225       SLP SHORT TERM GOAL #1   Title Pt will complete voice exercise HEP given occasional min A over 3 sessions2    Status Deferred      SLP SHORT TERM GOAL #2   Title Pt will demonstrate increased awareness of throat clearing and use appropriate substitute with 80% accuracy over 3 sessions    Time --    Period --    Status Deferred      SLP SHORT TERM GOAL #3   Title Pt will utilize abdominal breathing for 18/20 sentence responses given rare min A over 2 sessions    Time --    Period --    Status Partially Met      SLP SHORT TERM GOAL #4   Title Pt will demonstrate compensations to maximize speech intelligiblity in 5 minute conversation given occasional min A over 2 sessions    Time --    Period --    Status Partially Met      SLP SHORT TERM GOAL #5   Title Pt will complete bedside swallow evaluation to determine need for instrumental swallow study (as needed)    Status Achieved              SLP Long Term Goals - 10/31/21 1226       SLP LONG TERM GOAL #1   Title Pt will complete voice exercise HEP given rare min A over 3 sessions    Time 8    Period Weeks    Status On-going      SLP LONG TERM GOAL #2   Title Pt will follow vocal hygiene protocol > 2 weeks given rare min A    Time 8    Period Weeks    Status On-going      SLP LONG TERM GOAL #3   Title Pt will demonstrate awareness of hoarse voice and utilize abdominal breathing with 90% accuracy over 2 sessions    Time 8    Period Weeks    Status On-going      SLP LONG TERM GOAL #4   Title Pt will demonstrate compensations to maximize speech intelligibility in 15+ minute conversation given occasional min A over 2 sessions    Time 8    Period Weeks    Status On-going      SLP LONG TERM GOAL #5   Title Pt will undergo objective swallow study to evaluate pharyngeal function and assess for risk of aspiration as needed    Time 8    Period Weeks     Status On-going              Plan - 10/31/21 1225     Clinical Impression Statement Sajan continues to present with worsening dysarthria, dysphonia and dysphagia. Intelligiblity is affected.  Continued training in compensations for dysarthria and dysphonia, including communication and environmental modifications. Dysphagia still persists and FEES is recommended over MBSS to visualize vocal folds. Hold ST tx until FEES completed. Noted meningioma ID'd with brain imaging since last visit. Continue skilled ST to maximize intelligibility, safety of swallow and optimal voice quality for safety, and incr'd QOL.    Speech Therapy Frequency 2x / week    Duration 12 weeks    Treatment/Interventions SLP instruction and feedback;Compensatory techniques;Functional tasks;Patient/family education;Compensatory strategies    Potential to Achieve Goals Fair    Potential Considerations Other (comment)   unknown etiology - await neuro consult   Consulted and Agree with Plan of Care Patient             Patient will benefit from skilled therapeutic intervention in order to improve the following deficits and impairments:   Dysphagia, unspecified type  Dysarthria and anarthria    Problem List Patient Active Problem List   Diagnosis Date Noted   Gastroesophageal reflux disease 10/08/2021   Esophageal erosions 10/08/2021   Asymptomatic PVCs 06/02/2021   Essential hypertension 06/02/2021   Coronary artery disease involving native coronary artery of native heart without angina pectoris 06/02/2021   Atherosclerosis of aorta ([place]) 06/02/2021   Chronic obstructive pulmonary disease ([place]) 02/18/2021   Allergic rhinitis 02/06/2020   Glaucoma suspect 10/02/2019   PCP NOTES >>> 08/07/2015   Hypothyroidism 05/01/2015   Neuroma of foot 03/29/2015   Status post right foot surgery 01/22/2015   Plantar fasciitis of right foot 12/03/2014   Metatarsal deformity 12/03/2014   Equinus deformity of foot, acquired  12/03/2014   Bunion 12/03/2014   Annual physical exam 08/04/2012   Erectile dysfunction 08/04/2012   Type 2 diabetes mellitus with microalbuminuria, without long-term current use of insulin ([place]) 05/04/2012   Hyperlipidemia 05/04/2012   DJD (degenerative joint disease) 05/04/2012   Bradycardia 05/04/2012    Alinda Deem, CCC-SLP 10/31/2021, 1:07 PM  [facility] [address] [place], [place], [address] Phone: [phone]   Fax:  [phone]   Name: [name] [name] MRN: [id number] Date of Birth: April 15, 1951

## 2021-11-03 DIAGNOSIS — R1310 Dysphagia, unspecified: Secondary | ICD-10-CM | POA: Diagnosis not present

## 2021-11-06 ENCOUNTER — Encounter: Payer: Self-pay | Admitting: Neurology

## 2021-11-06 DIAGNOSIS — D329 Benign neoplasm of meninges, unspecified: Secondary | ICD-10-CM | POA: Diagnosis not present

## 2021-11-06 NOTE — Telephone Encounter (Signed)
Please fax notes to [phone] [facility]   They will need office notes and the injection note ( she thinks the injection was in Dec)

## 2021-11-07 ENCOUNTER — Ambulatory Visit: Payer: Medicare Other

## 2021-11-10 ENCOUNTER — Encounter: Payer: Self-pay | Admitting: Neurology

## 2021-11-10 ENCOUNTER — Encounter: Payer: Self-pay | Admitting: Speech Pathology

## 2021-11-13 ENCOUNTER — Ambulatory Visit: Payer: Medicare Other | Admitting: Neurology

## 2021-11-13 ENCOUNTER — Other Ambulatory Visit: Payer: Self-pay

## 2021-11-13 DIAGNOSIS — R1310 Dysphagia, unspecified: Secondary | ICD-10-CM

## 2021-11-13 DIAGNOSIS — G1220 Motor neuron disease, unspecified: Secondary | ICD-10-CM

## 2021-11-13 DIAGNOSIS — R252 Cramp and spasm: Secondary | ICD-10-CM

## 2021-11-13 DIAGNOSIS — R471 Dysarthria and anarthria: Secondary | ICD-10-CM | POA: Diagnosis not present

## 2021-11-13 DIAGNOSIS — R531 Weakness: Secondary | ICD-10-CM

## 2021-11-14 ENCOUNTER — Ambulatory Visit (HOSPITAL_COMMUNITY)
Admission: RE | Admit: 2021-11-14 | Discharge: 2021-11-14 | Disposition: A | Discharge status: Home / Self Care | Payer: Medicare Other | Source: Ambulatory Visit | Attending: Neurology | Admitting: Neurology

## 2021-11-14 ENCOUNTER — Ambulatory Visit: Payer: Medicare Other | Admitting: Speech Pathology

## 2021-11-14 DIAGNOSIS — R471 Dysarthria and anarthria: Secondary | ICD-10-CM | POA: Diagnosis not present

## 2021-11-14 DIAGNOSIS — R490 Dysphonia: Secondary | ICD-10-CM | POA: Insufficient documentation

## 2021-11-14 DIAGNOSIS — R531 Weakness: Secondary | ICD-10-CM | POA: Insufficient documentation

## 2021-11-14 DIAGNOSIS — R252 Cramp and spasm: Secondary | ICD-10-CM | POA: Insufficient documentation

## 2021-11-14 DIAGNOSIS — R1312 Dysphagia, oropharyngeal phase: Secondary | ICD-10-CM | POA: Insufficient documentation

## 2021-11-14 DIAGNOSIS — R1310 Dysphagia, unspecified: Secondary | ICD-10-CM

## 2021-11-14 NOTE — Procedures (Addendum)
Objective Swallowing Evaluation: Type of Study: FEES-Fiberoptic Endoscopic Evaluation of Swallow   Patient Details  Name: [name] [name] MRN: [id number] Date of Birth: 1951-03-10  Today's Date: 11/14/2021 Time: SLP Start Time (ACUTE ONLY): 1012 -SLP Stop Time (ACUTE ONLY): 1040  SLP Time Calculation (min) (ACUTE ONLY): 28 min   Past Medical History:  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   Colon polyps    Colon polyps at age 71, next Cscope age 45   Diabetes mellitus    DJD (degenerative joint disease)    Glaucoma suspect    Hyperlipidemia    Past Surgical History:  Past Surgical History:  Procedure Laterality Date   BUNIONECTOMY Right 12/27/2014   @[facility]   BUNIONECTOMY Left    2019   ESOPHAGEAL MANOMETRY N/A 07/23/2021   Procedure: ESOPHAGEAL MANOMETRY (EM);  Surgeon: Wilford Corner, MD;  Location: [facility];  Service: Endoscopy;  Laterality: N/A;   NASAL SEPTUM SURGERY  2000   SHOULDER ARTHROSCOPY     L 2011, R 2012   HPI: Pt is a 71 year old male arriving for an OP FEES due to onset of dysphagia, dysarthria, dyspnea and weakness of extremities and muscle cramps over the past few months. Symptoms started as hoarseness and throat clearing and pt had esophagram and ENT consult without significant finding. Symptoms have progressed and pt is currently being worked up by Neurology for motor neuron disease. Pulmonary funcion testing suggested asthma, but NIF not reported on test apparently. OP SLP has targeted voice, abdominal breathing and compensatory strategies for swallowing and speech. Pt denies increased fatigue with meals today, but prior SLP notes have documented his report of this. Pt primarily reports difficulty coughing with saliva or if drinking too quickly and also struggling with grainy textures like rice.   No data recorded   Recommendations for follow up therapy are one component of a multi-disciplinary discharge planning  process, led by the attending physician.  Recommendations may be updated based on patient status, additional functional criteria and insurance authorization.  Assessment / Plan / Recommendation  Clinical Impressions 11/14/2021  Clinical Impression Pt demonstrates a constellation of symptoms concerning for motor neuron disease. Pt is dysarthric with slow effortful articulation and impaired resonsance and pitch. His volitional cough is weak without any plosive ability despite adequate laryngeal closure during non speech tasks during FEES, suggesting decreased diaphragm/accessory muscle strength for cough. Laryngeal sensation appears intact. Pts primary impairment impacting swallow appears to be limited laryngeal elevation. Pt has adequate base of tongue movement, symmetrical adduction of arytenoids and full epiglottic deflection. However, there is moderate vallecular residue with solids and concern for aspiration during the swallow at times, though this is poorly visualized with a FEES given white out during the swallow. Suspect incomplete contact with arytenoids to posterior surface of the epiglottis due to decreased laryngeal elevation. Pt had coughing just after swallows though no aspiration visualized. Pt seemed to benefit from a chin tuck/leaning head forward during swallowing. Also following solids with liquids. Further neurologic work up needed to determine appropriate plan of care. If swallows function worsens, recommend temporarily thickening liquids but also acute work up given high risk of aspiration and respiratory failure with weak cough.  SLP Visit Diagnosis Dysphagia, oropharyngeal phase (R13.12)  Attention and concentration deficit following --  Frontal lobe and executive function deficit following --  Impact on safety and function Moderate aspiration risk      Treatment Recommendations 11/14/2021  Treatment Recommendations Defer  treatment plan to f/u with SLP     No flowsheet data  found.  Diet Recommendations 11/14/2021  SLP Diet Recommendations Regular solids;Thin liquid  Liquid Administration via Cup;Straw  Medication Administration Whole meds with liquid  Compensations Follow solids with liquid;Chin tuck  Postural Changes Seated upright at 90 degrees      No flowsheet data found.  No flowsheet data found.    Oral Phase 11/14/2021  Oral Phase WFL  Oral - Pudding Teaspoon --  Oral - Pudding Cup --  Oral - Honey Teaspoon --  Oral - Honey Cup --  Oral - Nectar Teaspoon --  Oral - Nectar Cup --  Oral - Nectar Straw --  Oral - Thin Teaspoon --  Oral - Thin Cup --  Oral - Thin Straw --  Oral - Puree --  Oral - Mech Soft --  Oral - Regular --  Oral - Multi-Consistency --  Oral - Pill --  Oral Phase - Comment --    Pharyngeal Phase 11/14/2021  Pharyngeal Phase Impaired  Pharyngeal- Pudding Teaspoon --  Pharyngeal --  Pharyngeal- Pudding Cup --  Pharyngeal --  Pharyngeal- Honey Teaspoon --  Pharyngeal --  Pharyngeal- Honey Cup --  Pharyngeal --  Pharyngeal- Nectar Teaspoon --  Pharyngeal --  Pharyngeal- Nectar Cup --  Pharyngeal --  Pharyngeal- Nectar Straw --  Pharyngeal --  Pharyngeal- Thin Teaspoon --  Pharyngeal --  Pharyngeal- Thin Cup Reduced laryngeal elevation;Penetration/Aspiration during swallow  Pharyngeal --  Pharyngeal- Thin Straw Reduced laryngeal elevation;Penetration/Aspiration during swallow  Pharyngeal --  Pharyngeal- Puree Reduced laryngeal elevation;Pharyngeal residue - valleculae  Pharyngeal --  Pharyngeal- Mechanical Soft --  Pharyngeal --  Pharyngeal- Regular Pharyngeal residue - valleculae;Reduced laryngeal elevation  Pharyngeal --  Pharyngeal- Multi-consistency --  Pharyngeal --  Pharyngeal- Pill --  Pharyngeal --  Pharyngeal Comment --     No flowsheet data found.  Herbie Baltimore, MA CCC-SLP  Acute Rehabilitation Services Office [phone]  Lynann Beaver 11/14/2021, 1:05 PM

## 2021-11-18 ENCOUNTER — Other Ambulatory Visit (INDEPENDENT_AMBULATORY_CARE_PROVIDER_SITE_OTHER): Payer: Medicare Other

## 2021-11-18 ENCOUNTER — Other Ambulatory Visit: Payer: Self-pay

## 2021-11-18 DIAGNOSIS — R531 Weakness: Secondary | ICD-10-CM

## 2021-11-18 DIAGNOSIS — R1310 Dysphagia, unspecified: Secondary | ICD-10-CM | POA: Diagnosis not present

## 2021-11-18 DIAGNOSIS — R252 Cramp and spasm: Secondary | ICD-10-CM

## 2021-11-18 DIAGNOSIS — R471 Dysarthria and anarthria: Secondary | ICD-10-CM

## 2021-11-18 LAB — VITAMIN B12: Vitamin B-12: 327 pg/mL (ref 211–911)

## 2021-11-18 LAB — TSH: TSH: 2.32 u[IU]/mL (ref 0.35–5.50)

## 2021-11-18 LAB — CK: Total CK: 180 U/L (ref 7–232)

## 2021-11-18 NOTE — Procedures (Signed)
[facility]  [place], [place]  [place], [place] [address] Tel: [phone] Fax:  [phone] Test Date:  11/13/2021  Patient: [name] [name] DOB: 13-Nov-1950 Physician: Narda Amber, DO  Sex: Male Height: 5\' 7"  Ref Phys: Metta Clines, D.O.  ID#: [id number]   Technician:    Patient Complaints: This is a 71 year old man referred for evaluation of progressive weakness and dysarthria.  NCV & EMG Findings: Extensive electrodiagnostic testing of the left upper extremity and additional studies of the right shows:  All sensory responses including the left median, ulnar, sural, and superficial peroneal nerves are within normal limits. All motor responses including the left median, ulnar, peroneal, and tibial nerves show reduced amplitudes (L4.8, 6.5, 5.6, 2.6, 1.3 mV).  Left peroneal motor response at the extensor digitorum brevis is absent. Left tibial H reflex study is absent. In the left upper extremity, diffuse chronic motor axonal loss changes are seen affecting all of the tested muscles including the C5-T1 myotome, with active denervation involving the biceps and pronator teres. In the left lower extremity, diffuse chronic motor axonal loss changes are seen affecting nearly all the tested muscles with active denervation involving the left anterior tibialis and gastrocnemius muscles. Fibrillation potentials are seen at the mid thoracic paraspinal levels at the T11 level only Active on chronic motor axonal loss changes are seen affecting the hyoglossus muscle. Fasciculation potentials are not seen in any of the tested muscles.  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   ___________________________ Narda Amber, DO    Nerve Conduction Studies Anti Sensory Summary Table   Stim Site NR Peak (ms) Norm  Peak (ms) P-T Amp (V) Norm P-T Amp  Left Median Anti Sensory (2nd Digit)  32C  Wrist    3.3 <3.8 34.9 >10  Left Sup Peroneal Anti Sensory (Ant Lat Mall)  32C  12 cm    3.5 <4.6 7.7 >3  Left Sural Anti Sensory (Lat Mall)  32C  Calf    3.7 <4.6 10.5 >3  Left Ulnar Anti Sensory (5th Digit)  32C  Wrist    3.2 <3.2 17.7 >5   Motor Summary Table   Stim Site NR Onset (ms) Norm Onset (ms) O-P Amp (mV) Norm O-P Amp Site1 Site2 Delta-0 (ms) Dist (cm) Vel (m/s) Norm Vel (m/s)  Left Median Motor (Abd Poll Brev)  32C  Wrist    3.4 <4.0 4.8 >5 Elbow Wrist 5.7 32.0 56 >50  Elbow    9.1  4.7         Left Peroneal Motor (Ext Dig Brev)  32C  Ankle NR  <6.0  >2.5 B Fib Ankle  0.0  >40  B Fib NR     Poplt B Fib  0.0  >40  Poplt NR            Left Peroneal TA Motor (Tib Ant)  32C  Fib Head    3.2 <4.5 2.6 >3 Poplit Fib Head 1.7 10.0 59 >40  Poplit    4.9  2.2         Left Tibial Motor (Abd Hall Brev)  32C  Ankle    5.9 <6.0 1.3 >4 Knee Ankle 11.1 44.0 40 >40  Knee    17.0  1.1         Left Ulnar Motor (Abd Dig Minimi)  32C  Wrist    3.1 <3.1 6.5 >7 B Elbow Wrist 4.4 24.0 55 >50  B Elbow    7.5  6.3  A Elbow B Elbow 1.9 10.0 53 >50  A Elbow    9.4  6.0         Left Ulnar (FDI) Motor (1st DI)  32C  Wrist    4.4 <4.5 5.6 >7 B Elbow Wrist 4.8 24.0 50 >50  B Elbow    9.2  5.1  A Elbow B Elbow 2.0 10.0 50 >50  A Elbow    11.2  4.8          H Reflex Studies   NR H-Lat (ms) Lat Norm (ms) L-R H-Lat (ms)  Left Tibial (Gastroc)  32C  NR  <35    EMG   Side Muscle Ins Act Fibs Psw Fasc Number Recrt Dur Dur. Amp Amp. Poly Poly. Comment  Left AntTibialis Nml 1+ Nml Nml 2- Rapid Most 1+ Most 1+ Most 1+ N/A  Left Gastroc Nml 1+ Nml Nml 2- Rapid Many 1+ Many 1+ Many 1+ N/A  Left Flex Dig Long Nml Nml Nml Nml NE None - - - - - - N/A  Left RectFemoris Nml Nml Nml Nml 1- Rapid Some 1+ Some 1+ Some 1+ N/A  Left BicepsFemS Nml Nml Nml Nml 1- Rapid Some 1+ Some 2-.3- Some 1+ N/A  Left GluteusMed Nml  Nml Nml Nml Nml Nml Nml Nml Nml Nml Nml Nml N/A  Left 1stDorInt Nml Nml Nml Nml 2- Rapid Many 1+ Many 1+ Many 1+ N/A  Left Abd Poll Brev Nml Nml Nml Nml 2- Rapid Many 1+ Many 1+ Many 1+ N/A  Left PronatorTeres Nml 1+ Nml Nml 2- Rapid Many 1+ Many 1+ Many 1+ N/A  Left Biceps Nml 1+ Nml Nml 2- Rapid Many 1+ Many 1+ Many 1+ N/A  Left Triceps Nml Nml Nml Nml 1- Rapid Some 1+ Some 1+ Some 1+ N/A  Left Deltoid Nml Nml Nml Nml 2- Rapid Many 1+ Many 1+ Many 1+ N/A  Left Mentalis Nml Nml Nml Nml 1- Rapid Some 1+ Some 1+ Few 1+ N/A  Left Hyoglossus Nml 1+ Nml Nml 2- Rapid Many 1+ Many 1+ Many 1+ N/A  Left Cervical Parasp Low Nml Nml Nml Nml NE - - - - - - - N/A  Left Lumbo Parasp Low Nml Nml Nml Nml NE - - - - - - - N/A  Left T6 Parasp Nml Nml Nml Nml NE - - - - - - - N/A  Left T11 Parasp Nml 1+ Nml Nml NE - - - - - - - N/A      Waveforms:

## 2021-11-19 ENCOUNTER — Ambulatory Visit: Payer: Medicare Other | Admitting: Speech Pathology

## 2021-11-19 ENCOUNTER — Encounter: Payer: Self-pay | Admitting: Speech Pathology

## 2021-11-19 DIAGNOSIS — R498 Other voice and resonance disorders: Secondary | ICD-10-CM | POA: Diagnosis not present

## 2021-11-19 DIAGNOSIS — R1310 Dysphagia, unspecified: Secondary | ICD-10-CM

## 2021-11-19 DIAGNOSIS — R471 Dysarthria and anarthria: Secondary | ICD-10-CM

## 2021-11-19 NOTE — Patient Instructions (Addendum)
° ° ° °  Conserve your voice and speech energy - if you have a phone call or an event you will need to talk at, rest your voice and don't talk much prior to the call or event  Eating will also fatigue your voice and speech - try for softer foods which are high in protein and fat  when you need to (Whole fat [place] Yogurt, protein shakes with peanut butter)  Use sauces and gravies to help rice, cornbreads etc or avoids that fall apart  Avoid mixed consistencies which have a liquid and solid component such as soups, cereals, fruit cocktails  Softer foods will use less energy and may help preserved your speech, swallowing and voice  Consider taking your pills with applesauce or yogurt if you start having trouble with pills  To make it easier to talk with less effort  Get the persons attention before you speak  Use eye contact and face the person you are speaking to  Be in close proximity to the person you are speaking to  Turn down any noise in the environment such as the TV, walk away from loud appliances, air conditioners, fans, dish washers etc  When you have to repeat yourself, it is taking more effort and energy - it is best to make the environment quiet and be face to face rather than taking energy to repeat yourself  In large gatherings, sit or stay on the side not the center of the room  Try to sit with a wall behind you or in a corner so noise isn't coming at you from all directions when dining out or attending gatherings  If you have airpods or blue tooth headphones, the Live Listen feature on you iPhone can sync with your pods to act as a microphone to enhance conversation near you or at your table (control center, hearing, AirPods, Live Listen)  Face time is easier than a phone call especially for family calls  [place] job self advertising that you are having trouble talking and to be patient with you

## 2021-11-19 NOTE — Therapy (Signed)
[facility] [address] [place], [place], [address] Phone: [phone]   Fax:  [phone]  Speech Language Pathology Treatment  Patient Details  Name: [name] [name] MRN: [id number] Date of Birth: 05/04/1951 Referring Provider (SLP): [name] Nunnery, [name] (PCP/doc - [name] [name])   Encounter Date: 11/19/2021   End of Session - 11/19/21 1203     Visit Number 8    Number of Visits 25    Date for SLP Re-Evaluation 60/63/01   recert today   Authorization Type [employer] Medicare    SLP Start Time 1100    SLP Stop Time  1135    SLP Time Calculation (min) 35 min    Activity Tolerance Patient tolerated treatment well             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   [name] polyps    [name] polyps at age 35, next Cscope age 86   Diabetes mellitus    DJD (degenerative joint disease)    Glaucoma suspect    Hyperlipidemia     Past Surgical History:  Procedure Laterality Date   [facility] Right [facility]/27/2014   [facility]   BUNIONECTOMY Left    2019   ESOPHAGEAL MANOMETRY N/A 07/23/2021   Procedure: ESOPHAGEAL MANOMETRY (EM);  Surgeon: [name] Corner, [name];  Location: [facility];  Service: Endoscopy;  Laterality: N/A;   NASAL SEPTUM SURGERY  2000   SHOULDER ARTHROSCOPY     L 2011, R 2012    There were no vitals filed for this visit.   Subjective Assessment - 11/19/21 1112     Subjective "They are brining in 2 neurosurgeons"    Currently in Pain? No/denies                   ADULT SLP TREATMENT - 11/19/21 1113       General Information   Behavior/Cognition Alert;Cooperative;Pleasant mood      Treatment Provided   Treatment provided Dysphagia;Cognitive-Linquistic      Dysphagia Treatment   Temperature Spikes Noted No    Respiratory Status Room air    Oral Cavity - Dentition Adequate natural dentition    Treatment Methods Skilled  observation;Compensation strategy training    Patient observed directly with PO's Yes    Type of PO's observed Thin liquids    Feeding Able to feed self    Liquids provided via Cup    Pharyngeal Phase Signs & Symptoms Delayed throat clear    Type of cueing Verbal    Amount of cueing Minimal    Other treatment/comments Reviewed results and recommendations of FEES. Pt verbalized recommendations with verbal and questioning cues. He is aware of reduced laryngeal elevation. Targeted energy consersvation for speech and swallowing. Trained pt in environemental modifications to maximize his intelligilbity and swallowing and reduce muscle fatigue. See pt intstrcutions. Instructed him to educate family on this and to show family his patient instructions.  [name] [name] hopes to have neuro dx in the next few weeks.      Assessment / Recommendations / Plan   Plan Continue with current plan of care   Will see pt 2/10 and 2/17 hopefully with neuro dx             SLP Education - 11/19/21 1156     Education Details energy conservation and envrironmental modifications for speech and swallowing, aviod textures diffcult to manage, take meds with puree if you have difficulty  with meds,    Person(s) Educated Patient    Methods Explanation;Demonstration;Verbal cues;Handout    Comprehension Verbalized understanding;Need further instruction;Verbal cues required              SLP Short Term Goals - 11/19/21 1202       SLP SHORT TERM GOAL #1   Title Pt will complete voice exercise HEP given occasional min A over 3 sessions2    Status Deferred      SLP SHORT TERM GOAL #2   Title Pt will demonstrate increased awareness of throat clearing and use appropriate substitute with 80% accuracy over 3 sessions    Status Deferred      SLP SHORT TERM GOAL #3   Title Pt will utilize abdominal breathing for 18/20 sentence responses given rare min A over 2 sessions    Status Partially Met      SLP SHORT TERM GOAL #4    Title Pt will demonstrate compensations to maximize speech intelligiblity in 5 minute conversation given occasional min A over 2 sessions    Status Partially Met      SLP SHORT TERM GOAL #5   Title Pt will complete bedside swallow evaluation to determine need for instrumental swallow study (as needed)    Status Achieved              SLP Long Term Goals - 11/19/21 1202       SLP LONG TERM GOAL #1   Title Pt will complete voice exercise HEP given rare min A over 3 sessions    Time 8    Period Weeks    Status Deferred      SLP LONG TERM GOAL #2   Title Pt will follow vocal hygiene protocol > 2 weeks given rare min A    Time 8    Period Weeks    Status Deferred      SLP LONG TERM GOAL #3   Title Pt will demonstrate awareness of hoarse voice and utilize abdominal breathing with 90% accuracy over 2 sessions    Time 7    Period Weeks    Status On-going      SLP LONG TERM GOAL #4   Title Pt will demonstrate compensations to maximize speech intelligibility in 15+ minute conversation given occasional min A over 2 sessions    Baseline 11/19/21    Time 7    Period Weeks    Status On-going      SLP LONG TERM GOAL #5   Title Pt will undergo objective swallow study to evaluate pharyngeal function and assess for risk of aspiration as needed    Time 8    Period Weeks    Status Achieved      Additional Long Term Goals   Additional Long Term Goals Yes      SLP LONG TERM GOAL #6   Title Pt will carryover 2 strategies/diet modificiations to conserve energy with meals/PO intake over 2 sessions    Time 8    Period Weeks    Status New      SLP LONG TERM GOAL #7   Title Pt will carryover 3 environmental modifications and energy conservation strategies to reduce need for repetition and maximize ease of communication    Time 8    Period Weeks    Status New              Plan - 11/19/21 1157     Clinical Impression Statement [name] [name] continues to present  with dysarthria, dysphonia  and dysphagia which worses with exercise/use. Intelligilbity is affected. FEES completed, [place] savini results and recommendations of FEES. Ongoing training in energy conservation for speech and swallowing as well as environmental and diet modifications to maximize ease of speech and swallow and intelligibility. He is seeing neurosurgeon for meningioma and continues to see neuro for w/u of his symptoms.  As no neuro dx yet, will see pt 2/10 and 2/17 to f/u. When neuro dx received. Goals moficied, recert completed.    Speech Therapy Frequency 2x / week    Duration 12 weeks    Treatment/Interventions SLP instruction and feedback;Compensatory techniques;Functional tasks;Patient/family education;Compensatory strategies;Aspiration precaution training;Diet toleration management by SLP    Potential to Achieve Goals Fair    Potential Considerations Other (comment)   unknown etiology, await neuro dx.            Patient will benefit from skilled therapeutic intervention in order to improve the following deficits and impairments:   Dysphagia, unspecified type  Dysarthria and anarthria    Problem List Patient Active Problem List   Diagnosis Date Noted   Gastroesophageal reflux disease 10/08/2021   Esophageal erosions 10/08/2021   Asymptomatic PVCs 06/02/2021   Essential hypertension 06/02/2021   Coronary artery disease involving native coronary artery of native heart without angina pectoris 06/02/2021   Atherosclerosis of aorta ([place]) 06/02/2021   Chronic obstructive pulmonary disease ([place]) 02/18/2021   Allergic rhinitis 02/06/2020   Glaucoma suspect 10/02/2019   PCP NOTES >>> 08/07/2015   Hypothyroidism 05/01/2015   Neuroma of foot 03/29/2015   Status post right foot surgery 01/22/2015   Plantar fasciitis of right foot 12/03/2014   Metatarsal deformity 12/03/2014   Equinus deformity of foot, acquired 12/03/2014   Bunion 12/03/2014   Annual physical exam 08/04/2012   Erectile  dysfunction 08/04/2012   Type 2 diabetes mellitus with microalbuminuria, without long-term current use of insulin ([place]) 05/04/2012   Hyperlipidemia 05/04/2012   DJD (degenerative joint disease) 05/04/2012   Bradycardia 05/04/2012    Javarion Douty, Annye Rusk, CCC-SLP 11/19/2021, 12:08 PM  [facility] [address] [place], [place], [address] Phone: [phone]   Fax:  [phone]   Name: CALLAHAN WILD MRN: [id number] Date of Birth: 03-Oct-1951

## 2021-11-20 ENCOUNTER — Encounter: Payer: Self-pay | Admitting: Neurology

## 2021-11-25 ENCOUNTER — Encounter: Payer: Medicare Other | Admitting: Neurology

## 2021-11-25 ENCOUNTER — Encounter: Payer: Self-pay | Admitting: Internal Medicine

## 2021-11-25 LAB — LYME DISEASE, WESTERN BLOT
IgG P18 Ab.: ABSENT
IgG P23 Ab.: ABSENT
IgG P28 Ab.: ABSENT
IgG P30 Ab.: ABSENT
IgG P39 Ab.: ABSENT
IgG P45 Ab.: ABSENT
IgG P58 Ab.: ABSENT
IgG P66 Ab.: ABSENT
IgG P93 Ab.: ABSENT
IgM P23 Ab.: ABSENT
IgM P39 Ab.: ABSENT
IgM P41 Ab.: ABSENT
Lyme IgG Wb: NEGATIVE
Lyme IgM Wb: NEGATIVE

## 2021-11-25 LAB — PROTEIN ELECTROPHORESIS, SERUM
Albumin ELP: 3.9 g/dL (ref 3.8–4.8)
Alpha 1: 0.3 g/dL (ref 0.2–0.3)
Alpha 2: 0.7 g/dL (ref 0.5–0.9)
Beta 2: 0.3 g/dL (ref 0.2–0.5)
Beta Globulin: 0.4 g/dL (ref 0.4–0.6)
Gamma Globulin: 0.8 g/dL (ref 0.8–1.7)
Total Protein: 6.4 g/dL (ref 6.1–8.1)

## 2021-11-25 LAB — PROTEIN ELECTROPHORESIS, URINE REFLEX
Albumin ELP, Urine: 100 %
Alpha-1-Globulin, U: 0 %
Alpha-2-Globulin, U: 0 %
Beta Globulin, U: 0 %
Gamma Globulin, U: 0 %
Protein, Ur: <4 mg/dL

## 2021-11-25 LAB — GM1 IGG AUTOANTIBODIES: GM1 IgG Autoantibodies: <20 % (ref 0–30)

## 2021-11-25 LAB — COPPER, SERUM: Copper: 86 ug/dL (ref 70–175)

## 2021-11-25 LAB — INTERPRETATION

## 2021-11-25 LAB — CERULOPLASMIN: Ceruloplasmin: 21 mg/dL (ref 18–36)

## 2021-11-25 LAB — ZINC: Zinc: 83 ug/dL (ref 60–130)

## 2021-11-25 LAB — IMMUNOFIXATION, SERUM
IgA/Immunoglobulin A, Serum: 145 mg/dL (ref 61–437)
IgG (Immunoglobin G), Serum: 808 mg/dL (ref 603–1613)
IgM (Immunoglobulin M), Srm: 31 mg/dL (ref 20–172)

## 2021-11-25 LAB — HTLV I/II DNA, QUAL. REAL TIME PCR
HTLV-I DNA: NOT DETECTED
HTLV-II DNA: NOT DETECTED

## 2021-11-25 LAB — PARATHYROID HORMONE, INTACT (NO CA): PTH: 44 pg/mL (ref 16–77)

## 2021-11-26 NOTE — Progress Notes (Signed)
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  He will get back to me regarding where he would like the referral.    40 minutes spent reviewing test results, discussing diagnosis and test results as well as plan going forward.     Subjective:  [name] [name] is a 71 year old male with DM II and HLD who follows up to discuss test results.  MRI of brain and spinal cord personally reviewed.  UPDATE: MRI of brain with and without contrast on 10/22/2021 demonstrated 1.9 x 1.8 x 1.4 cm meningioma on the floor of the right middle cranial fossa projecting into the right temporal lobe.  MRI of thoracic spine with and without contrast was unremarkable.  MRI of cervical spine with and without contrast on 10/24/2021 showed degenerative spine changes but no spinal cord abnormalities.  He has an appointment to see neurosurgery regarding the meningioma  Pulmonary function test on 10/22/2022 was consistent with asthma and was prescribed bronchodilators.    Labs from 11/18/2021 were unremarkable, including negative GM 1 IgG antibodies, Lyme Western Blot, SPEP/IFE/UPEP, HTLV I/II PCR, PTH 44, copper 86, ceruloplasmin 21, zinc 83, B12 327, TSH 2.32 and CK 180.  NCV-EMG on 11/13/2021 showed chronic motor axonal loss and active denervation in the hypoglossus muscle, left upper extremity and left lower extremity as well as fibrillation potentials at the mid-thoracic paraspinal level at T11.  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  He then started having trouble swallowing, such as choking on rice  or peanuts.  He had an upper GI endoscopy which demonstrated an ulcer and was started on omeprazole.  However symptoms persisted.  He had a modified barium swallow in June which demonstrated small hiatal hernia with trace GERD but no laryngeal penetration, esophageal strictures or other abnormalities of the esophagus.  He was evaluated by ENT.  Fiberoptic laryngoscopy in September was negative.  The otolaryngologist thought that the dysphagia and hoarseness may be related to GERD.  However, in September, he began noticing left lower extremity weakness.  He felt cramping in his left calf and had trouble moving/picking up his left foot.  He had trouble lifting his leg in order to put on pants.  He reports having vascular studies performed which were unremarkable.  He feels off-balance.  He began experiencing left hand weakness as well.  More recently, he has started noticing some trace weakness in his right hand.  No radicular pain or numbness of the extremities, although he does have DM II.  He started feeling short of breath.  Denies double vision or change in bowel/bladder function.  PAST MEDICAL HISTORY: 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   Colon polyps    Colon polyps at age 86, next Cscope age 5   Diabetes mellitus    DJD (degenerative joint disease)    Glaucoma suspect    Hyperlipidemia     MEDICATIONS: Current Outpatient Medications on File Prior to Visit  Medication Sig Dispense Refill   Accu-Chek Softclix Lancets lancets Use as instructed 100 each 12   aspirin 81 MG tablet Take  81 mg by mouth daily.     atorvastatin (LIPITOR) 40 MG tablet TAKE 1 TABLET BY MOUTH  DAILY 90 tablet 1   azelastine (ASTELIN) 0.1 % nasal spray Place 2 sprays into both nostrils at bedtime. 30 mL 6   Blood Glucose Monitoring Suppl (ACCU-CHEK AVIVA PLUS) w/Device KIT Use to check blood sugar 1 kit 0   budesonide-formoterol (SYMBICORT) 160-4.5 MCG/ACT inhaler Inhale 1 puff into the  lungs 2 (two) times daily. 10.2 g 5   empagliflozin (JARDIANCE) 10 MG TABS tablet Take 1 tablet (10 mg total) by mouth daily before breakfast. 30 tablet 6   fish oil-omega-3 fatty acids 1000 MG capsule Take 2 g by mouth daily.     fluticasone (FLONASE) 50 MCG/ACT nasal spray Place into both nostrils daily.     glucose blood (ACCU-CHEK AVIVA) test strip Check blood sugar no more than twice daily 100 each 12   levothyroxine (SYNTHROID) 88 MCG tablet TAKE 1 TABLET BY MOUTH  DAILY BEFORE BREAKFAST 90 tablet 1   lisinopril (ZESTRIL) 2.5 MG tablet TAKE 1 TABLET BY MOUTH  DAILY 90 tablet 1   Melatonin 1 MG TABS Take 1 mg by mouth daily.     metFORMIN (GLUCOPHAGE-XR) 500 MG 24 hr tablet TAKE 2 TABLETS BY MOUTH  TWICE DAILY 360 tablet 1   metoprolol tartrate (LOPRESSOR) 25 MG tablet TAKE 1 TABLET(25 MG) BY MOUTH TWICE DAILY 180 tablet 1   Multiple Vitamin (MULTIVITAMIN) tablet Take 1 tablet by mouth daily.     mupirocin cream (BACTROBAN) 2 % Apply 1 application topically as needed. Reported on 01/27/2016     omeprazole (PRILOSEC) 40 MG capsule SMARTSIG:1 Capsule(s) By Mouth Morning-Evening     sildenafil (REVATIO) 20 MG tablet TAKE 2 TO 3 TABLETS BY MOUTH ONCE DAILY AS NEEDED 50 tablet 2   triamcinolone cream (KENALOG) 0.1 %   2   No current facility-administered medications on file prior to visit.    ALLERGIES: No Known Allergies  FAMILY HISTORY: Family History  Problem Relation Age of Onset   Arthritis Mother    Alzheimer's disease Mother    Lung cancer Father    Stroke Other    Diabetes Other        uncle, brother (borderline)   GER disease Sister    Colon cancer Neg Hx    Prostate cancer Neg Hx    CAD Neg Hx       Objective:  Blood pressure 124/67, pulse (!) 56, height _0  (1.93 m), weight 250 lb 12.8 oz (113.8 kg), SpO2 99 %. General: No acute distress.  Patient appears well-groomed.    Metta Clines, DO  CC: [name] November, MD

## 2021-11-27 ENCOUNTER — Ambulatory Visit: Payer: Medicare Other | Admitting: Neurology

## 2021-11-27 ENCOUNTER — Other Ambulatory Visit: Payer: Self-pay

## 2021-11-27 ENCOUNTER — Encounter: Payer: Self-pay | Admitting: Neurology

## 2021-11-27 VITALS — BP 124/67 | HR 56 | Ht 76.0 in | Wt 250.8 lb

## 2021-11-27 DIAGNOSIS — G1221 Amyotrophic lateral sclerosis: Secondary | ICD-10-CM

## 2021-11-27 DIAGNOSIS — D320 Benign neoplasm of cerebral meninges: Secondary | ICD-10-CM

## 2021-11-27 NOTE — Patient Instructions (Signed)
Please let me know if you would like me to refer you to [facility] or to [facility]  Amyotrophic Lateral Sclerosis Amyotrophic lateral sclerosis (ALS), also called Leverne Humbles disease, is a disease of the brain and spinal cord (nervous system). ALS causes a gradual loss of the nerve cells, or neurons, that move voluntary muscles. Voluntary muscles are the muscles that a person can control, such as muscles in the arms, legs, and lungs. In ALS, neuron loss causes the muscles to eventually stop working and to waste away (atrophy). ALS is a condition that gets worse over time (is progressive) and eventually leads to death. It can affect personality and memory and lead to a condition called frontotemporal dementia. There is no cure for ALS, but treatment can help you live longer and improve your quality of life. What are the causes? The cause of most cases of ALS is not known. In a small number of cases, ALS is passed from parent to child (inherited). What increases the risk? The following factors may make you more likely to develop this condition: Having a family history of ALS. Being male. Being 22-21 years old. Smoking. What are the signs or symptoms? Signs and symptoms of ALS start slowly. Early symptoms may include: Weakness in the arms or legs. Slurred speech. Muscle twitches. Trouble swallowing. Cramps. Sudden muscle tightening, or spasms. Over time, signs and symptoms become more noticeable. These may be: Physical symptoms. These include: Muscle atrophy. Muscles may look smaller than normal. Being unable to speak or swallow. Trouble breathing. Being unable to move your arms or legs. Weight loss. Excessive drooling. Mental and emotional symptoms. These include: Laughing or crying that cannot be controlled (pseudobulbar affect). Anxiety and depression. Changes in behavior. Problems with thinking and memory. How is this diagnosed? There is no test to diagnose this condition, but  testing can help rule out other possible causes of your symptoms. This condition may be diagnosed based on: Your symptoms. ALS may be diagnosed if symptoms get worse over time and involve both the neurons in your brain (upper motor neurons) and neurons in your spinal cord (lower motor neurons). A neurologic exam in which your strength, reflexes, sensation, and coordination are tested. Nerve conduction studies and electromyogram. These are tests that record muscle activity and check how well your nerves send signals. Imaging studies of your brain and spinal cord, such as an MRI or a CT scan. Blood tests. Genetic testing, if you have a family history of ALS. How is this treated? There is no cure for this condition, but treatment can slow the progression of the disease and improve your quality of life. You may work with a team of health care providers that includes therapists, nutrition specialists, and social workers. Treatment may include: Medicines. These can: Slow the progression of the disease. Reduce muscle cramps or spasms. Relieve anxiety, depression, or pseudobulbar affect. Therapy to improve quality of life. This may include: Physical therapy exercises to help keep your muscles flexible. Occupational therapy to help with everyday activities, such as eating and dressing. Speech therapy to help with communication and to make sure you can swallow safely. Devices to help you breathe more easily and devices to help you move around. A feeding tube to supplement your diet and prevent weight loss. Follow these instructions at home: Working with your health care providers  Work with your team of health care providers to make a plan for home care that meets your needs. Your needs may change over time.  If you have symptoms of anxiety or depression, work with a mental health care provider. Your health care provider may recommend working with a social worker to make sure you have the support you  need at home. Safety Make your home safe and easy for you to get around, and take actions to prevent falls. If possible: Remove throw rugs and other tripping hazards from the floor. Lower beds to make it easier to get in and out of them. Add a chair or bench to the shower. Install grab bars for your tub, shower, and toilet. Widen doors and install a wheelchair ramp. Use devices to help you move around as told by your health care provider. Have a safety plan for emergencies, such as a safety alert system to get help quickly. Activity Exercise daily, if you are able. Work with a physical therapist to make an exercise program that includes: Stretching and range-of-motion exercises. Aerobic exercises, such as swimming or walking. These exercises can help strengthen muscles. Work with your occupational therapist to learn techniques for doing tasks that are difficult because of your condition. General instructions  Take over-the-counter and prescription medicines only as told by your health care provider. Avoid drinking alcohol. Do not use any products that contain nicotine or tobacco. These products include cigarettes, chewing tobacco, and vaping devices, such as e-cigarettes. If you need help quitting, ask your health care provider. Work with a Microbiologist to maintain a healthy diet. Make sure you have a strong, ongoing support system and care at home. Ask your health care provider what resources may be needed. Keep all follow-up visits. This is important. Where to find more information ALS Association: www.alsa.org Muscular Dystrophy Association: CallRank.tn Contact a health care provider if: You have a fever. You cannot care for yourself at home. You have symptoms of anxiety or depression. Get help right away if: You cannot swallow foods or liquids. You choke on foods or liquids. You have trouble breathing. These symptoms may represent a serious problem that is an emergency. Do not wait  to see if the symptoms will go away. Get medical help right away. Call your local emergency services (911 in the [place] Do not drive yourself to the hospital. If you ever feel like you may hurt yourself or others, or have thoughts about taking your own life, get help right away. Go to your nearest emergency department or: Call your local emergency services (911 in the [place] Call a suicide crisis helpline, such as the [place] at 202-086-1103 or 988 in the [place]. This is open 24 hours a day in the [place] Text the Crisis Text Line at 2568314454 (in the [place].). Summary Amyotrophic lateral sclerosis (ALS) is a disease of the nerves that control voluntary muscles. ALS leads to weakness in the arms and legs, slurred speech, problems swallowing, and difficulty breathing. Treatment may include medicines, therapy to improve quality of life, and devices to help you breathe and move more easily. Make sure you have a strong, ongoing support system and care at home. Ask your health care provider what resources may be needed as your condition progresses. This information is not intended to replace advice given to you by your health care provider. Make sure you discuss any questions you have with your health care provider. Document Revised: 05/07/2021 Document Reviewed: 02/17/2021 Elsevier Patient Education  2022 [employer].

## 2021-11-28 ENCOUNTER — Ambulatory Visit: Payer: Medicare Other | Admitting: Neurology

## 2021-12-01 ENCOUNTER — Ambulatory Visit (INDEPENDENT_AMBULATORY_CARE_PROVIDER_SITE_OTHER): Payer: Medicare Other | Admitting: Internal Medicine

## 2021-12-01 ENCOUNTER — Other Ambulatory Visit: Payer: Self-pay

## 2021-12-01 ENCOUNTER — Encounter: Payer: Self-pay | Admitting: Neurology

## 2021-12-01 ENCOUNTER — Encounter: Payer: Self-pay | Admitting: Internal Medicine

## 2021-12-01 VITALS — BP 126/70 | HR 55 | Temp 98.0°F | Resp 18 | Ht 76.0 in | Wt 250.5 lb

## 2021-12-01 DIAGNOSIS — E119 Type 2 diabetes mellitus without complications: Secondary | ICD-10-CM | POA: Diagnosis not present

## 2021-12-01 DIAGNOSIS — I10 Essential (primary) hypertension: Secondary | ICD-10-CM | POA: Diagnosis not present

## 2021-12-01 DIAGNOSIS — E1129 Type 2 diabetes mellitus with other diabetic kidney complication: Secondary | ICD-10-CM

## 2021-12-01 DIAGNOSIS — E785 Hyperlipidemia, unspecified: Secondary | ICD-10-CM | POA: Diagnosis not present

## 2021-12-01 DIAGNOSIS — G1221 Amyotrophic lateral sclerosis: Secondary | ICD-10-CM

## 2021-12-01 DIAGNOSIS — R809 Proteinuria, unspecified: Secondary | ICD-10-CM | POA: Diagnosis not present

## 2021-12-01 LAB — LIPID PANEL
Cholesterol: 155 mg/dL (ref 0–200)
HDL: 56.8 mg/dL (ref >39.00)
LDL Cholesterol: 85 mg/dL (ref 0–99)
NonHDL: 98.57
Total CHOL/HDL Ratio: 3
Triglycerides: 67 mg/dL (ref 0.0–149.0)
VLDL: 13.4 mg/dL (ref 0.0–40.0)

## 2021-12-01 LAB — BASIC METABOLIC PANEL
BUN: 21 mg/dL (ref 6–23)
CO2: 31 mEq/L (ref 19–32)
Calcium: 9.4 mg/dL (ref 8.4–10.5)
Chloride: 97 mEq/L (ref 96–112)
Creatinine, Ser: 1.35 mg/dL (ref 0.40–1.50)
GFR: 53.62 mL/min — ABNORMAL LOW (ref >60.00)
Glucose, Bld: 100 mg/dL — ABNORMAL HIGH (ref 70–99)
Potassium: 4.4 mEq/L (ref 3.5–5.1)
Sodium: 134 mEq/L — ABNORMAL LOW (ref 135–145)

## 2021-12-01 LAB — HEMOGLOBIN A1C: Hgb A1c MFr Bld: 6.4 % (ref 4.6–6.5)

## 2021-12-01 LAB — HM DIABETES EYE EXAM

## 2021-12-01 MED ORDER — EMPAGLIFLOZIN 10 MG PO TABS: 10.0000 mg | ORAL_TABLET | Freq: Every day | ORAL | 1 refills | Status: DC

## 2021-12-01 MED ORDER — METOPROLOL TARTRATE 25 MG PO TABS: ORAL_TABLET | ORAL | 1 refills | Status: DC

## 2021-12-01 NOTE — Patient Instructions (Addendum)
Per our records you are due for your diabetic eye exam. Please contact your eye doctor to schedule an appointment. Please have them send copies of your office visit notes to [place]. Our fax number is [phone]. If you need a referral to an eye doctor please let us know.   Continue same medications.  Okay to change Symbicort to "as needed" for cough, wheezing.   GO TO THE LAB : Get the blood work     GO TO THE FRONT DESK, [place] back for   a checkup in 4 to 5 months

## 2021-12-01 NOTE — Progress Notes (Addendum)
Subjective:    Patient ID: [name] [name], male    DOB: 05/06/1951, 71 y.o.   MRN: [id number]  DOS:  12/01/2021 Type of visit - description: f/u Here for routine visit. We addressed his chronic medical problems. Also, he was recently Dx with ALS.   Review of Systems Denies chest pain or difficulty breathing Today reports no classic claudication but leg cramps on and off.  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   Colon polyps    Colon polyps at age 69, next Cscope age 44   Diabetes mellitus    DJD (degenerative joint disease)    Glaucoma suspect    Hyperlipidemia     Past Surgical History:  Procedure Laterality Date   BUNIONECTOMY Right 12/27/2014   [facility]   BUNIONECTOMY Left    2019   ESOPHAGEAL MANOMETRY N/A 07/23/2021   Procedure: ESOPHAGEAL MANOMETRY (EM);  Surgeon: Wilford Corner, MD;  Location: [facility];  Service: Endoscopy;  Laterality: N/A;   NASAL SEPTUM SURGERY  2000   SHOULDER ARTHROSCOPY     L 2011, R 2012    Current Outpatient Medications  Medication Instructions   Accu-Chek Softclix Lancets lancets Use as instructed   aspirin 81 mg, Daily   atorvastatin (LIPITOR) 40 MG tablet TAKE 1 TABLET BY MOUTH  DAILY   azelastine (ASTELIN) 0.1 % nasal spray 2 sprays, Each Nare, Daily at bedtime   Blood Glucose Monitoring Suppl (ACCU-CHEK AVIVA PLUS) w/Device KIT Use to check blood sugar   budesonide-formoterol (SYMBICORT) 160-4.5 MCG/ACT inhaler 1 puff, Inhalation, 2 times daily   empagliflozin (JARDIANCE) 10 mg, Oral, Daily before breakfast   fish oil-omega-3 fatty acids 2 g, Daily   fluticasone (FLONASE) 50 MCG/ACT nasal spray Each Nare, Daily   glucose blood (ACCU-CHEK AVIVA) test strip Check blood sugar no more than twice daily   levothyroxine (SYNTHROID) 88 MCG tablet TAKE 1 TABLET BY MOUTH  DAILY BEFORE BREAKFAST   lisinopril (ZESTRIL) 2.5 MG tablet TAKE 1 TABLET BY MOUTH  DAILY   melatonin 1 mg, Oral, Daily    metFORMIN (GLUCOPHAGE-XR) 500 MG 24 hr tablet TAKE 2 TABLETS BY MOUTH  TWICE DAILY   metoprolol tartrate (LOPRESSOR) 25 MG tablet TAKE 1 TABLET(25 MG) BY MOUTH TWICE DAILY   Multiple Vitamin (MULTIVITAMIN) tablet 1 tablet, Daily   mupirocin cream (BACTROBAN) 2 % 1 application, Topical, As needed, Reported on 01/27/2016   omeprazole (PRILOSEC) 40 MG capsule SMARTSIG:1 Capsule(s) By Mouth Morning-Evening   sildenafil (REVATIO) 20 MG tablet TAKE 2 TO 3 TABLETS BY MOUTH ONCE DAILY AS NEEDED   triamcinolone cream (KENALOG) 0.1 % No dose, route, or frequency recorded.       Objective:   Physical Exam BP 126/70 (BP Location: Left Arm, Patient Position: Sitting, Cuff Size: Normal)    Pulse (!) 55    Temp 98 F (36.7 C) (Oral)    Resp 18    Ht '6\' 4"'  (1.93 m)    Wt 250 lb 8 oz (113.6 kg)    SpO2 98%    BMI 30.49 kg/m  General:   Well developed, NAD, BMI noted. HEENT:  Normocephalic . Face symmetric, atraumatic. He is much more hoarse.  Speech is becoming difficult and slow. Lungs:  CTA B Normal respiratory effort, no intercostal retractions, no accessory muscle use. Heart: RRR,  no murmur.  Lower extremities: no pretibial edema bilaterally  Skin: Not pale. Not jaundice Neurologic:  alert & oriented X3.  Speech normal,  gait appropriate for age and unassisted Psych--  Cognition and judgment appear intact.  Cooperative with normal attention span and concentration.  Behavior appropriate. No anxious or depressed appearing.      Assessment     Assessment DM- on ACEi Hyperlipidemia Hypothyroidism DJD Pre-glaucoma E.D. Chronic R foot pain, s/p surgery w/ podiatry 2016 Pulmonary: --COPD per CT for lung ca screening --Asthma.  PFTs 09/2021 CV: --CAD: Coronary Ca+ noted on c  CT scan chest 10-2020 -- PVCs- on BB  PLAN:  ALS: Due to hoarseness, dysphagia was referred to neurology, after blood work and nerve conduction study the dx is ALS, [facility]. Has  seen speech therapy. Handicap parking signed DM: RF Jardiance, cont  lisinopril, metformin.  Check BMP, A1c. Hyperlipidemia: On Lipitor.  Check FLP.  LDL goal less than 70 given CAD: No symptoms, saw cardiology August 2022 next visit August 2024 Claudication?  Today reports leg cramps, ABIs 08-2021 were normal. COPD, asthma: COPD seen on CT, asthma per PFTs 10/22/2021. He is doing well on Symbicort, okay to change to as needed only. Preventive care: Up-to-date on COVID and flu vaccine.  RTC 4-5 m  This visit occurred during the SARS-CoV-2 public health emergency.  Safety protocols were in place, including screening questions prior to the visit, additional usage of staff PPE, and extensive cleaning of exam room while observing appropriate contact time as indicated for disinfecting solutions.

## 2021-12-01 NOTE — Assessment & Plan Note (Addendum)
ALS: Due to hoarseness, dysphagia was referred to neurology, after blood work and nerve conduction study the dx is ALS, [facility] Has seen speech therapy. Handicap parking signed DM: RF Jardiance, cont  lisinopril, metformin.  Check BMP, A1c. Hyperlipidemia: On Lipitor.  Check FLP.  LDL goal less than 70 given CAD: No symptoms, saw cardiology August 2022 next visit August 2024 Claudication?  Today reports leg cramps, ABIs 08-2021 were normal. COPD, asthma: COPD seen on CT, asthma per PFTs 10/22/2021. He is doing well on Symbicort, okay to change to as needed only. Preventive care: Up-to-date on COVID and flu vaccine.  RTC 4-5 m

## 2021-12-03 ENCOUNTER — Encounter: Payer: Self-pay | Admitting: Internal Medicine

## 2021-12-04 DIAGNOSIS — D329 Benign neoplasm of meninges, unspecified: Secondary | ICD-10-CM | POA: Diagnosis not present

## 2021-12-05 ENCOUNTER — Other Ambulatory Visit: Payer: Self-pay

## 2021-12-05 ENCOUNTER — Ambulatory Visit: Payer: Medicare Other | Attending: Otolaryngology

## 2021-12-05 DIAGNOSIS — R1310 Dysphagia, unspecified: Secondary | ICD-10-CM | POA: Insufficient documentation

## 2021-12-05 DIAGNOSIS — R498 Other voice and resonance disorders: Secondary | ICD-10-CM | POA: Insufficient documentation

## 2021-12-05 DIAGNOSIS — R471 Dysarthria and anarthria: Secondary | ICD-10-CM | POA: Insufficient documentation

## 2021-12-05 NOTE — Therapy (Signed)
[facility] [address] [place], [place], [address] Phone: [phone]   Fax:  [phone]  Speech Language Pathology Treatment  Patient Details  Name: [name] [name] MRN: [id number] Date of Birth: 05/21/1951 Referring Provider (SLP): Rozetta Nunnery, MD (PCP/doc - Colon Branch MD)   Encounter Date: 12/05/2021   End of Session - 12/05/21 1539     Visit Number 9    Number of Visits 25    Date for SLP Re-Evaluation 24/58/09   recert today   Authorization Type UHC Medicare    SLP Start Time 1318    SLP Stop Time  1358    SLP Time Calculation (min) 40 min    Activity Tolerance Patient tolerated treatment well             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   Colon polyps    Colon polyps at age 12, next Cscope age 9   Diabetes mellitus    DJD (degenerative joint disease)    Glaucoma suspect    Hyperlipidemia     Past Surgical History:  Procedure Laterality Date   BUN[facility]OMY [facility] 12[facility]27/2014   [facility]   BUNIONECTOMY Left    2019   ESOPHAGEAL MANOMETRY N/A 07/23/2021   Procedure: ESOPHAGEAL MANOMETRY (EM);  Surgeon: Wilford Corner, MD;  Location: [facility];  Service: Endoscopy;  Laterality: N/A;   NASAL SEPTUM SURGERY  2000   SHOULDER ARTHROSCOPY     L 2011, R 2012    There were no vitals filed for this visit.          ADULT SLP TREATMENT - 12/05/21 1531       General Information   Behavior/Cognition Alert;Pleasant mood      Treatment Provided   Treatment provided Cognitive-Linquistic      Dysphagia Treatment   Temperature Spikes Noted No    Respiratory Status Room air    Patient observed directly with PO's Yes    Type of PO's observed Thin liquids    Liquids provided via --   bottle   Oral Phase Signs & Symptoms --   none noted   Pharyngeal Phase Signs & Symptoms --   none noted in 5 sips today   Other treatment/comments see  below about precautions      Cognitive-Linquistic Treatment   Treatment focused on Patient/family/caregiver education;Dysarthria    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 SLP referred tp to ALS.org website, and the page about voice banking on ALS.org site and explained voice banking. SLP explained an oral obturator and asked him to inquire about this at [facility] evaluation in March. SLP explained that they could work with pt to shape speech to be as intelligible as possible but strongly suggested pt make a decision about voice banking in the next 1-3 weeks so that if he desires he can get started with this due to degenerative nature of speech clarity. Lastly SLP explained about swallowing deficits. Pt told SLP that he was swallowing "fine" currently and was adhering to precautions of chin down, alternate bite/sip. SLP reiterated to tpt that he could obtain thickener powder and add to make liquids between thin and a tomatojuice consistency in order to make drinking safer, if he should begin to cough consistently with liquids. Pt acknowledged understanding. SLP again provided overt s/sx of aspiration PNA and discussed these with pt. He will call the clinic to make  his next appointment near the date of the evaluation at [facility].   °  ° Assessment / Recommendations / Plan  ° Plan Other (Comment)   pt to call this clinic to make next appointment  °  ° Progression Toward Goals  ° Progression toward goals Progressing toward goals   ° °  °  ° °  ° ° ° SLP Education - 12/05/21 1539   ° ° Education Details see daily note   ° Person(s) Educated Patient   ° Methods Explanation;Handout   ° Comprehension Verbalized understanding   ° °  °  ° °  ° ° ° SLP Short Term Goals - 11/19/21 1202   ° °  ° SLP SHORT TERM GOAL #1  ° Title Pt will complete voice exercise HEP given occasional min A over 3 sessions2   ° Status Deferred   °  ° SLP SHORT TERM GOAL #2  ° Title Pt will  demonstrate increased awareness of throat clearing and use appropriate substitute with 80% accuracy over 3 sessions   ° Status Deferred   °  ° SLP SHORT TERM GOAL #3  ° Title Pt will utilize abdominal breathing for 18/20 sentence responses given rare min A over 2 sessions   ° Status Partially Met   °  ° SLP SHORT TERM GOAL #4  ° Title Pt will demonstrate compensations to maximize speech intelligiblity in 5 minute conversation given occasional min A over 2 sessions   ° Status Partially Met   °  ° SLP SHORT TERM GOAL #5  ° Title Pt will complete bedside swallow evaluation to determine need for instrumental swallow study (as needed)   ° Status Achieved   ° °  °  ° °  ° ° ° SLP Long Term Goals - 12/05/21 1542   ° °  ° SLP LONG TERM GOAL #1  ° Title Pt will complete voice exercise HEP given rare min A over 3 sessions   ° Time 8   ° Period Weeks   ° Status Deferred   °  ° SLP LONG TERM GOAL #2  ° Title Pt will follow vocal hygiene protocol > 2 weeks given rare min A   ° Time 8   ° Period Weeks   ° Status Deferred   °  ° SLP LONG TERM GOAL #3  ° Title Pt will demonstrate awareness of hoarse voice and utilize abdominal breathing with 90% accuracy over 2 sessions   ° Time 6   ° Period Weeks   ° Status On-going   °  ° SLP LONG TERM GOAL #4  ° Title Pt will demonstrate compensations to maximize speech intelligibility in 15+ minute conversation given occasional min A over 2 sessions   ° Baseline 11/19/21   ° Time 6   ° Period Weeks   ° Status On-going   °  ° SLP LONG TERM GOAL #5  ° Title Pt will undergo objective swallow study to evaluate pharyngeal function and assess for risk of aspiration as needed   ° Time 8   ° Period Weeks   ° Status Achieved   °  ° SLP LONG TERM GOAL #6  ° Title Pt will carryover 2 strategies/diet modificiations to conserve energy with meals/PO intake over 2 sessions   ° Time 7   ° Period Weeks   ° Status On-going   °  ° SLP LONG TERM GOAL #7  ° Title Pt will carryover 3 environmental modifications and    energy conservation strategies to reduce need for repetition and maximize ease of communication   ° Time 7   ° Period Weeks   ° Status On-going   ° °  °  ° °  ° ° ° Plan - 12/05/21 1540   ° ° Clinical Impression Statement [name] [name] continues to present with dysarthria, dysphonia and dysphagia which worses with exercise/use. Diagnosis made of ALS, is awaiting appointmetn for 2nd opinion at [facility]. Intelligilbity is affected. FEES completed, Ongoing training in energy conservation for speech and swallowing as well as environmental and diet modifications to maximize ease of speech and swallow and intelligibility, and how SLP will integrate into pt's plan of care with ALS dx.  Pt will call clinic andwill make next appointmetn sometime nearer to consult at [facility].   ° Speech Therapy Frequency 2x / week   ° Duration 12 weeks   ° Treatment/Interventions SLP instruction and feedback;Compensatory techniques;Functional tasks;Patient/family education;Compensatory strategies;Aspiration precaution training;Diet toleration management by SLP   ° Potential to Achieve Goals Fair   ° Potential Considerations Other (comment)   unknown etiology, await neuro dx.  ° °  °  ° °  ° ° °Patient will benefit from skilled therapeutic intervention in order to improve the following deficits and impairments:   °Dysphagia, unspecified type ° °Dysarthria and anarthria ° °Other voice and resonance disorders ° ° ° °Problem List °Patient Active Problem List  ° Diagnosis Date Noted  ° ALS (amyotrophic lateral sclerosis) (HCC) 12/01/2021  ° Gastroesophageal reflux disease 10/08/2021  ° Esophageal erosions 10/08/2021  ° Asymptomatic PVCs 06/02/2021  ° Essential hypertension 06/02/2021  ° Coronary artery disease involving native coronary artery of native heart without angina pectoris 06/02/2021  ° Atherosclerosis of aorta (HCC) 06/02/2021  ° Chronic obstructive pulmonary disease (HCC) 02/18/2021  ° Allergic rhinitis 02/06/2020  ° Glaucoma suspect 10/02/2019  °  PCP NOTES >>> 08/07/2015  ° Hypothyroidism 05/01/2015  ° Neuroma of foot 03/29/2015  ° Status post right foot surgery 01/22/2015  ° Plantar fasciitis of right foot 12/03/2014  ° Metatarsal deformity 12/03/2014  ° Equinus deformity of foot, acquired 12/03/2014  ° Bunion 12/03/2014  ° Annual physical exam 08/04/2012  ° Erectile dysfunction 08/04/2012  ° Type 2 diabetes mellitus with microalbuminuria, without long-term current use of insulin (HCC) 05/04/2012  ° Hyperlipidemia 05/04/2012  ° DJD (degenerative joint disease) 05/04/2012  ° Bradycardia 05/04/2012  ° ° °SCHINKE,Bayan, CCC-SLP °12/05/2021, 3:44 PM ° °[facility] °[facility] °[address] °[place], [place], [address] °Phone: [phone]   Fax:  [phone] ° ° °Name: [name] [name] °MRN: [id number] °Date of Birth: 07/24/1951 ° °

## 2021-12-12 ENCOUNTER — Ambulatory Visit: Payer: Medicare Other

## 2021-12-15 ENCOUNTER — Other Ambulatory Visit: Payer: Self-pay | Admitting: Internal Medicine

## 2021-12-25 ENCOUNTER — Other Ambulatory Visit: Payer: Self-pay | Admitting: Internal Medicine

## 2021-12-25 DIAGNOSIS — G1220 Motor neuron disease, unspecified: Secondary | ICD-10-CM | POA: Diagnosis not present

## 2021-12-25 DIAGNOSIS — G1221 Amyotrophic lateral sclerosis: Secondary | ICD-10-CM | POA: Diagnosis not present

## 2021-12-31 DIAGNOSIS — G1221 Amyotrophic lateral sclerosis: Secondary | ICD-10-CM | POA: Diagnosis not present

## 2021-12-31 DIAGNOSIS — E119 Type 2 diabetes mellitus without complications: Secondary | ICD-10-CM | POA: Diagnosis not present

## 2021-12-31 DIAGNOSIS — Z7409 Other reduced mobility: Secondary | ICD-10-CM | POA: Diagnosis not present

## 2021-12-31 DIAGNOSIS — Z9181 History of falling: Secondary | ICD-10-CM | POA: Diagnosis not present

## 2021-12-31 DIAGNOSIS — R471 Dysarthria and anarthria: Secondary | ICD-10-CM | POA: Diagnosis not present

## 2021-12-31 DIAGNOSIS — Z7984 Long term (current) use of oral hypoglycemic drugs: Secondary | ICD-10-CM | POA: Diagnosis not present

## 2022-01-01 ENCOUNTER — Telehealth: Payer: Self-pay | Admitting: *Deleted

## 2022-01-01 NOTE — Telephone Encounter (Signed)
Left message for pt to call to schedule f/u lung screening CT scan ?

## 2022-01-05 ENCOUNTER — Other Ambulatory Visit: Payer: Self-pay | Admitting: *Deleted

## 2022-01-05 DIAGNOSIS — Z87891 Personal history of nicotine dependence: Secondary | ICD-10-CM

## 2022-01-06 DIAGNOSIS — G1221 Amyotrophic lateral sclerosis: Secondary | ICD-10-CM | POA: Diagnosis not present

## 2022-01-14 ENCOUNTER — Other Ambulatory Visit: Payer: Self-pay

## 2022-01-14 ENCOUNTER — Ambulatory Visit (HOSPITAL_BASED_OUTPATIENT_CLINIC_OR_DEPARTMENT_OTHER)
Admission: RE | Admit: 2022-01-14 | Discharge: 2022-01-14 | Disposition: A | Discharge status: Home / Self Care | Payer: Medicare Other | Source: Ambulatory Visit | Attending: Acute Care | Admitting: Acute Care

## 2022-01-14 DIAGNOSIS — Z87891 Personal history of nicotine dependence: Secondary | ICD-10-CM | POA: Diagnosis not present

## 2022-01-14 IMAGING — CT CT CHEST LUNG CANCER SCREENING LOW DOSE W/O CM
2 of 4 series · 15 of 40 positions shown, 18 images · non-contrast
Comparison: [DATE] screening chest CT.

CLINICAL DATA: 70-year-old asymptomatic male former smoker with 56
pack-year smoking history.



[Series 2: axial st · axial · 0.83mm/px · z∈[-289,-39]mm · 12 of 60 slices shown, 15 images]
[im 5/60  mediastinal]
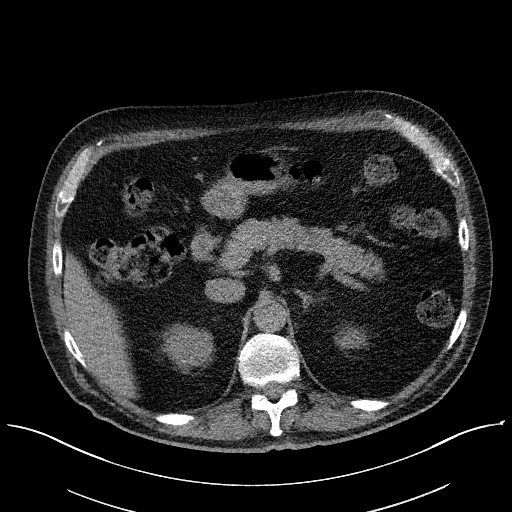
[im 5/60  lung]
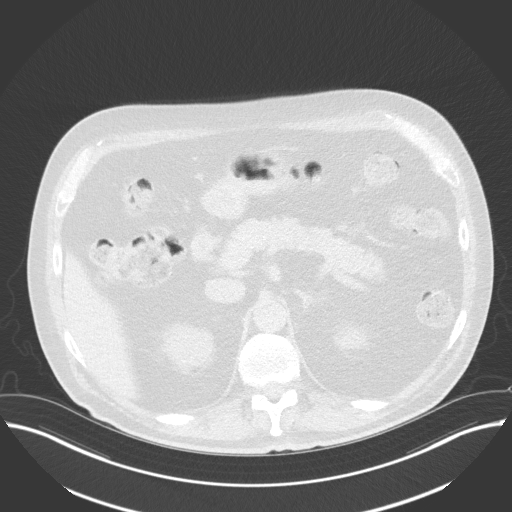
[im 10/60  lung]
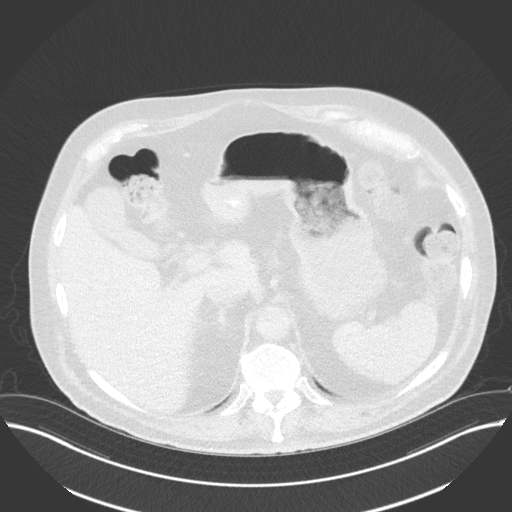
[im 14/60  lung]
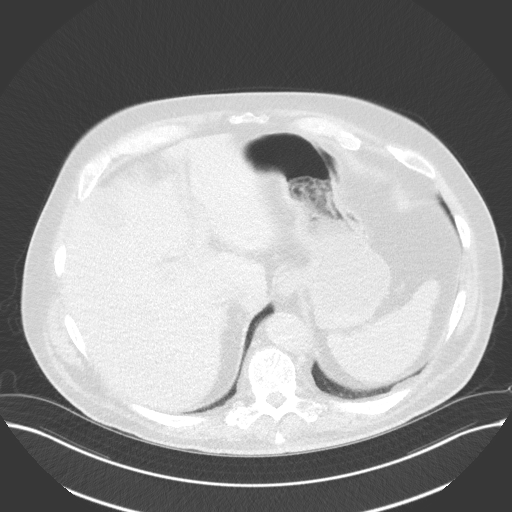
[im 19/60  lung]
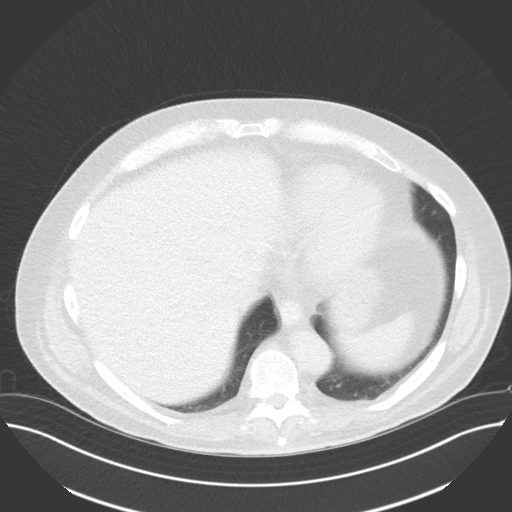
[im 23/60  mediastinal]
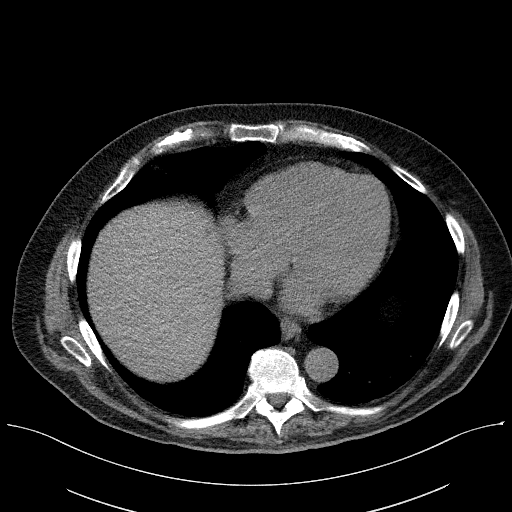
[im 23/60  lung]
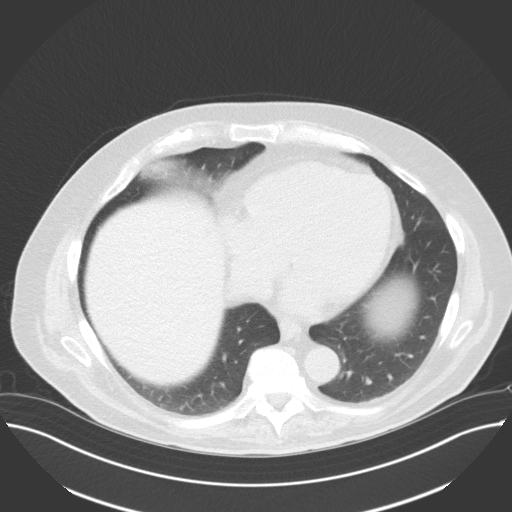
[im 28/60  lung]
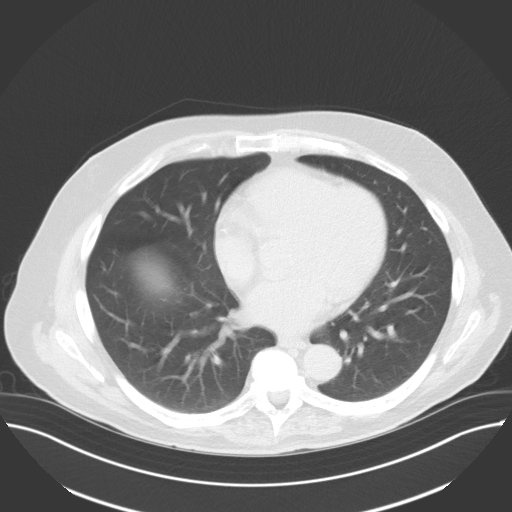
[im 32/60  lung]
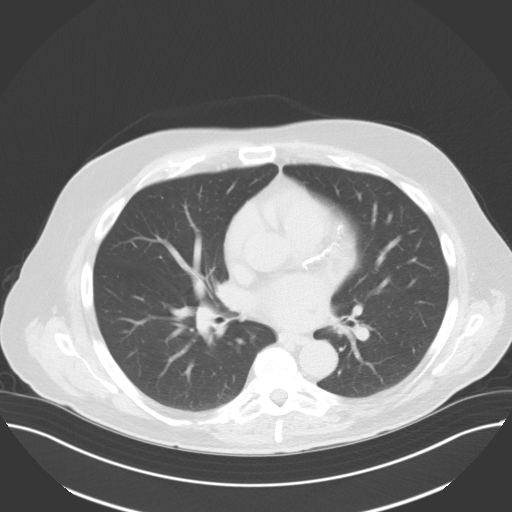
[im 37/60  lung]
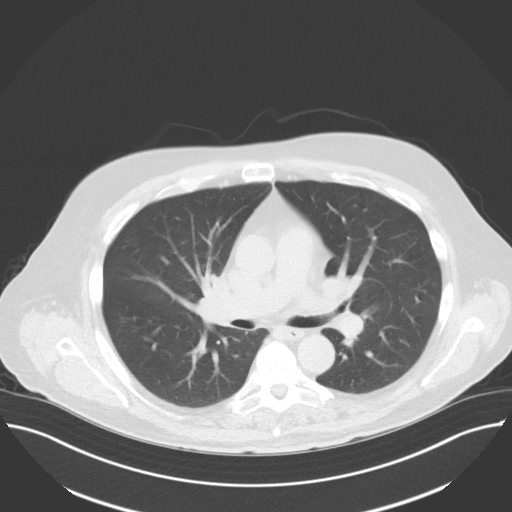
[im 41/60  mediastinal]
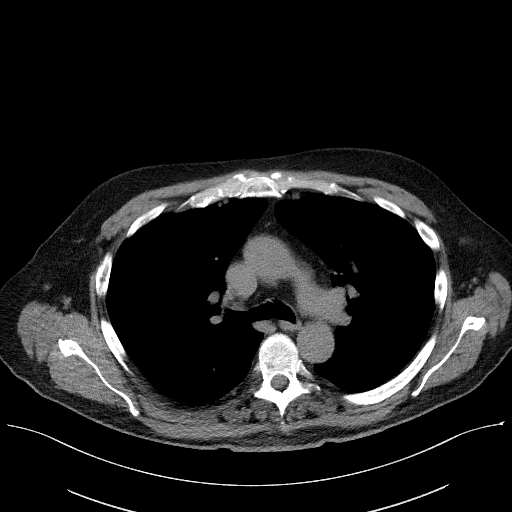
[im 41/60  lung]
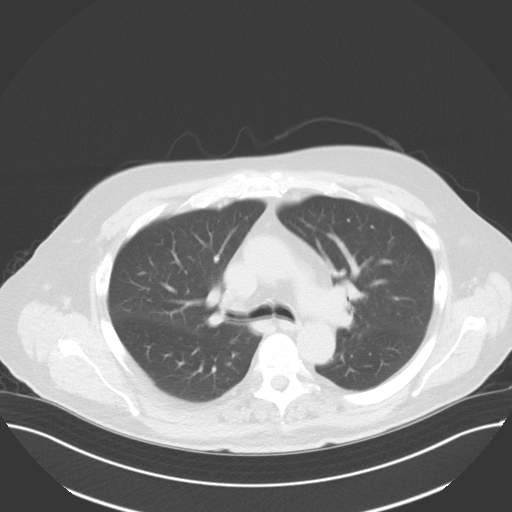
[im 46/60  lung]
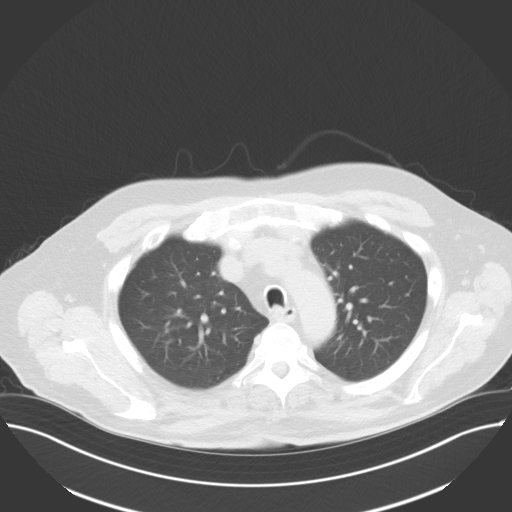
[im 50/60  lung]
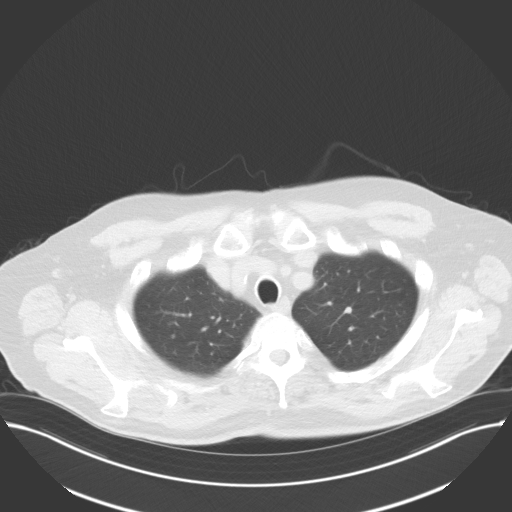
[im 55/60  lung]
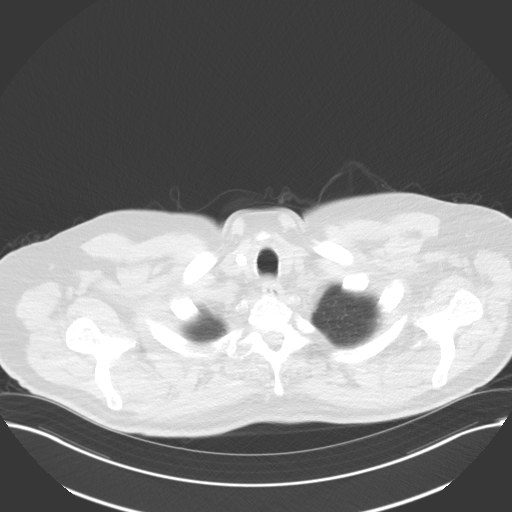

[Series 5: coronal · coronal · 0.61mm/px · 3 of 308 slices shown]
[im 62/308  lung]
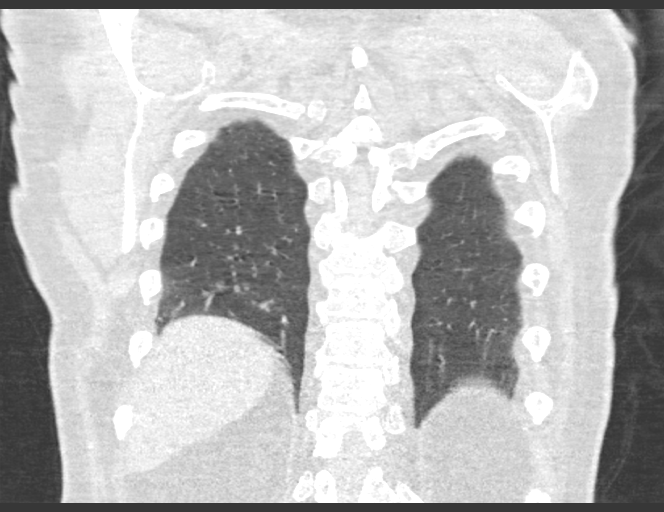
[im 123/308  lung]
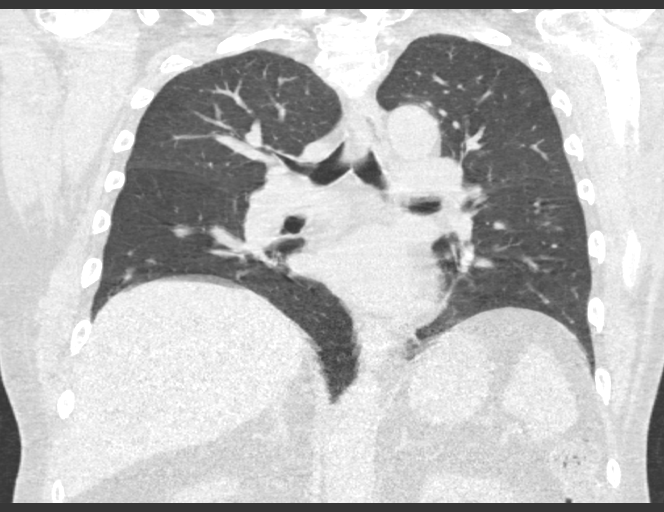
[im 185/308  lung]
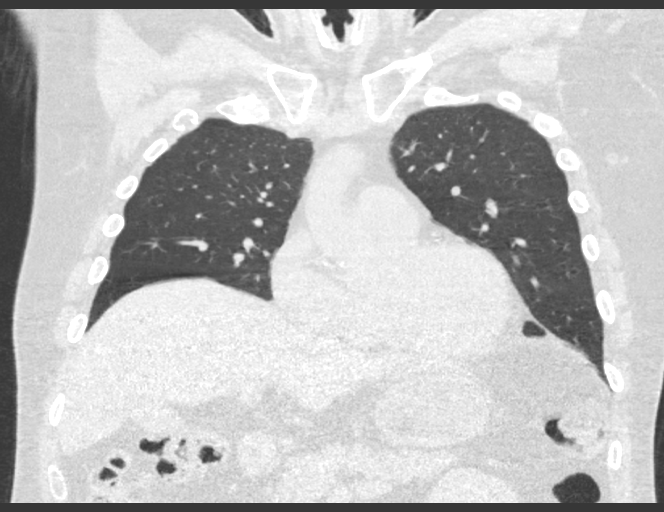

[15 of 40 positions shown; findings below may reference images not displayed]

FINDINGS: Cardiovascular: Normal heart size. No significant pericardial
effusion/thickening. Three-vessel coronary atherosclerosis.
Atherosclerotic nonaneurysmal thoracic aorta. Normal caliber
pulmonary arteries.

Mediastinum/Nodes: Coarsely calcified right thyroid nodule,
unchanged. Not clinically significant; no follow-up imaging
recommended (ref: [HOSPITAL]. [DATE]): 143-50).
Unremarkable esophagus. No pathologically enlarged axillary,
mediastinal or hilar lymph nodes, noting limited sensitivity for the
detection of hilar adenopathy on this noncontrast study.

Lungs/Pleura: No pneumothorax. No pleural effusion. Mild
centrilobular emphysema with diffuse bronchial wall thickening. No
acute consolidative airspace disease or lung masses. No significant
growth of previously visualized pulmonary nodules. No new
significant pulmonary nodules.

Upper abdomen: No acute abnormality.

Musculoskeletal: No aggressive appearing focal osseous lesions. Mild
thoracic spondylosis.
IMPRESSION: 1. Lung-RADS 2, benign appearance or behavior. Continue annual
screening with low-dose chest CT without contrast in 12 months.
2. Three-vessel coronary atherosclerosis.
3. Aortic Atherosclerosis ([TS]-[TS]) and Emphysema ([TS]-[TS]).

## 2022-01-19 ENCOUNTER — Other Ambulatory Visit: Payer: Self-pay

## 2022-01-19 DIAGNOSIS — Z87891 Personal history of nicotine dependence: Secondary | ICD-10-CM

## 2022-02-02 DIAGNOSIS — L812 Freckles: Secondary | ICD-10-CM | POA: Diagnosis not present

## 2022-02-02 DIAGNOSIS — D225 Melanocytic nevi of trunk: Secondary | ICD-10-CM | POA: Diagnosis not present

## 2022-02-02 DIAGNOSIS — D2262 Melanocytic nevi of left upper limb, including shoulder: Secondary | ICD-10-CM | POA: Diagnosis not present

## 2022-02-02 DIAGNOSIS — D2372 Other benign neoplasm of skin of left lower limb, including hip: Secondary | ICD-10-CM | POA: Diagnosis not present

## 2022-02-02 DIAGNOSIS — D1801 Hemangioma of skin and subcutaneous tissue: Secondary | ICD-10-CM | POA: Diagnosis not present

## 2022-02-02 DIAGNOSIS — L821 Other seborrheic keratosis: Secondary | ICD-10-CM | POA: Diagnosis not present

## 2022-02-06 DIAGNOSIS — G1221 Amyotrophic lateral sclerosis: Secondary | ICD-10-CM | POA: Diagnosis not present

## 2022-02-19 ENCOUNTER — Ambulatory Visit: Payer: Medicare Other

## 2022-02-19 ENCOUNTER — Ambulatory Visit (INDEPENDENT_AMBULATORY_CARE_PROVIDER_SITE_OTHER): Payer: Medicare Other

## 2022-02-19 VITALS — BP 136/82 | HR 54 | Temp 97.8°F | Resp 16 | Ht 76.0 in | Wt 240.6 lb

## 2022-02-19 DIAGNOSIS — Z0000 Encounter for general adult medical examination without abnormal findings: Secondary | ICD-10-CM | POA: Diagnosis not present

## 2022-02-19 NOTE — Progress Notes (Signed)
? ?Subjective:  ? [name] [name] is a 71 y.o. male who presents for Medicare Annual/Subsequent preventive examination. ? ?Review of Systems    ? ?  ? ?   ?Objective:  ?  ?There were no vitals filed for this visit. ?There is no height or weight on file to calculate BMI. ? ? ?  11/27/2021  ? 10:08 AM 08/28/2021  ?  2:24 PM 02/13/2021  ?  3:18 PM 02/05/2020  ?  1:50 PM 02/02/2019  ?  3:10 PM 01/31/2018  ?  1:19 PM 01/28/2017  ?  9:53 AM  ?Advanced Directives  ?Does Patient Have a Medical Advance Directive? No No No No No No No  ?Would patient like information on creating a medical advance directive?  No - Patient declined No - Patient declined No - Patient declined No - Patient declined No - Patient declined Yes (MAU/Ambulatory/Procedural Areas - Information given)  ? ? ?Current Medications (verified) ?Outpatient Encounter Medications as of 02/19/2022  ?Medication Sig  ? lisinopril (ZESTRIL) 2.5 MG tablet TAKE 1 TABLET BY MOUTH  DAILY  ? metFORMIN (GLUCOPHAGE-XR) 500 MG 24 hr tablet Take 2 tablets (1,000 mg total) by mouth 2 (two) times daily.  ? Accu-Chek Softclix Lancets lancets Use as instructed  ? aspirin 81 MG tablet Take 81 mg by mouth daily.  ? atorvastatin (LIPITOR) 40 MG tablet TAKE 1 TABLET BY MOUTH  DAILY  ? azelastine (ASTELIN) 0.1 % nasal spray Place 2 sprays into both nostrils at bedtime.  ? Blood Glucose Monitoring Suppl (ACCU-CHEK AVIVA PLUS) w/Device KIT Use to check blood sugar  ? budesonide-formoterol (SYMBICORT) 160-4.5 MCG/ACT inhaler Inhale 1 puff into the lungs 2 (two) times daily.  ? empagliflozin (JARDIANCE) 10 MG TABS tablet Take 1 tablet (10 mg total) by mouth daily before breakfast.  ? fish oil-omega-3 fatty acids 1000 MG capsule Take 2 g by mouth daily.  ? fluticasone (FLONASE) 50 MCG/ACT nasal spray Place into both nostrils daily.  ? glucose blood (ACCU-CHEK AVIVA) test strip Check blood sugar no more than twice daily  ? levothyroxine (SYNTHROID) 88 MCG tablet TAKE 1 TABLET BY MOUTH  DAILY BEFORE  BREAKFAST  ? Melatonin 1 MG TABS Take 1 mg by mouth daily.  ? metoprolol tartrate (LOPRESSOR) 25 MG tablet TAKE 1 TABLET(25 MG) BY MOUTH TWICE DAILY  ? Multiple Vitamin (MULTIVITAMIN) tablet Take 1 tablet by mouth daily.  ? mupirocin cream (BACTROBAN) 2 % Apply 1 application topically as needed. Reported on 01/27/2016  ? sildenafil (REVATIO) 20 MG tablet TAKE 2 TO 3 TABLETS BY MOUTH ONCE DAILY AS NEEDED  ? triamcinolone cream (KENALOG) 0.1 %   ? ?No facility-administered encounter medications on file as of 02/19/2022.  ? ? ?Allergies (verified) ?Patient has no known allergies.  ? ?History: ?Past Medical History:  ?Diagnosis Date  ? Bradycardia 05/04/2012  ? Colon polyps   ? Colon polyps at age 39, next Cscope age 78  ? Diabetes mellitus   ? DJD (degenerative joint disease)   ? Glaucoma suspect   ? Hyperlipidemia   ? ?Past Surgical History:  ?Procedure Laterality Date  ? BUNIONECTOMY Right 12/27/2014  ? [facility]  ? BUNIONECTOMY Left   ? 2019  ? ESOPHAGEAL MANOMETRY N/A 07/23/2021  ? Procedure: ESOPHAGEAL MANOMETRY (EM);  Surgeon: Wilford Corner, MD;  Location: [facility];  Service: Endoscopy;  Laterality: N/A;  ? NASAL SEPTUM SURGERY  2000  ? SHOULDER ARTHROSCOPY    ? L 2011, R 2012  ? ?Family  History  ?Problem Relation Age of Onset  ? Arthritis Mother   ? Alzheimer's disease Mother   ? Lung cancer Father   ? Stroke Other   ? Diabetes Other   ?     uncle, brother (borderline)  ? GER disease Sister   ? Colon cancer Neg Hx   ? Prostate cancer Neg Hx   ? CAD Neg Hx   ? ?Social History  ? ?Socioeconomic History  ? Marital status: Married  ?  Spouse name: Not on file  ? Number of children: 2  ? Years of education: Not on file  ? Highest education level: Not on file  ?Occupational History  ? Occupation: disability  ?  Employer: [employer]  ?Tobacco Use  ? Smoking status: Former  ? Smokeless tobacco: Never  ? Tobacco comments:  ?  quit on-off, no smoking since 12/2018  ?Substance and Sexual Activity  ? Alcohol  use: Yes  ?  Alcohol/week: 2.0 standard drinks  ?  Types: 2 Cans of beer per week  ?  Comment: socially   ? Drug use: No  ? Sexual activity: Yes  ?Other Topics Concern  ? Not on file  ?Social History Narrative  ? Household-- pt and wife   ? Luz Lex a lot   ? ?Social Determinants of Health  ? ?Financial Resource Strain: Low Risk   ? Difficulty of Paying Living Expenses: Not hard at all  ?Food Insecurity: No Food Insecurity  ? Worried About Charity fundraiser in the Last Year: Never true  ? Ran Out of Food in the Last Year: Never true  ?Transportation Needs: No Transportation Needs  ? Lack of Transportation (Medical): No  ? Lack of Transportation (Non-Medical): No  ?Physical Activity: Insufficiently Active  ? Days of Exercise per Week: 3 days  ? Minutes of Exercise per Session: 30 min  ?Stress: No Stress Concern Present  ? Feeling of Stress : Not at all  ?Social Connections: Moderately Isolated  ? Frequency of Communication with Friends and Family: More than three times a week  ? Frequency of Social Gatherings with Friends and Family: More than three times a week  ? Attends Religious Services: Never  ? Active Member of Clubs or Organizations: No  ? Attends Archivist Meetings: Never  ? Marital Status: Married  ? ? ?Tobacco Counseling ?Counseling given: Not Answered ?Tobacco comments: quit on-off, no smoking since 12/2018 ? ? ?Clinical Intake: ? ?  ? ?  ? ?  ? ?  ? ?  ? ?Diabetic?Yes ?Nutrition Risk Assessment: ? ?Has the patient had any N/V/D within the last 2 months?  No  ?Does the patient have any non-healing wounds?  No  ?Has the patient had any unintentional weight loss or weight gain?  No  ? ?Diabetes: ? ?Is the patient diabetic?  Yes  ?If diabetic, was a CBG obtained today?  No  ?Did the patient bring in their glucometer from home?  No  ?How often do you monitor your CBG's? 3-4 times a week.  ? ?Financial Strains and Diabetes Management: ? ?Are you having any financial strains with the device, your  supplies or your medication? No .  ?Does the patient want to be seen by Chronic Care Management for management of their diabetes?  No  ?Would the patient like to be referred to a Nutritionist or for Diabetic Management?  No  ? ?Diabetic Exams: ? ?Diabetic Eye Exam: Completed 12/01/21 ?Diabetic Foot Exam: Overdue, Pt has been  advised about the importance in completing this exam. Pt is scheduled for diabetic foot exam on 3/23.  ? ?  ? ?  ? ? ?Activities of Daily Living ?   ? View : No data to display.  ?  ?  ?  ? ? ?Patient Care Team: ?Colon Branch, MD as PCP - General (Internal Medicine) ?Buford Dresser, MD as PCP - Cardiology (Cardiology) ?Juluis Rainier (Optometry) ?Jarome Matin, MD as Consulting Physician (Dermatology) ?Wilford Corner, MD as Consulting Physician (Gastroenterology) ? ?Indicate any recent Medical Services you may have received from other than [facility] providers in the past year (date may be approximate). ? ?   ?Assessment:  ? This is a routine wellness examination for [facility]. ? ?Hearing/Vision screen ?No results found. ? ?Dietary issues and exercise activities discussed: ?  ? ? Goals Addressed   ?None ?  ? ?Depression Screen ? ?  12/01/2021  ? 10:41 AM 05/19/2021  ?  9:25 AM 02/13/2021  ?  3:19 PM 08/21/2020  ? 10:48 AM 02/05/2020  ?  2:00 PM 08/29/2019  ?  8:15 AM 02/02/2019  ?  3:10 PM  ?PHQ 2/9 Scores  ?PHQ - 2 Score 0 0 0 0 0 0 0  ?  ?Fall Risk ? ?  12/01/2021  ? 10:41 AM 05/19/2021  ?  9:25 AM 02/17/2021  ? 10:05 AM 02/13/2021  ?  3:19 PM 08/21/2020  ? 10:48 AM  ?Fall Risk   ?Falls in the past year? 0 0 0  0  ?Number falls in past yr: 0 0 0 0 0  ?Injury with Fall? 0 0 0 0 0  ?Follow up Falls evaluation completed   Falls prevention discussed Falls evaluation completed  ? ? ?FALL RISK PREVENTION PERTAINING TO THE HOME: ? ?Any stairs in or around the home? Yes  ?If so, are there any without handrails? No  ?Home free of loose throw rugs in walkways, pet beds, electrical cords, etc? Yes  ?Adequate lighting in  your home to reduce risk of falls? Yes  ? ?ASSISTIVE DEVICES UTILIZED TO PREVENT FALLS: ? ?Life alert? No  ?Use of a cane, walker or w/c? No  ?Grab bars in the bathroom? No  ?Shower chair or bench in shower? No

## 2022-02-19 NOTE — Patient Instructions (Signed)
Mr. Perotti , ?Thank you for taking time to come for your Medicare Wellness Visit. I appreciate your ongoing commitment to your health goals. Please review the following plan we discussed and let me know if I can assist you in the future.  ? ?Screening recommendations/referrals: ?Colonoscopy: 02/01/20 due 01/31/25 ?Recommended yearly ophthalmology/optometry visit for glaucoma screening and checkup ?Recommended yearly dental visit for hygiene and checkup ? ?Vaccinations: ?Influenza vaccine: up to date ?Pneumococcal vaccine: up to date ?Tdap vaccine: up to date ?Shingles vaccine: up to date   ?Covid-19: up to date ? ?Advanced directives: no ? ?Conditions/risks identified: see problem list ? ?Next appointment: Follow up in one year for your annual wellness visit. 02/23/23 ? ?Preventive Care 60 Years and Older, Male ?Preventive care refers to lifestyle choices and visits with your health care provider that can promote health and wellness. ?What does preventive care include? ?A yearly physical exam. This is also called an annual well check. ?Dental exams once or twice a year. ?Routine eye exams. Ask your health care provider how often you should have your eyes checked. ?Personal lifestyle choices, including: ?Daily care of your teeth and gums. ?Regular physical activity. ?Eating a healthy diet. ?Avoiding tobacco and drug use. ?Limiting alcohol use. ?Practicing safe sex. ?Taking low doses of aspirin every day. ?Taking vitamin and mineral supplements as recommended by your health care provider. ?What happens during an annual well check? ?The services and screenings done by your health care provider during your annual well check will depend on your age, overall health, lifestyle risk factors, and family history of disease. ?Counseling  ?Your health care provider may ask you questions about your: ?Alcohol use. ?Tobacco use. ?Drug use. ?Emotional well-being. ?Home and relationship well-being. ?Sexual activity. ?Eating  habits. ?History of falls. ?Memory and ability to understand (cognition). ?Work and work Statistician. ?Screening  ?You may have the following tests or measurements: ?Height, weight, and BMI. ?Blood pressure. ?Lipid and cholesterol levels. These may be checked every 5 years, or more frequently if you are over 92 years old. ?Skin check. ?Lung cancer screening. You may have this screening every year starting at age 17 if you have a 30-pack-year history of smoking and currently smoke or have quit within the past 15 years. ?Fecal occult blood test (FOBT) of the stool. You may have this test every year starting at age 32. ?Flexible sigmoidoscopy or colonoscopy. You may have a sigmoidoscopy every 5 years or a colonoscopy every 10 years starting at age 74. ?Prostate cancer screening. Recommendations will vary depending on your family history and other risks. ?Hepatitis C blood test. ?Hepatitis B blood test. ?Sexually transmitted disease (STD) testing. ?Diabetes screening. This is done by checking your blood sugar (glucose) after you have not eaten for a while (fasting). You may have this done every 1-3 years. ?Abdominal aortic aneurysm (AAA) screening. You may need this if you are a current or former smoker. ?Osteoporosis. You may be screened starting at age 23 if you are at high risk. ?Talk with your health care provider about your test results, treatment options, and if necessary, the need for more tests. ?Vaccines  ?Your health care provider may recommend certain vaccines, such as: ?Influenza vaccine. This is recommended every year. ?Tetanus, diphtheria, and acellular pertussis (Tdap, Td) vaccine. You may need a Td booster every 10 years. ?Zoster vaccine. You may need this after age 17. ?Pneumococcal 13-valent conjugate (PCV13) vaccine. One dose is recommended after age 72. ?Pneumococcal polysaccharide (PPSV23) vaccine. One dose is recommended after  age 51. ?Talk to your health care provider about which screenings and  vaccines you need and how often you need them. ?This information is not intended to replace advice given to you by your health care provider. Make sure you discuss any questions you have with your health care provider. ?Document Released: 11/08/2015 Document Revised: 07/01/2016 Document Reviewed: 08/13/2015 ?Elsevier Interactive Patient Education ? 2017 [employer]. ? ?Fall Prevention in the Home ?Falls can cause injuries. They can happen to people of all ages. There are many things you can do to make your home safe and to help prevent falls. ?What can I do on the outside of my home? ?Regularly fix the edges of walkways and driveways and fix any cracks. ?Remove anything that might make you trip as you walk through a door, such as a raised step or threshold. ?Trim any bushes or trees on the path to your home. ?Use bright outdoor lighting. ?Clear any walking paths of anything that might make someone trip, such as rocks or tools. ?Regularly check to see if handrails are loose or broken. Make sure that both sides of any steps have handrails. ?Any raised decks and porches should have guardrails on the edges. ?Have any leaves, snow, or ice cleared regularly. ?Use sand or salt on walking paths during winter. ?Clean up any spills in your garage right away. This includes oil or grease spills. ?What can I do in the bathroom? ?Use night lights. ?Install grab bars by the toilet and in the tub and shower. Do not use towel bars as grab bars. ?Use non-skid mats or decals in the tub or shower. ?If you need to sit down in the shower, use a plastic, non-slip stool. ?Keep the floor dry. Clean up any water that spills on the floor as soon as it happens. ?Remove soap buildup in the tub or shower regularly. ?Attach bath mats securely with double-sided non-slip rug tape. ?Do not have throw rugs and other things on the floor that can make you trip. ?What can I do in the bedroom? ?Use night lights. ?Make sure that you have a light by your  bed that is easy to reach. ?Do not use any sheets or blankets that are too big for your bed. They should not hang down onto the floor. ?Have a firm chair that has side arms. You can use this for support while you get dressed. ?Do not have throw rugs and other things on the floor that can make you trip. ?What can I do in the kitchen? ?Clean up any spills right away. ?Avoid walking on wet floors. ?Keep items that you use a lot in easy-to-reach places. ?If you need to reach something above you, use a strong step stool that has a grab bar. ?Keep electrical cords out of the way. ?Do not use floor polish or wax that makes floors slippery. If you must use wax, use non-skid floor wax. ?Do not have throw rugs and other things on the floor that can make you trip. ?What can I do with my stairs? ?Do not leave any items on the stairs. ?Make sure that there are handrails on both sides of the stairs and use them. Fix handrails that are broken or loose. Make sure that handrails are as long as the stairways. ?Check any carpeting to make sure that it is firmly attached to the stairs. Fix any carpet that is loose or worn. ?Avoid having throw rugs at the top or bottom of the stairs. If you do  have throw rugs, attach them to the floor with carpet tape. ?Make sure that you have a light switch at the top of the stairs and the bottom of the stairs. If you do not have them, ask someone to add them for you. ?What else can I do to help prevent falls? ?Wear shoes that: ?Do not have high heels. ?Have rubber bottoms. ?Are comfortable and fit you well. ?Are closed at the toe. Do not wear sandals. ?If you use a stepladder: ?Make sure that it is fully opened. Do not climb a closed stepladder. ?Make sure that both sides of the stepladder are locked into place. ?Ask someone to hold it for you, if possible. ?Clearly mark and make sure that you can see: ?Any grab bars or handrails. ?First and last steps. ?Where the edge of each step is. ?Use tools that  help you move around (mobility aids) if they are needed. These include: ?Canes. ?Walkers. ?Scooters. ?Crutches. ?Turn on the lights when you go into a dark area. Replace any light bulbs as soon as they burn out. ?Se

## 2022-03-08 DIAGNOSIS — G1221 Amyotrophic lateral sclerosis: Secondary | ICD-10-CM | POA: Diagnosis not present

## 2022-03-30 ENCOUNTER — Ambulatory Visit (INDEPENDENT_AMBULATORY_CARE_PROVIDER_SITE_OTHER): Payer: Medicare Other | Admitting: Internal Medicine

## 2022-03-30 ENCOUNTER — Encounter: Payer: Self-pay | Admitting: Internal Medicine

## 2022-03-30 VITALS — BP 118/68 | HR 53 | Temp 98.0°F | Resp 18 | Ht 76.0 in | Wt 234.1 lb

## 2022-03-30 DIAGNOSIS — R809 Proteinuria, unspecified: Secondary | ICD-10-CM | POA: Diagnosis not present

## 2022-03-30 DIAGNOSIS — E039 Hypothyroidism, unspecified: Secondary | ICD-10-CM

## 2022-03-30 DIAGNOSIS — E1129 Type 2 diabetes mellitus with other diabetic kidney complication: Secondary | ICD-10-CM

## 2022-03-30 DIAGNOSIS — G1221 Amyotrophic lateral sclerosis: Secondary | ICD-10-CM

## 2022-03-30 DIAGNOSIS — F439 Reaction to severe stress, unspecified: Secondary | ICD-10-CM | POA: Diagnosis not present

## 2022-03-30 LAB — COMPREHENSIVE METABOLIC PANEL
ALT: 19 U/L (ref 0–53)
AST: 18 U/L (ref 0–37)
Albumin: 4.3 g/dL (ref 3.5–5.2)
Alkaline Phosphatase: 66 U/L (ref 39–117)
BUN: 24 mg/dL — ABNORMAL HIGH (ref 6–23)
CO2: 29 mEq/L (ref 19–32)
Calcium: 9.4 mg/dL (ref 8.4–10.5)
Chloride: 100 mEq/L (ref 96–112)
Creatinine, Ser: 1.35 mg/dL (ref 0.40–1.50)
GFR: 53.5 mL/min — ABNORMAL LOW (ref >60.00)
Glucose, Bld: 89 mg/dL (ref 70–99)
Potassium: 4.3 mEq/L (ref 3.5–5.1)
Sodium: 138 mEq/L (ref 135–145)
Total Bilirubin: 1.5 mg/dL — ABNORMAL HIGH (ref 0.2–1.2)
Total Protein: 6.6 g/dL (ref 6.0–8.3)

## 2022-03-30 LAB — CBC WITH DIFFERENTIAL/PLATELET
Basophils Absolute: 0 10*3/uL (ref 0.0–0.1)
Basophils Relative: 0.3 % (ref 0.0–3.0)
Eosinophils Absolute: 0.3 10*3/uL (ref 0.0–0.7)
Eosinophils Relative: 5.3 % — ABNORMAL HIGH (ref 0.0–5.0)
HCT: 38.9 % — ABNORMAL LOW (ref 39.0–52.0)
Hemoglobin: 13 g/dL (ref 13.0–17.0)
Lymphocytes Relative: 29.2 % (ref 12.0–46.0)
Lymphs Abs: 1.6 10*3/uL (ref 0.7–4.0)
MCHC: 33.5 g/dL (ref 30.0–36.0)
MCV: 95 fl (ref 78.0–100.0)
Monocytes Absolute: 0.5 10*3/uL (ref 0.1–1.0)
Monocytes Relative: 9.5 % (ref 3.0–12.0)
Neutro Abs: 3.1 10*3/uL (ref 1.4–7.7)
Neutrophils Relative %: 55.7 % (ref 43.0–77.0)
Platelets: 190 10*3/uL (ref 150.0–400.0)
RBC: 4.1 Mil/uL — ABNORMAL LOW (ref 4.22–5.81)
RDW: 13.7 % (ref 11.5–15.5)
WBC: 5.6 10*3/uL (ref 4.0–10.5)

## 2022-03-30 LAB — TSH: TSH: 2.56 u[IU]/mL (ref 0.35–5.50)

## 2022-03-30 LAB — HEMOGLOBIN A1C: Hgb A1c MFr Bld: 5.7 % (ref 4.6–6.5)

## 2022-03-30 MED ORDER — TETANUS-DIPHTH-ACELL PERTUSSIS 5-2.5-18.5 LF-MCG/0.5 IM SUSP: 0.5000 mL | Freq: Once | INTRAMUSCULAR | 0 refills | Status: AC

## 2022-03-30 NOTE — Assessment & Plan Note (Signed)
ALS:  Saw neurology 12/31/2021, significant sxs >> dysphagia, dysarthria.  Also muscle weakness.   Some symptoms of respiratory compromise, currently using BiPAP at night. Started riluzole, needs a CMPs regulalrly. 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 Has R foot drop Next visit w/ neuro June 2023 Check CMP, CBC COPD asthma: See above, Symbicort was discontinued, symptoms at baseline. Meningioma: Dx 10/22/2021 via MRI brain.  Saw neurosurgery, unable to find the note. DM: Continue Jardiance, metformin.  At some point  may have to change to liquid metformin due to pill size. Hypothyroidism: On Synthroid, check TSH Stress: Obviously affected by ALS, emotional support provided, knows to reach out if he needs something for depression Preventive care: Tdap recommended. RTC 3 months

## 2022-03-30 NOTE — Progress Notes (Signed)
Subjective:    Patient ID: [name] [name], male    DOB: Feb 12, 1951, 71 y.o.   MRN: [id number]  DOS:  03/30/2022 Type of visit - description: Follow-up  Since the last office visit, ALS symptoms increased. Notes from neurology reviewed.  He denies chest pain. No nausea or diarrhea. Occasional cough.   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   Colon polyps    Colon polyps at age 58, next Cscope age 55   Diabetes mellitus    DJD (degenerative joint disease)    Glaucoma suspect    Hyperlipidemia     Past Surgical History:  Procedure Laterality Date   [facility]   BUNIONECTOMY Left    2019   ESOPHAGEAL MANOMETRY N/A 07/23/2021   Procedure: ESOPHAGEAL MANOMETRY (EM);  Surgeon: Wilford Corner, MD;  Location: [facility];  Service: Endoscopy;  Laterality: N/A;   NASAL SEPTUM SURGERY  2000   SHOULDER ARTHROSCOPY     L 2011, R 2012    Current Outpatient Medications  Medication Instructions   Accu-Chek Softclix Lancets lancets Use as instructed   aspirin 81 mg, Daily   atorvastatin (LIPITOR) 40 MG tablet TAKE 1 TABLET BY MOUTH  DAILY   azelastine (ASTELIN) 0.1 % nasal spray 2 sprays, Each Nare, Daily at bedtime   Blood Glucose Monitoring Suppl (ACCU-CHEK AVIVA PLUS) w/Device KIT Use to check blood sugar   budesonide-formoterol (SYMBICORT) 160-4.5 MCG/ACT inhaler 1 puff, Inhalation, 2 times daily   empagliflozin (JARDIANCE) 10 mg, Oral, Daily before breakfast   fish oil-omega-3 fatty acids 2 g, Daily   fluticasone (FLONASE) 50 MCG/ACT nasal spray Each Nare, Daily   glucose blood (ACCU-CHEK AVIVA) test strip Check blood sugar no more than twice daily   levothyroxine (SYNTHROID) 88 MCG tablet TAKE 1 TABLET BY MOUTH  DAILY BEFORE BREAKFAST   lisinopril (ZESTRIL) 2.5 MG tablet TAKE 1 TABLET BY MOUTH  DAILY   melatonin 1 mg, Oral, Daily   metFORMIN (GLUCOPHAGE-XR) 1,000 mg, Oral,  2 times daily   metoprolol tartrate (LOPRESSOR) 25 MG tablet TAKE 1 TABLET(25 MG) BY MOUTH TWICE DAILY   Multiple Vitamin (MULTIVITAMIN) tablet 1 tablet, Daily   mupirocin cream (BACTROBAN) 2 % 1 application., Topical, As needed, Reported on 01/27/2016   riluzole (RILUTEK) 50 MG tablet No dose, route, or frequency recorded.   sildenafil (REVATIO) 20 MG tablet TAKE 2 TO 3 TABLETS BY MOUTH ONCE DAILY AS NEEDED   Tdap (BOOSTRIX) 5-2.5-18.5 LF-MCG/0.5 injection 0.5 mLs, Intramuscular,  Once   triamcinolone cream (KENALOG) 0.1 % No dose, route, or frequency recorded.       Objective:   Physical Exam BP 118/68   Pulse (!) 53   Temp 98 F (36.7 C) (Oral)   Resp 18   Ht 6' 4" (1.93 m)   Wt 234 lb 2 oz (106.2 kg)   SpO2 96%   BMI 28.50 kg/m  General:   Well developed, NAD, BMI noted. HEENT:  Normocephalic . Face symmetric, atraumatic Lungs:  CTA B Normal respiratory effort, no intercostal retractions, no accessory muscle use. Heart: RRR,  no murmur.  Lower extremities: no pretibial edema bilaterally  Skin: Not pale. Not jaundice Neurologic:  alert & oriented X3.  Gait unassisted, R foot drop noted. Significant dysarthria.  We communicate via writing. Psych--  Cognition and judgment appear intact.  Cooperative with normal attention span and concentration.  Behavior appropriate. No  anxious or depressed appearing.      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  Also muscle weakness.   Some symptoms of respiratory compromise, currently using BiPAP at night. Started riluzole, needs a CMPs regulalrly. Symbicort discontinued as dyspnea was due to  ALS. Has R foot drop Next visit w/ neuro June 2023 Check CMP, CBC COPD asthma: See above, Symbicort was discontinued, symptoms at baseline. Meningioma: Dx 10/22/2021 via MRI brain.  Saw neurosurgery, unable to find the note. DM: Continue Jardiance, metformin.  At some point  may have to change to liquid metformin due to pill size. Hypothyroidism: On Synthroid, check TSH Stress: Obviously affected by ALS, emotional support provided, knows to reach out if he needs something for depression Preventive care: Tdap recommended. RTC 3 months

## 2022-03-30 NOTE — Patient Instructions (Addendum)
Recommend to proceed with tdap (tetanus) vaccine at your pharmacy- see printed prescription.   Please share your blood work with the ALS doctor at [facility].  If they require blood drawn call this office :  we can do that here.  If you have difficulties swallowing pills please let me know.     GO TO THE LAB : Get the blood work     [place], [place] back for a checkup in 3 months

## 2022-04-08 DIAGNOSIS — R29818 Other symptoms and signs involving the nervous system: Secondary | ICD-10-CM | POA: Diagnosis not present

## 2022-04-08 DIAGNOSIS — R634 Abnormal weight loss: Secondary | ICD-10-CM | POA: Diagnosis not present

## 2022-04-08 DIAGNOSIS — G1221 Amyotrophic lateral sclerosis: Secondary | ICD-10-CM | POA: Diagnosis not present

## 2022-04-08 DIAGNOSIS — Z79899 Other long term (current) drug therapy: Secondary | ICD-10-CM | POA: Diagnosis not present

## 2022-04-08 DIAGNOSIS — R29898 Other symptoms and signs involving the musculoskeletal system: Secondary | ICD-10-CM | POA: Diagnosis not present

## 2022-04-08 DIAGNOSIS — R471 Dysarthria and anarthria: Secondary | ICD-10-CM | POA: Diagnosis not present

## 2022-04-09 DIAGNOSIS — G1221 Amyotrophic lateral sclerosis: Secondary | ICD-10-CM | POA: Diagnosis not present

## 2022-04-09 DIAGNOSIS — M21372 Foot drop, left foot: Secondary | ICD-10-CM | POA: Diagnosis not present

## 2022-04-10 ENCOUNTER — Encounter: Payer: Self-pay | Admitting: Internal Medicine

## 2022-04-11 ENCOUNTER — Other Ambulatory Visit: Payer: Self-pay | Admitting: Internal Medicine

## 2022-04-13 ENCOUNTER — Other Ambulatory Visit: Payer: Self-pay | Admitting: Internal Medicine

## 2022-04-29 ENCOUNTER — Encounter: Payer: Self-pay | Admitting: Internal Medicine

## 2022-04-30 ENCOUNTER — Other Ambulatory Visit: Payer: Self-pay | Admitting: Internal Medicine

## 2022-05-01 ENCOUNTER — Encounter: Payer: Self-pay | Admitting: Internal Medicine

## 2022-05-01 DIAGNOSIS — G1221 Amyotrophic lateral sclerosis: Secondary | ICD-10-CM

## 2022-05-05 DIAGNOSIS — Z01810 Encounter for preprocedural cardiovascular examination: Secondary | ICD-10-CM | POA: Diagnosis not present

## 2022-05-05 DIAGNOSIS — I10 Essential (primary) hypertension: Secondary | ICD-10-CM | POA: Diagnosis not present

## 2022-05-05 DIAGNOSIS — G1221 Amyotrophic lateral sclerosis: Secondary | ICD-10-CM | POA: Diagnosis not present

## 2022-05-08 DIAGNOSIS — G1221 Amyotrophic lateral sclerosis: Secondary | ICD-10-CM | POA: Diagnosis not present

## 2022-05-12 DIAGNOSIS — Z7982 Long term (current) use of aspirin: Secondary | ICD-10-CM | POA: Diagnosis not present

## 2022-05-12 DIAGNOSIS — Z931 Gastrostomy status: Secondary | ICD-10-CM | POA: Diagnosis not present

## 2022-05-12 DIAGNOSIS — R1310 Dysphagia, unspecified: Secondary | ICD-10-CM | POA: Diagnosis not present

## 2022-05-12 DIAGNOSIS — G1221 Amyotrophic lateral sclerosis: Secondary | ICD-10-CM | POA: Diagnosis not present

## 2022-05-12 DIAGNOSIS — E785 Hyperlipidemia, unspecified: Secondary | ICD-10-CM | POA: Diagnosis not present

## 2022-05-12 DIAGNOSIS — Z7901 Long term (current) use of anticoagulants: Secondary | ICD-10-CM | POA: Diagnosis not present

## 2022-05-12 DIAGNOSIS — R001 Bradycardia, unspecified: Secondary | ICD-10-CM | POA: Diagnosis not present

## 2022-05-12 DIAGNOSIS — G4733 Obstructive sleep apnea (adult) (pediatric): Secondary | ICD-10-CM | POA: Diagnosis not present

## 2022-05-12 DIAGNOSIS — E119 Type 2 diabetes mellitus without complications: Secondary | ICD-10-CM | POA: Diagnosis not present

## 2022-05-12 DIAGNOSIS — R531 Weakness: Secondary | ICD-10-CM | POA: Diagnosis not present

## 2022-05-12 DIAGNOSIS — I10 Essential (primary) hypertension: Secondary | ICD-10-CM | POA: Diagnosis not present

## 2022-05-12 DIAGNOSIS — R4789 Other speech disturbances: Secondary | ICD-10-CM | POA: Diagnosis not present

## 2022-05-12 DIAGNOSIS — Z87891 Personal history of nicotine dependence: Secondary | ICD-10-CM | POA: Diagnosis not present

## 2022-05-12 DIAGNOSIS — E039 Hypothyroidism, unspecified: Secondary | ICD-10-CM | POA: Diagnosis not present

## 2022-05-12 DIAGNOSIS — R634 Abnormal weight loss: Secondary | ICD-10-CM | POA: Diagnosis not present

## 2022-05-12 DIAGNOSIS — Z79899 Other long term (current) drug therapy: Secondary | ICD-10-CM | POA: Diagnosis not present

## 2022-05-12 DIAGNOSIS — D329 Benign neoplasm of meninges, unspecified: Secondary | ICD-10-CM | POA: Diagnosis not present

## 2022-05-12 DIAGNOSIS — G4700 Insomnia, unspecified: Secondary | ICD-10-CM | POA: Diagnosis not present

## 2022-05-13 DIAGNOSIS — Z79899 Other long term (current) drug therapy: Secondary | ICD-10-CM | POA: Diagnosis not present

## 2022-05-13 DIAGNOSIS — Z7901 Long term (current) use of anticoagulants: Secondary | ICD-10-CM | POA: Diagnosis not present

## 2022-05-13 DIAGNOSIS — I10 Essential (primary) hypertension: Secondary | ICD-10-CM | POA: Diagnosis not present

## 2022-05-13 DIAGNOSIS — G4733 Obstructive sleep apnea (adult) (pediatric): Secondary | ICD-10-CM | POA: Diagnosis not present

## 2022-05-13 DIAGNOSIS — G1221 Amyotrophic lateral sclerosis: Secondary | ICD-10-CM | POA: Diagnosis not present

## 2022-05-13 DIAGNOSIS — Z934 Other artificial openings of gastrointestinal tract status: Secondary | ICD-10-CM | POA: Diagnosis not present

## 2022-05-13 DIAGNOSIS — E785 Hyperlipidemia, unspecified: Secondary | ICD-10-CM | POA: Diagnosis not present

## 2022-05-13 DIAGNOSIS — E119 Type 2 diabetes mellitus without complications: Secondary | ICD-10-CM | POA: Diagnosis not present

## 2022-05-13 DIAGNOSIS — D329 Benign neoplasm of meninges, unspecified: Secondary | ICD-10-CM | POA: Diagnosis not present

## 2022-05-13 DIAGNOSIS — R1310 Dysphagia, unspecified: Secondary | ICD-10-CM | POA: Diagnosis not present

## 2022-05-13 DIAGNOSIS — R001 Bradycardia, unspecified: Secondary | ICD-10-CM | POA: Diagnosis not present

## 2022-05-13 DIAGNOSIS — Z87891 Personal history of nicotine dependence: Secondary | ICD-10-CM | POA: Diagnosis not present

## 2022-05-13 DIAGNOSIS — Z931 Gastrostomy status: Secondary | ICD-10-CM | POA: Diagnosis not present

## 2022-05-13 DIAGNOSIS — Z7982 Long term (current) use of aspirin: Secondary | ICD-10-CM | POA: Diagnosis not present

## 2022-05-13 DIAGNOSIS — E039 Hypothyroidism, unspecified: Secondary | ICD-10-CM | POA: Diagnosis not present

## 2022-05-13 DIAGNOSIS — G4700 Insomnia, unspecified: Secondary | ICD-10-CM | POA: Diagnosis not present

## 2022-05-13 DIAGNOSIS — R634 Abnormal weight loss: Secondary | ICD-10-CM | POA: Diagnosis not present

## 2022-05-14 ENCOUNTER — Encounter: Payer: Self-pay | Admitting: Internal Medicine

## 2022-05-14 DIAGNOSIS — Z931 Gastrostomy status: Secondary | ICD-10-CM | POA: Insufficient documentation

## 2022-05-19 ENCOUNTER — Encounter: Payer: Self-pay | Admitting: Internal Medicine

## 2022-05-21 ENCOUNTER — Ambulatory Visit (INDEPENDENT_AMBULATORY_CARE_PROVIDER_SITE_OTHER): Payer: Medicare Other | Admitting: Internal Medicine

## 2022-05-21 ENCOUNTER — Encounter: Payer: Self-pay | Admitting: Internal Medicine

## 2022-05-21 VITALS — BP 118/66 | HR 47 | Temp 98.0°F | Resp 18 | Ht 76.0 in | Wt 230.5 lb

## 2022-05-21 DIAGNOSIS — R809 Proteinuria, unspecified: Secondary | ICD-10-CM

## 2022-05-21 DIAGNOSIS — G1221 Amyotrophic lateral sclerosis: Secondary | ICD-10-CM | POA: Diagnosis not present

## 2022-05-21 DIAGNOSIS — E1129 Type 2 diabetes mellitus with other diabetic kidney complication: Secondary | ICD-10-CM | POA: Diagnosis not present

## 2022-05-21 NOTE — Progress Notes (Signed)
Subjective:    Patient ID: [name] [name], male    DOB: 12/12/1950, 71 y.o.   MRN: [id number]  DOS:  05/21/2022 Type of visit - description: f/u  Since LOV, had a PEG placed. Has not noticed any discharge from the area . In general feels well. Had some feet swelling in the last few days but that is getting better. Ambulatory CBGs typically okay. We talk about his mental/emotional state.   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   Colon polyps    Colon polyps at age 61, next Cscope age 54   Diabetes mellitus    DJD (degenerative joint disease)    Glaucoma suspect    Hyperlipidemia     Past Surgical History:  Procedure Laterality Date   BUNIONECTOMY Right 12/27/2014   [facility]   BUNIONECTOMY Left    2019   ESOPHAGEAL MANOMETRY N/A 07/23/2021   Procedure: ESOPHAGEAL MANOMETRY (EM);  Surgeon: Wilford Corner, MD;  Location: [facility];  Service: Endoscopy;  Laterality: N/A;   NASAL SEPTUM SURGERY  2000   PEG TUBE PLACEMENT     SHOULDER ARTHROSCOPY     L 2011, R 2012    Current Outpatient Medications  Medication Instructions   Accu-Chek Softclix Lancets lancets Use as instructed   aspirin 81 mg, Per Tube, Daily   atorvastatin (LIPITOR) 40 MG tablet TAKE 1 TABLET BY MOUTH DAILY   azelastine (ASTELIN) 0.1 % nasal spray 2 sprays, Each Nare, Daily at bedtime   Blood Glucose Monitoring Suppl (ACCU-CHEK AVIVA PLUS) w/Device KIT Use to check blood sugar   fish oil-omega-3 fatty acids 2 g, Per Tube, Daily   fluticasone (FLONASE) 50 MCG/ACT nasal spray Each Nare, Daily   glucose blood (ACCU-CHEK AVIVA) test strip Check blood sugar no more than twice daily   levothyroxine (SYNTHROID) 88 MCG tablet TAKE 1 TABLET BY MOUTH DAILY  BEFORE BREAKFAST   lisinopril (ZESTRIL) 2.5 MG tablet TAKE 1 TABLET BY MOUTH DAILY   melatonin 1 mg, Per Tube, Daily   metoprolol tartrate (LOPRESSOR) 25 MG tablet TAKE 1 TABLET BY  MOUTH TWICE  DAILY   Multiple Vitamin (MULTIVITAMIN) tablet 1 tablet, Per Tube, Daily   mupirocin cream (BACTROBAN) 2 % 1 application , Topical, As needed, Reported on 01/27/2016   riluzole (RILUTEK) 50 mg, Per Tube, Every 12 hours   sildenafil (REVATIO) 20 MG tablet TAKE 2 TO 3 TABLETS BY MOUTH ONCE DAILY AS NEEDED   triamcinolone cream (KENALOG) 0.1 % No dose, route, or frequency recorded.       Objective:   Physical Exam BP 118/66   Pulse (!) 47   Temp 98 F (36.7 C) (Oral)   Resp 18   Ht '6\' 4"'  (1.93 m)   Wt 230 lb 8 oz (104.6 kg)   SpO2 93%   BMI 28.06 kg/m  General:   Well developed, NAD, BMI noted. HEENT:  Normocephalic . Face symmetric, atraumatic Lungs:  CTA B Normal respiratory effort, no intercostal retractions, no accessory muscle use. Heart: RRR,  no murmur.  Lower extremities: no pretibial edema bilaterally Abdomen: PEG in place, no redness or swelling around it. Skin: Not pale. Not jaundice Neurologic:  alert & oriented X3.  Unable to communicate verbally, he writes his comments and questions. Gait: Assisted by a rolling walker Psych--  Cognition and judgment appear intact.  Cooperative with normal attention span and concentration.  Behavior appropriate. He got very  emotional with talk about ALS     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  PFTs 09/2021 CV: --CAD: Coronary Ca+ noted on c  CT scan chest 10-2020 -- PVCs- on BB ALS Dx ~ 10-2021   PLAN:  Labs reviewed: 03/30/2022: A1c was 5.7. 05/13/2022: Potassium 4.3, blood sugar 200, creatinine 1.1.  Magnesium and phosphorus normal.  Hemoglobin 12.2. ALS: Under the care of neurology, since the last visit the PEG was placed due to difficulty swallowing.  No evidence of complications from the procedure.  Abdomen is soft, no signs of infection  around the abdominal wall. He is getting help somewhat levels regards nutrition. DM: Not on any medication, fasting blood sugars 119, 135.  Nonfasting up to 190.  No change for now, last A1c satisfactory Hypothyroidism: Last TSH okay. Depression?  I am concerned about his mood, he got emotional today but he was very firm saying that he is not depressed.  He tells me he has good family support.  He knows to call if at some point he feels he needs help emotionally.  He verbalized understanding. RTC as scheduled in 5 weeks.

## 2022-05-21 NOTE — Patient Instructions (Signed)
Recommend to proceed with the following vaccines at your pharmacy:  Covid booster (bivalent) Flu shot this fall

## 2022-05-22 NOTE — Assessment & Plan Note (Signed)
Labs reviewed: 03/30/2022: A1c was 5.7. 05/13/2022: Potassium 4.3, blood sugar 200, creatinine 1.1.  Magnesium and phosphorus normal.  Hemoglobin 12.2. ALS: Under the care of neurology, since the last visit the PEG was placed due to difficulty swallowing.  No evidence of complications from the procedure.  Abdomen is soft, no signs of infection around the abdominal wall. He is getting help somewhat levels regards nutrition. DM: Not on any medication, fasting blood sugars 119, 135.  Nonfasting up to 190.  No change for now, last A1c satisfactory Hypothyroidism: Last TSH okay. Depression?  I am concerned about his mood, he got emotional today but he was very firm saying that he is not depressed.  He tells me he has good family support.  He knows to call if at some point he feels he needs help emotionally.  He verbalized understanding. RTC as scheduled in 5 weeks.

## 2022-05-27 ENCOUNTER — Other Ambulatory Visit: Payer: Self-pay | Admitting: Neurosurgery

## 2022-05-27 DIAGNOSIS — D329 Benign neoplasm of meninges, unspecified: Secondary | ICD-10-CM

## 2022-05-28 ENCOUNTER — Encounter: Payer: Self-pay | Admitting: Internal Medicine

## 2022-05-28 DIAGNOSIS — G1221 Amyotrophic lateral sclerosis: Secondary | ICD-10-CM

## 2022-06-01 DIAGNOSIS — H40013 Open angle with borderline findings, low risk, bilateral: Secondary | ICD-10-CM | POA: Diagnosis not present

## 2022-06-04 ENCOUNTER — Other Ambulatory Visit (HOSPITAL_COMMUNITY): Payer: Self-pay | Admitting: Neurosurgery

## 2022-06-04 DIAGNOSIS — D329 Benign neoplasm of meninges, unspecified: Secondary | ICD-10-CM

## 2022-06-08 DIAGNOSIS — G1221 Amyotrophic lateral sclerosis: Secondary | ICD-10-CM | POA: Diagnosis not present

## 2022-06-14 DIAGNOSIS — G1221 Amyotrophic lateral sclerosis: Secondary | ICD-10-CM | POA: Diagnosis not present

## 2022-06-14 DIAGNOSIS — R1310 Dysphagia, unspecified: Secondary | ICD-10-CM | POA: Diagnosis not present

## 2022-07-02 ENCOUNTER — Ambulatory Visit (HOSPITAL_COMMUNITY)
Admission: RE | Admit: 2022-07-02 | Discharge: 2022-07-02 | Disposition: A | Discharge status: Home / Self Care | Payer: Medicare Other | Source: Ambulatory Visit | Attending: Neurosurgery | Admitting: Neurosurgery

## 2022-07-02 DIAGNOSIS — D329 Benign neoplasm of meninges, unspecified: Secondary | ICD-10-CM | POA: Diagnosis not present

## 2022-07-02 DIAGNOSIS — R220 Localized swelling, mass and lump, head: Secondary | ICD-10-CM | POA: Diagnosis not present

## 2022-07-02 DIAGNOSIS — D1802 Hemangioma of intracranial structures: Secondary | ICD-10-CM | POA: Diagnosis not present

## 2022-07-02 MED ORDER — GADOBUTROL 1 MMOL/ML IV SOLN
10.0000 mL | Freq: Once | INTRAVENOUS | Status: AC | PRN
  Administered 2022-07-02: 10 mL via INTRAVENOUS

## 2022-07-07 ENCOUNTER — Ambulatory Visit (INDEPENDENT_AMBULATORY_CARE_PROVIDER_SITE_OTHER): Payer: Medicare Other | Admitting: Internal Medicine

## 2022-07-07 ENCOUNTER — Encounter: Payer: Self-pay | Admitting: Internal Medicine

## 2022-07-07 VITALS — BP 138/60 | HR 63 | Temp 98.0°F | Resp 18 | Ht 76.0 in | Wt 231.5 lb

## 2022-07-07 DIAGNOSIS — R809 Proteinuria, unspecified: Secondary | ICD-10-CM | POA: Diagnosis not present

## 2022-07-07 DIAGNOSIS — G1221 Amyotrophic lateral sclerosis: Secondary | ICD-10-CM | POA: Diagnosis not present

## 2022-07-07 DIAGNOSIS — Z23 Encounter for immunization: Secondary | ICD-10-CM | POA: Diagnosis not present

## 2022-07-07 DIAGNOSIS — E039 Hypothyroidism, unspecified: Secondary | ICD-10-CM

## 2022-07-07 DIAGNOSIS — E1129 Type 2 diabetes mellitus with other diabetic kidney complication: Secondary | ICD-10-CM | POA: Diagnosis not present

## 2022-07-07 LAB — COMPREHENSIVE METABOLIC PANEL
ALT: 39 U/L (ref 0–53)
AST: 24 U/L (ref 0–37)
Albumin: 4.2 g/dL (ref 3.5–5.2)
Alkaline Phosphatase: 87 U/L (ref 39–117)
BUN: 25 mg/dL — ABNORMAL HIGH (ref 6–23)
CO2: 30 mEq/L (ref 19–32)
Calcium: 9.7 mg/dL (ref 8.4–10.5)
Chloride: 90 mEq/L — ABNORMAL LOW (ref 96–112)
Creatinine, Ser: 1.01 mg/dL (ref 0.40–1.50)
GFR: 75.64 mL/min (ref >60.00)
Glucose, Bld: 106 mg/dL — ABNORMAL HIGH (ref 70–99)
Potassium: 4.4 mEq/L (ref 3.5–5.1)
Sodium: 128 mEq/L — ABNORMAL LOW (ref 135–145)
Total Bilirubin: 1.3 mg/dL — ABNORMAL HIGH (ref 0.2–1.2)
Total Protein: 6.7 g/dL (ref 6.0–8.3)

## 2022-07-07 LAB — HEMOGLOBIN A1C: Hgb A1c MFr Bld: 6.5 % (ref 4.6–6.5)

## 2022-07-07 LAB — TSH: TSH: 3.28 u[IU]/mL (ref 0.35–5.50)

## 2022-07-07 NOTE — Patient Instructions (Addendum)
Recommend to proceed with the following vaccines at your pharmacy:  Covid booster (bivalent) RSV vaccine Tdap (tetanus)    Check the  blood pressure regularly BP GOAL is between 110/65 and  135/85. If it is consistently higher or lower, let me know  Diabetes: Check your blood sugar at different times  - early in AM fasting  ( goal 70-130) - 2 hours after a meal (goal less than 180)  GO TO THE LAB : Get the blood work     [place], [place] back for   a checkup in 4 to 5 months

## 2022-07-07 NOTE — Progress Notes (Signed)
Subjective:    Patient ID: [name] [name], male    DOB: 12/13/1950, 71 y.o.   MRN: [id number]  DOS:  07/07/2022 Type of visit - description: Follow-up  Since the last office visit is doing well. Has no major concerns. Some left leg swelling, going on for months.  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   Colon polyps    Colon polyps at age 27, next Cscope age 62   Diabetes mellitus    DJD (degenerative joint disease)    Glaucoma suspect    Hyperlipidemia     Past Surgical History:  Procedure Laterality Date   BUNIONECTOMY Right 12/27/2014   [facility]   BUNIONECTOMY Left    2019   ESOPHAGEAL MANOMETRY N/A 07/23/2021   Procedure: ESOPHAGEAL MANOMETRY (EM);  Surgeon: Wilford Corner, MD;  Location: [facility];  Service: Endoscopy;  Laterality: N/A;   NASAL SEPTUM SURGERY  2000   PEG TUBE PLACEMENT     SHOULDER ARTHROSCOPY     L 2011, R 2012    Current Outpatient Medications  Medication Instructions   Accu-Chek Softclix Lancets lancets Use as instructed   aspirin 81 mg, Per Tube, Daily   atorvastatin (LIPITOR) 40 MG tablet TAKE 1 TABLET BY MOUTH DAILY   azelastine (ASTELIN) 0.1 % nasal spray 2 sprays, Each Nare, Daily at bedtime   Blood Glucose Monitoring Suppl (ACCU-CHEK AVIVA PLUS) w/Device KIT Use to check blood sugar   fish oil-omega-3 fatty acids 2 g, Per Tube, Daily   fluticasone (FLONASE) 50 MCG/ACT nasal spray Each Nare, Daily   glucose blood (ACCU-CHEK AVIVA) test strip Check blood sugar no more than twice daily   levothyroxine (SYNTHROID) 88 MCG tablet TAKE 1 TABLET BY MOUTH DAILY  BEFORE BREAKFAST   lisinopril (ZESTRIL) 2.5 MG tablet TAKE 1 TABLET BY MOUTH DAILY   melatonin 1 mg, Per Tube, Daily   metoprolol tartrate (LOPRESSOR) 25 MG tablet TAKE 1 TABLET BY MOUTH TWICE  DAILY   Multiple Vitamin (MULTIVITAMIN) tablet 1 tablet, Per Tube, Daily   mupirocin cream (BACTROBAN) 2 % 1  application , Topical, As needed, Reported on 01/27/2016   riluzole (RILUTEK) 50 mg, Per Tube, Every 12 hours   sildenafil (REVATIO) 20 MG tablet TAKE 2 TO 3 TABLETS BY MOUTH ONCE DAILY AS NEEDED   triamcinolone cream (KENALOG) 0.1 % No dose, route, or frequency recorded.       Objective:   Physical Exam BP 138/60   Pulse 63   Temp 98 F (36.7 C) (Oral)   Resp 18   Ht '6\' 4"'  (1.93 m)   Wt 231 lb 8 oz (105 kg)   SpO2 98%   BMI 28.18 kg/m  General:   Well developed, NAD, BMI noted. HEENT:  Normocephalic . Face symmetric, atraumatic Lungs:  CTA B Normal respiratory effort, no intercostal retractions, no accessory muscle use. Heart: RRR,  no murmur.  Skin: Not pale. Not jaundice Neurologic:  alert & oriented X3.  Gait assisted by a rollator.  Did not talk today, we communicate via writing Psych--  Cognition and judgment appear intact.  Cooperative with normal attention span and concentration.  Behavior appropriate. No anxious or depressed appearing.      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  PFTs 09/2021 CV: --CAD:  Coronary Ca+ noted on c  CT scan chest 10-2020 -- PVCs- on BB ALS Dx ~ 10-2021   PLAN:  ALS: To see neurology soon, seems to be doing okay with a rollator, no recent falls, still driving some.  Lives at home with wife.  Reports that all of his needs are covered at this point. Nutrition is via PEG, he is getting help for regards nutrition elsewhere. DM: Ambulatory CBG in the morning 103, 110.  Postprandial in the 160s. Check a CMP and A1c.  Continue with diet control. Hypothyroidism: On Synthroid, check TSH Preventive care: Flu shot today, see AVS. RTC 4 to 5 months

## 2022-07-07 NOTE — Assessment & Plan Note (Signed)
ALS: To see neurology soon, seems to be doing okay with a rollator, no recent falls, still driving some.  Lives at home with wife.  Reports that all of his needs are covered at this point. Nutrition is via PEG, he is getting help for regards nutrition elsewhere. DM: Ambulatory CBG in the morning 103, 110.  Postprandial in the 160s. Check a CMP and A1c.  Continue with diet control. Hypothyroidism: On Synthroid, check TSH Preventive care: Flu shot today, see AVS. RTC 4 to 5 months

## 2022-07-09 DIAGNOSIS — G1221 Amyotrophic lateral sclerosis: Secondary | ICD-10-CM | POA: Diagnosis not present

## 2022-07-10 ENCOUNTER — Encounter: Payer: Self-pay | Admitting: Internal Medicine

## 2022-07-13 NOTE — Telephone Encounter (Signed)
Claiborne Billings the nutritionist from [facility] called and was unsure if we had any comments/questions regarding pt. Confirmed phone and fax and she also left her email, [email or web address]

## 2022-07-14 DIAGNOSIS — D329 Benign neoplasm of meninges, unspecified: Secondary | ICD-10-CM | POA: Diagnosis not present

## 2022-07-14 DIAGNOSIS — R1310 Dysphagia, unspecified: Secondary | ICD-10-CM | POA: Diagnosis not present

## 2022-07-14 DIAGNOSIS — G1221 Amyotrophic lateral sclerosis: Secondary | ICD-10-CM | POA: Diagnosis not present

## 2022-07-15 DIAGNOSIS — Z713 Dietary counseling and surveillance: Secondary | ICD-10-CM | POA: Diagnosis not present

## 2022-07-15 DIAGNOSIS — K5909 Other constipation: Secondary | ICD-10-CM | POA: Diagnosis not present

## 2022-07-15 DIAGNOSIS — Z931 Gastrostomy status: Secondary | ICD-10-CM | POA: Diagnosis not present

## 2022-07-15 DIAGNOSIS — G1221 Amyotrophic lateral sclerosis: Secondary | ICD-10-CM | POA: Diagnosis not present

## 2022-07-15 DIAGNOSIS — Z79899 Other long term (current) drug therapy: Secondary | ICD-10-CM | POA: Diagnosis not present

## 2022-07-15 NOTE — Telephone Encounter (Signed)
Please fax results to the nutritions with a note: I'm  concern about the low sodium.  He probably needs to take less free water via PEG. Thank you

## 2022-07-15 NOTE — Telephone Encounter (Signed)
Results faxed to Dr. Vallarie Mare and Rosario Adie, nutritionist.

## 2022-07-22 ENCOUNTER — Encounter: Payer: Self-pay | Admitting: Internal Medicine

## 2022-07-22 DIAGNOSIS — E871 Hypo-osmolality and hyponatremia: Secondary | ICD-10-CM

## 2022-07-23 NOTE — Addendum Note (Signed)
Addended byDamita Dunnings D on: 07/23/2022 01:16 PM   Modules accepted: Orders

## 2022-07-23 NOTE — Telephone Encounter (Signed)
See message, please arrange a BMP in 3 to 4 weeks. JP

## 2022-07-28 DIAGNOSIS — G1221 Amyotrophic lateral sclerosis: Secondary | ICD-10-CM | POA: Diagnosis not present

## 2022-07-28 DIAGNOSIS — R1310 Dysphagia, unspecified: Secondary | ICD-10-CM | POA: Diagnosis not present

## 2022-07-30 ENCOUNTER — Encounter: Payer: Self-pay | Admitting: Internal Medicine

## 2022-08-08 DIAGNOSIS — G1221 Amyotrophic lateral sclerosis: Secondary | ICD-10-CM | POA: Diagnosis not present

## 2022-08-11 ENCOUNTER — Other Ambulatory Visit (INDEPENDENT_AMBULATORY_CARE_PROVIDER_SITE_OTHER): Payer: Medicare Other

## 2022-08-11 DIAGNOSIS — E871 Hypo-osmolality and hyponatremia: Secondary | ICD-10-CM

## 2022-08-12 LAB — BASIC METABOLIC PANEL
BUN: 28 mg/dL — ABNORMAL HIGH (ref 6–23)
CO2: 29 mEq/L (ref 19–32)
Calcium: 9.7 mg/dL (ref 8.4–10.5)
Chloride: 96 mEq/L (ref 96–112)
Creatinine, Ser: 1.02 mg/dL (ref 0.40–1.50)
GFR: 74.7 mL/min (ref >60.00)
Glucose, Bld: 224 mg/dL — ABNORMAL HIGH (ref 70–99)
Potassium: 4.4 mEq/L (ref 3.5–5.1)
Sodium: 135 mEq/L (ref 135–145)

## 2022-09-04 ENCOUNTER — Encounter: Payer: Self-pay | Admitting: Internal Medicine

## 2022-09-04 DIAGNOSIS — G1221 Amyotrophic lateral sclerosis: Secondary | ICD-10-CM | POA: Diagnosis not present

## 2022-09-04 DIAGNOSIS — R1310 Dysphagia, unspecified: Secondary | ICD-10-CM | POA: Diagnosis not present

## 2022-09-08 DIAGNOSIS — G1221 Amyotrophic lateral sclerosis: Secondary | ICD-10-CM | POA: Diagnosis not present

## 2022-10-07 DIAGNOSIS — G1221 Amyotrophic lateral sclerosis: Secondary | ICD-10-CM | POA: Diagnosis not present

## 2022-10-07 DIAGNOSIS — R1310 Dysphagia, unspecified: Secondary | ICD-10-CM | POA: Diagnosis not present

## 2022-10-08 DIAGNOSIS — G1221 Amyotrophic lateral sclerosis: Secondary | ICD-10-CM | POA: Diagnosis not present

## 2022-10-14 DIAGNOSIS — Z79899 Other long term (current) drug therapy: Secondary | ICD-10-CM | POA: Diagnosis not present

## 2022-10-14 DIAGNOSIS — R296 Repeated falls: Secondary | ICD-10-CM | POA: Diagnosis not present

## 2022-10-14 DIAGNOSIS — Z931 Gastrostomy status: Secondary | ICD-10-CM | POA: Diagnosis not present

## 2022-10-14 DIAGNOSIS — R531 Weakness: Secondary | ICD-10-CM | POA: Diagnosis not present

## 2022-10-14 DIAGNOSIS — M21371 Foot drop, right foot: Secondary | ICD-10-CM | POA: Diagnosis not present

## 2022-10-14 DIAGNOSIS — G1221 Amyotrophic lateral sclerosis: Secondary | ICD-10-CM | POA: Diagnosis not present

## 2022-10-23 ENCOUNTER — Encounter: Payer: Self-pay | Admitting: Internal Medicine

## 2022-10-27 NOTE — Telephone Encounter (Signed)
Forms received Placed in [place] bin up front  Would like to be called at [phone], when it is ready for pick up

## 2022-10-30 NOTE — Telephone Encounter (Signed)
Received fax confirmation

## 2022-10-30 NOTE — Telephone Encounter (Signed)
Form faxed to [facility] at [phone]. Form sent for scanning. Copy of form mailed to Pt.

## 2022-11-10 ENCOUNTER — Ambulatory Visit: Payer: Medicare Other | Admitting: Internal Medicine

## 2022-12-08 ENCOUNTER — Other Ambulatory Visit: Payer: Self-pay | Admitting: Internal Medicine

## 2022-12-08 ENCOUNTER — Ambulatory Visit (INDEPENDENT_AMBULATORY_CARE_PROVIDER_SITE_OTHER): Payer: Medicare Other | Admitting: Internal Medicine

## 2022-12-08 ENCOUNTER — Encounter: Payer: Self-pay | Admitting: Internal Medicine

## 2022-12-08 VITALS — BP 108/60 | HR 58 | Temp 98.1°F | Resp 18 | Ht 76.0 in

## 2022-12-08 DIAGNOSIS — E039 Hypothyroidism, unspecified: Secondary | ICD-10-CM

## 2022-12-08 DIAGNOSIS — G1221 Amyotrophic lateral sclerosis: Secondary | ICD-10-CM

## 2022-12-08 DIAGNOSIS — E119 Type 2 diabetes mellitus without complications: Secondary | ICD-10-CM | POA: Diagnosis not present

## 2022-12-08 DIAGNOSIS — E785 Hyperlipidemia, unspecified: Secondary | ICD-10-CM

## 2022-12-08 NOTE — Telephone Encounter (Signed)
Appt later today.

## 2022-12-08 NOTE — Patient Instructions (Addendum)
It was good to see you today    GO TO THE LAB : Get the blood work     [place] Come back for a physical exam in 4 to 5 months

## 2022-12-08 NOTE — Progress Notes (Unsigned)
Subjective:    Patient ID: [name] [name], male    DOB: June 25, 1951, 72 y.o.   MRN: [id number]  DOS:  12/08/2022 Type of visit - description: Follow-up, here with his wife [name] [name]  Since the last visit, was seen at the [facility], note reviewed. In general things are stable. The wife has noted a discoloration of the lower back. Good med compliance.   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   Colon polyps    Colon polyps at age 47, next Cscope age 21   Diabetes mellitus    DJD (degenerative joint disease)    Glaucoma suspect    Hyperlipidemia     Past Surgical History:  Procedure Laterality Date   BUNIONECTOMY Right 12/27/2014   [facility]   BUNIONECTOMY Left    2019   ESOPHAGEAL MANOMETRY N/A 07/23/2021   Procedure: ESOPHAGEAL MANOMETRY (EM);  Surgeon: Wilford Corner, MD;  Location: [facility];  Service: Endoscopy;  Laterality: N/A;   NASAL SEPTUM SURGERY  2000   PEG TUBE PLACEMENT     SHOULDER ARTHROSCOPY     L 2011, R 2012    Current Outpatient Medications  Medication Instructions   Accu-Chek Softclix Lancets lancets Use as instructed   aspirin 81 mg, Per Tube, Daily   atorvastatin (LIPITOR) 40 MG tablet TAKE 1 TABLET BY MOUTH DAILY   azelastine (ASTELIN) 0.1 % nasal spray 2 sprays, Each Nare, Daily at bedtime   Blood Glucose Monitoring Suppl (ACCU-CHEK AVIVA PLUS) w/Device KIT Use to check blood sugar   fish oil-omega-3 fatty acids 2 g, Daily   fluticasone (FLONASE) 50 MCG/ACT nasal spray Each Nare, Daily   glucose blood (ACCU-CHEK AVIVA) test strip Check blood sugar no more than twice daily   levothyroxine (SYNTHROID) 88 MCG tablet TAKE 1 TABLET BY MOUTH DAILY  BEFORE BREAKFAST   lisinopril (ZESTRIL) 2.5 MG tablet TAKE 1 TABLET BY MOUTH DAILY   melatonin 1 mg, Per Tube, Daily   metoprolol tartrate (LOPRESSOR) 25 MG tablet TAKE 1 TABLET BY MOUTH TWICE  DAILY   Multiple Vitamin (MULTIVITAMIN)  tablet 1 tablet, Daily   mupirocin cream (BACTROBAN) 2 % 1 application , Topical, As needed, Reported on 01/27/2016   riluzole (RILUTEK) 50 mg, Per Tube, Every 12 hours   sildenafil (REVATIO) 20 MG tablet TAKE 2 TO 3 TABLETS BY MOUTH ONCE DAILY AS NEEDED   triamcinolone cream (KENALOG) 0.1 % No dose, route, or frequency recorded.       Objective:   Physical Exam Skin:         Comments: Ecchymosis noted on the lower back, no warmness or particular tenderness.   BP 108/60   Pulse (!) 58   Temp 98.1 F (36.7 C) (Oral)   Resp 18   Ht 6' 4"$  (1.93 m)   SpO2 97%   BMI 28.18 kg/m  General:   No distress.  Seems very comfortable in a power wheelchair. HEENT:  Normocephalic . Face symmetric, atraumatic Lungs:  CTA B Normal respiratory effort, no intercostal retractions, no accessory muscle use. Heart: RRR,  no murmur.  Lower extremities: no pretibial edema bilaterally  Skin: see graphic Neurologic:  alert & oriented X3.  Communicates writing.  Gait not tested  psych--  Behavior appropriate. No anxious or depressed appearing.      Assessment     Assessment DM- on ACEi Hyperlipidemia Hypothyroidism DJD Pre-glaucoma E.D. Chronic R foot pain, s/p surgery  w/ podiatry 2016 Pulmonary: --COPD per CT for lung ca screening --Asthma.  PFTs 09/2021 CV: --CAD: Coronary Ca+ noted on c  CT scan chest 10-2020 -- PVCs- on BB ALS Dx ~ 10-2021   PLAN:  DM: On lisinopril, last BMP okay, check A1c. High cholesterol: History of CAD, on atorvastatin, check FLP.  Recent LFTs satisfactory Hypothyroidism: on Synthroid, check TSH ALS: Saw neurology 10/14/2022. - Had a PEG placed - High risk of falls, sent to PT. - Recommended power wheelchair - Creatinine 0.9 Social: Here with his wife [name] [name], she is be very supportive involved on his care. RTC 4 to 5 months CPX

## 2022-12-09 LAB — LIPID PANEL
Cholesterol: 138 mg/dL (ref 0–200)
HDL: 46.4 mg/dL (ref >39.00)
LDL Cholesterol: 77 mg/dL (ref 0–99)
NonHDL: 92.03
Total CHOL/HDL Ratio: 3
Triglycerides: 75 mg/dL (ref 0.0–149.0)
VLDL: 15 mg/dL (ref 0.0–40.0)

## 2022-12-09 LAB — HEMOGLOBIN A1C: Hgb A1c MFr Bld: 8 % — ABNORMAL HIGH (ref 4.6–6.5)

## 2022-12-09 LAB — TSH: TSH: 4.63 u[IU]/mL (ref 0.35–5.50)

## 2022-12-09 NOTE — Assessment & Plan Note (Signed)
DM: On lisinopril, last BMP okay, check A1c. High cholesterol: History of CAD, on atorvastatin, check FLP.  Recent LFTs satisfactory Hypothyroidism: on Synthroid, check TSH ALS: Saw neurology 10/14/2022. - Had a PEG placed - High risk of falls, sent to PT. - Recommended power wheelchair - Creatinine 0.9 Social: Here with his wife Izora Gala, she is be very supportive involved on his care. RTC 4 to 5 months CPX

## 2022-12-11 MED ORDER — METFORMIN HCL 500 MG/5ML PO SOLN: 5.0000 mL | Freq: Two times a day (BID) | ORAL | 1 refills | Status: DC

## 2022-12-11 NOTE — Addendum Note (Signed)
Addended byDamita Dunnings D on: 12/11/2022 12:38 PM   Modules accepted: Orders

## 2022-12-14 LAB — HM DIABETES EYE EXAM

## 2022-12-17 ENCOUNTER — Other Ambulatory Visit (HOSPITAL_BASED_OUTPATIENT_CLINIC_OR_DEPARTMENT_OTHER): Payer: Self-pay

## 2022-12-17 ENCOUNTER — Telehealth: Payer: Self-pay | Admitting: Internal Medicine

## 2022-12-17 NOTE — Telephone Encounter (Signed)
Pt needs prescription for Metformin HCl 500 MG/5ML SOLN  to be sent to [place]. [facility] does not have it in stock due to back order.

## 2022-12-18 ENCOUNTER — Other Ambulatory Visit (HOSPITAL_BASED_OUTPATIENT_CLINIC_OR_DEPARTMENT_OTHER): Payer: Self-pay

## 2022-12-18 MED ORDER — METFORMIN HCL 500 MG/5ML PO SOLN
5.0000 mL | Freq: Two times a day (BID) | ORAL | 0 refills | Status: DC
  Filled 2022-12-18: qty 473, 47d supply, fill #0

## 2022-12-18 NOTE — Telephone Encounter (Signed)
Rx sent to [facility]

## 2022-12-18 NOTE — Telephone Encounter (Signed)
Initial Comment Caller states her husband needs a prescription for Metformin. Caller states the prescription has not been received at the pharmacy. Caller states the pharmacy closes at 6pm. Translation No Nurse Assessment Nurse: Velta Addison, RN, Helene Kelp Date/Time Eilene Ghazi Time): 12/17/2022 5:13:48 PM Confirm and document reason for call. If symptomatic, describe symptoms. ---Caller states that husband was prescribed metformin 2/16. Reports that he was prescribed soluble metformin(for g-tube) and it was sent to a delivery service. The delivery service is out of medication and caller requested rx be sent to a local pharmacy. She has spoke with the office, but the pharmacy has not received the RX. No new or worsening symptoms. Does the patient have any new or worsening symptoms? ---No Please document clinical information provided and list any resource used. ---Advised to call in the AM when the office is open. Disp. Time Eilene Ghazi Time) Disposition Final User 12/17/2022 5:19:07 PM Clinical Call Yes Velta Addison, RN, Helene Kelp Final Disposition 12/17/2022 5:19:07 PM Clinical Call Yes Velta Addison, RN, Helene Kelp

## 2022-12-23 ENCOUNTER — Encounter: Payer: Self-pay | Admitting: Internal Medicine

## 2023-01-15 ENCOUNTER — Ambulatory Visit (HOSPITAL_BASED_OUTPATIENT_CLINIC_OR_DEPARTMENT_OTHER): Payer: Medicare Other

## 2023-01-24 ENCOUNTER — Inpatient Hospital Stay (HOSPITAL_COMMUNITY)
Admission: EM | Admit: 2023-01-24 | Discharge: 2023-01-29 | DRG: 280 | Disposition: A | Discharge status: Home Health | Payer: Medicare Other | Attending: Internal Medicine | Admitting: Internal Medicine

## 2023-01-24 ENCOUNTER — Other Ambulatory Visit: Payer: Self-pay

## 2023-01-24 ENCOUNTER — Emergency Department (HOSPITAL_COMMUNITY): Payer: Medicare Other

## 2023-01-24 DIAGNOSIS — E440 Moderate protein-calorie malnutrition: Secondary | ICD-10-CM | POA: Diagnosis present

## 2023-01-24 DIAGNOSIS — J9621 Acute and chronic respiratory failure with hypoxia: Secondary | ICD-10-CM | POA: Diagnosis not present

## 2023-01-24 DIAGNOSIS — J159 Unspecified bacterial pneumonia: Secondary | ICD-10-CM | POA: Diagnosis present

## 2023-01-24 DIAGNOSIS — Z79899 Other long term (current) drug therapy: Secondary | ICD-10-CM | POA: Diagnosis not present

## 2023-01-24 DIAGNOSIS — Z8261 Family history of arthritis: Secondary | ICD-10-CM | POA: Diagnosis not present

## 2023-01-24 DIAGNOSIS — R1030 Lower abdominal pain, unspecified: Secondary | ICD-10-CM | POA: Diagnosis not present

## 2023-01-24 DIAGNOSIS — R1310 Dysphagia, unspecified: Secondary | ICD-10-CM | POA: Diagnosis present

## 2023-01-24 DIAGNOSIS — J9611 Chronic respiratory failure with hypoxia: Secondary | ICD-10-CM | POA: Diagnosis present

## 2023-01-24 DIAGNOSIS — E1165 Type 2 diabetes mellitus with hyperglycemia: Secondary | ICD-10-CM | POA: Diagnosis present

## 2023-01-24 DIAGNOSIS — K219 Gastro-esophageal reflux disease without esophagitis: Secondary | ICD-10-CM | POA: Diagnosis present

## 2023-01-24 DIAGNOSIS — Z6828 Body mass index (BMI) 28.0-28.9, adult: Secondary | ICD-10-CM

## 2023-01-24 DIAGNOSIS — I5181 Takotsubo syndrome: Secondary | ICD-10-CM | POA: Diagnosis present

## 2023-01-24 DIAGNOSIS — Z87891 Personal history of nicotine dependence: Secondary | ICD-10-CM | POA: Diagnosis not present

## 2023-01-24 DIAGNOSIS — I255 Ischemic cardiomyopathy: Secondary | ICD-10-CM | POA: Diagnosis present

## 2023-01-24 DIAGNOSIS — E782 Mixed hyperlipidemia: Secondary | ICD-10-CM | POA: Diagnosis present

## 2023-01-24 DIAGNOSIS — Z66 Do not resuscitate: Secondary | ICD-10-CM | POA: Diagnosis present

## 2023-01-24 DIAGNOSIS — Z7982 Long term (current) use of aspirin: Secondary | ICD-10-CM

## 2023-01-24 DIAGNOSIS — J189 Pneumonia, unspecified organism: Secondary | ICD-10-CM | POA: Diagnosis present

## 2023-01-24 DIAGNOSIS — I2109 ST elevation (STEMI) myocardial infarction involving other coronary artery of anterior wall: Secondary | ICD-10-CM

## 2023-01-24 DIAGNOSIS — I5021 Acute systolic (congestive) heart failure: Secondary | ICD-10-CM | POA: Diagnosis present

## 2023-01-24 DIAGNOSIS — Z801 Family history of malignant neoplasm of trachea, bronchus and lung: Secondary | ICD-10-CM

## 2023-01-24 DIAGNOSIS — Z931 Gastrostomy status: Secondary | ICD-10-CM

## 2023-01-24 DIAGNOSIS — M159 Polyosteoarthritis, unspecified: Secondary | ICD-10-CM | POA: Diagnosis not present

## 2023-01-24 DIAGNOSIS — G1221 Amyotrophic lateral sclerosis: Secondary | ICD-10-CM | POA: Diagnosis present

## 2023-01-24 DIAGNOSIS — E878 Other disorders of electrolyte and fluid balance, not elsewhere classified: Secondary | ICD-10-CM | POA: Diagnosis present

## 2023-01-24 DIAGNOSIS — I213 ST elevation (STEMI) myocardial infarction of unspecified site: Secondary | ICD-10-CM | POA: Diagnosis present

## 2023-01-24 DIAGNOSIS — R000 Tachycardia, unspecified: Secondary | ICD-10-CM | POA: Diagnosis present

## 2023-01-24 DIAGNOSIS — Z820 Family history of epilepsy and other diseases of the nervous system: Secondary | ICD-10-CM

## 2023-01-24 DIAGNOSIS — Z79890 Hormone replacement therapy (postmenopausal): Secondary | ICD-10-CM

## 2023-01-24 DIAGNOSIS — I4510 Unspecified right bundle-branch block: Secondary | ICD-10-CM | POA: Diagnosis present

## 2023-01-24 DIAGNOSIS — J449 Chronic obstructive pulmonary disease, unspecified: Secondary | ICD-10-CM | POA: Diagnosis not present

## 2023-01-24 DIAGNOSIS — E039 Hypothyroidism, unspecified: Secondary | ICD-10-CM | POA: Diagnosis present

## 2023-01-24 DIAGNOSIS — Z823 Family history of stroke: Secondary | ICD-10-CM

## 2023-01-24 DIAGNOSIS — R0602 Shortness of breath: Secondary | ICD-10-CM

## 2023-01-24 DIAGNOSIS — R338 Other retention of urine: Secondary | ICD-10-CM | POA: Diagnosis not present

## 2023-01-24 DIAGNOSIS — Z7984 Long term (current) use of oral hypoglycemic drugs: Secondary | ICD-10-CM

## 2023-01-24 DIAGNOSIS — R319 Hematuria, unspecified: Secondary | ICD-10-CM | POA: Diagnosis not present

## 2023-01-24 DIAGNOSIS — I2510 Atherosclerotic heart disease of native coronary artery without angina pectoris: Secondary | ICD-10-CM | POA: Diagnosis present

## 2023-01-24 DIAGNOSIS — Z833 Family history of diabetes mellitus: Secondary | ICD-10-CM

## 2023-01-24 DIAGNOSIS — R809 Proteinuria, unspecified: Secondary | ICD-10-CM | POA: Diagnosis present

## 2023-01-24 DIAGNOSIS — J440 Chronic obstructive pulmonary disease with acute lower respiratory infection: Secondary | ICD-10-CM | POA: Diagnosis present

## 2023-01-24 DIAGNOSIS — I249 Acute ischemic heart disease, unspecified: Principal | ICD-10-CM

## 2023-01-24 DIAGNOSIS — E871 Hypo-osmolality and hyponatremia: Secondary | ICD-10-CM | POA: Diagnosis present

## 2023-01-24 DIAGNOSIS — I10 Essential (primary) hypertension: Secondary | ICD-10-CM | POA: Diagnosis not present

## 2023-01-24 DIAGNOSIS — M1990 Unspecified osteoarthritis, unspecified site: Secondary | ICD-10-CM | POA: Diagnosis present

## 2023-01-24 DIAGNOSIS — N390 Urinary tract infection, site not specified: Secondary | ICD-10-CM | POA: Diagnosis present

## 2023-01-24 DIAGNOSIS — Z86010 Personal history of colonic polyps: Secondary | ICD-10-CM

## 2023-01-24 LAB — LIPID PANEL
Cholesterol: 121 mg/dL (ref 0–200)
HDL: 52 mg/dL (ref >40)
LDL Cholesterol: 63 mg/dL (ref 0–99)
Total CHOL/HDL Ratio: 2.3 RATIO
Triglycerides: 29 mg/dL (ref <150)
VLDL: 6 mg/dL (ref 0–40)

## 2023-01-24 LAB — BASIC METABOLIC PANEL
Anion gap: 13 (ref 5–15)
BUN: 24 mg/dL — ABNORMAL HIGH (ref 8–23)
CO2: 25 mmol/L (ref 22–32)
Calcium: 9.3 mg/dL (ref 8.9–10.3)
Chloride: 87 mmol/L — ABNORMAL LOW (ref 98–111)
Creatinine, Ser: 0.78 mg/dL (ref 0.61–1.24)
GFR, Estimated: >60 mL/min (ref >60)
Glucose, Bld: 183 mg/dL — ABNORMAL HIGH (ref 70–99)
Potassium: 4.3 mmol/L (ref 3.5–5.1)
Sodium: 125 mmol/L — ABNORMAL LOW (ref 135–145)

## 2023-01-24 LAB — HEPARIN LEVEL (UNFRACTIONATED): Heparin Unfractionated: 0.64 IU/mL (ref 0.30–0.70)

## 2023-01-24 LAB — HEPATIC FUNCTION PANEL
ALT: 55 U/L — ABNORMAL HIGH (ref 0–44)
AST: 49 U/L — ABNORMAL HIGH (ref 15–41)
Albumin: 3.7 g/dL (ref 3.5–5.0)
Alkaline Phosphatase: 104 U/L (ref 38–126)
Bilirubin, Direct: 0.3 mg/dL — ABNORMAL HIGH (ref 0.0–0.2)
Indirect Bilirubin: 0.9 mg/dL (ref 0.3–0.9)
Total Bilirubin: 1.2 mg/dL (ref 0.3–1.2)
Total Protein: 6.8 g/dL (ref 6.5–8.1)

## 2023-01-24 LAB — TROPONIN I (HIGH SENSITIVITY)
Troponin I (High Sensitivity): 1054 ng/L (ref <18)
Troponin I (High Sensitivity): 941 ng/L (ref <18)

## 2023-01-24 LAB — BLOOD GAS, ARTERIAL
Acid-Base Excess: 4.1 mmol/L — ABNORMAL HIGH (ref 0.0–2.0)
Bicarbonate: 28.5 mmol/L — ABNORMAL HIGH (ref 20.0–28.0)
O2 Saturation: 100 %
Patient temperature: 36.5
pCO2 arterial: 40 mmHg (ref 32–48)
pH, Arterial: 7.46 — ABNORMAL HIGH (ref 7.35–7.45)
pO2, Arterial: 98 mmHg (ref 83–108)

## 2023-01-24 LAB — I-STAT CHEM 8, ED
BUN: 29 mg/dL — ABNORMAL HIGH (ref 8–23)
Calcium, Ion: 1.1 mmol/L — ABNORMAL LOW (ref 1.15–1.40)
Chloride: 89 mmol/L — ABNORMAL LOW (ref 98–111)
Creatinine, Ser: 0.7 mg/dL (ref 0.61–1.24)
Glucose, Bld: 176 mg/dL — ABNORMAL HIGH (ref 70–99)
HCT: 46 % (ref 39.0–52.0)
Hemoglobin: 15.6 g/dL (ref 13.0–17.0)
Potassium: 4.3 mmol/L (ref 3.5–5.1)
Sodium: 125 mmol/L — ABNORMAL LOW (ref 135–145)
TCO2: 28 mmol/L (ref 22–32)

## 2023-01-24 LAB — CBC WITH DIFFERENTIAL/PLATELET
Abs Immature Granulocytes: 0 10*3/uL (ref 0.00–0.07)
Basophils Absolute: 0 10*3/uL (ref 0.0–0.1)
Basophils Relative: 0 %
Eosinophils Absolute: 0.1 10*3/uL (ref 0.0–0.5)
Eosinophils Relative: 1 %
HCT: 41.4 % (ref 39.0–52.0)
Hemoglobin: 14.6 g/dL (ref 13.0–17.0)
Lymphocytes Relative: 13 %
Lymphs Abs: 1.6 10*3/uL (ref 0.7–4.0)
MCH: 33 pg (ref 26.0–34.0)
MCHC: 35.3 g/dL (ref 30.0–36.0)
MCV: 93.5 fL (ref 80.0–100.0)
Monocytes Absolute: 0.5 10*3/uL (ref 0.1–1.0)
Monocytes Relative: 4 %
Neutro Abs: 9.8 10*3/uL — ABNORMAL HIGH (ref 1.7–7.7)
Neutrophils Relative %: 82 %
Platelets: 212 10*3/uL (ref 150–400)
RBC: 4.43 MIL/uL (ref 4.22–5.81)
RDW: 12.3 % (ref 11.5–15.5)
WBC: 12 10*3/uL — ABNORMAL HIGH (ref 4.0–10.5)
nRBC: 0 % (ref 0.0–0.2)
nRBC: 0 /100 WBC

## 2023-01-24 LAB — BRAIN NATRIURETIC PEPTIDE: B Natriuretic Peptide: 520.9 pg/mL — ABNORMAL HIGH (ref 0.0–100.0)

## 2023-01-24 LAB — TSH: TSH: 9.787 u[IU]/mL — ABNORMAL HIGH (ref 0.350–4.500)

## 2023-01-24 LAB — CBG MONITORING, ED: Glucose-Capillary: 207 mg/dL — ABNORMAL HIGH (ref 70–99)

## 2023-01-24 LAB — GLUCOSE, CAPILLARY: Glucose-Capillary: 209 mg/dL — ABNORMAL HIGH (ref 70–99)

## 2023-01-24 LAB — HIV ANTIBODY (ROUTINE TESTING W REFLEX): HIV Screen 4th Generation wRfx: NONREACTIVE

## 2023-01-24 LAB — MAGNESIUM: Magnesium: 1.8 mg/dL (ref 1.7–2.4)

## 2023-01-24 MED ORDER — METOPROLOL TARTRATE 25 MG PO TABS
25.0000 mg | ORAL_TABLET | Freq: Two times a day (BID) | ORAL | Status: DC
  Administered 2023-01-24 – 2023-01-26 (×5): 25 mg
  Filled 2023-01-24 (×5): qty 1

## 2023-01-24 MED ORDER — ASPIRIN 81 MG PO CHEW: 81.0000 mg | CHEWABLE_TABLET | Freq: Every day | ORAL | Status: DC

## 2023-01-24 MED ORDER — GUAIFENESIN-DM 100-10 MG/5ML PO SYRP
5.0000 mL | ORAL_SOLUTION | ORAL | Status: DC | PRN
  Administered 2023-01-26 – 2023-01-29 (×9): 5 mL
  Filled 2023-01-24 (×9): qty 5

## 2023-01-24 MED ORDER — SODIUM CHLORIDE 0.9 % IV SOLN
500.0000 mg | Freq: Once | INTRAVENOUS | Status: AC
  Administered 2023-01-24: 500 mg via INTRAVENOUS
  Filled 2023-01-24: qty 5

## 2023-01-24 MED ORDER — JEVITY 1.2 CAL PO LIQD
1000.0000 mL | ORAL | Status: DC
  Administered 2023-01-25: 1000 mL
  Filled 2023-01-24: qty 1000

## 2023-01-24 MED ORDER — DM-GUAIFENESIN ER 30-600 MG PO TB12: 1.0000 | ORAL_TABLET | Freq: Two times a day (BID) | ORAL | Status: DC

## 2023-01-24 MED ORDER — ONDANSETRON HCL 4 MG/2ML IJ SOLN
4.0000 mg | Freq: Four times a day (QID) | INTRAMUSCULAR | Status: DC | PRN
  Administered 2023-01-24: 4 mg via INTRAVENOUS
  Filled 2023-01-24 (×2): qty 2

## 2023-01-24 MED ORDER — GLYCOPYRROLATE 1 MG PO TABS
2.0000 mg | ORAL_TABLET | Freq: Three times a day (TID) | ORAL | Status: DC
  Administered 2023-01-24 – 2023-01-29 (×14): 2 mg
  Filled 2023-01-24 (×19): qty 2

## 2023-01-24 MED ORDER — FENTANYL CITRATE PF 50 MCG/ML IJ SOSY
25.0000 ug | PREFILLED_SYRINGE | INTRAMUSCULAR | Status: DC | PRN
  Administered 2023-01-25 – 2023-01-29 (×6): 25 ug via INTRAVENOUS
  Filled 2023-01-24 (×7): qty 1

## 2023-01-24 MED ORDER — ASPIRIN 81 MG PO CHEW
324.0000 mg | CHEWABLE_TABLET | Freq: Once | ORAL | Status: AC
  Administered 2023-01-24: 324 mg
  Filled 2023-01-24: qty 4

## 2023-01-24 MED ORDER — SODIUM CHLORIDE 0.9 % IV SOLN
1.0000 g | Freq: Once | INTRAVENOUS | Status: AC
  Administered 2023-01-24: 1 g via INTRAVENOUS
  Filled 2023-01-24: qty 10

## 2023-01-24 MED ORDER — FREE WATER
100.0000 mL | Freq: Four times a day (QID) | Status: DC
  Administered 2023-01-25 (×2): 100 mL

## 2023-01-24 MED ORDER — MELATONIN 3 MG PO TABS
1.5000 mg | ORAL_TABLET | Freq: Every day | ORAL | Status: DC
  Administered 2023-01-24 – 2023-01-28 (×5): 1.5 mg
  Filled 2023-01-24 (×5): qty 1

## 2023-01-24 MED ORDER — ASPIRIN 81 MG PO CHEW
81.0000 mg | CHEWABLE_TABLET | Freq: Every day | ORAL | Status: DC
  Administered 2023-01-25 – 2023-01-29 (×5): 81 mg
  Filled 2023-01-24 (×5): qty 1

## 2023-01-24 MED ORDER — ATORVASTATIN CALCIUM 40 MG PO TABS
80.0000 mg | ORAL_TABLET | Freq: Every day | ORAL | Status: DC
  Administered 2023-01-24 – 2023-01-29 (×6): 80 mg
  Filled 2023-01-24 (×6): qty 2

## 2023-01-24 MED ORDER — LEVOTHYROXINE SODIUM 88 MCG PO TABS
88.0000 ug | ORAL_TABLET | Freq: Every day | ORAL | Status: DC
  Administered 2023-01-25 – 2023-01-29 (×5): 88 ug
  Filled 2023-01-24 (×5): qty 1

## 2023-01-24 MED ORDER — GLYCOPYRROLATE 1 MG PO TABS
1.0000 mg | ORAL_TABLET | Freq: Three times a day (TID) | ORAL | Status: DC
  Administered 2023-01-24: 1 mg
  Filled 2023-01-24 (×3): qty 1

## 2023-01-24 MED ORDER — FENTANYL CITRATE PF 50 MCG/ML IJ SOSY
50.0000 ug | PREFILLED_SYRINGE | Freq: Once | INTRAMUSCULAR | Status: AC
  Administered 2023-01-24: 50 ug via INTRAVENOUS
  Filled 2023-01-24: qty 1

## 2023-01-24 MED ORDER — RILUZOLE 50 MG PO TABS: 50.0000 mg | ORAL_TABLET | Freq: Two times a day (BID) | ORAL | Status: DC

## 2023-01-24 MED ORDER — CLOPIDOGREL BISULFATE 75 MG PO TABS
600.0000 mg | ORAL_TABLET | Freq: Once | ORAL | Status: AC
  Administered 2023-01-24: 600 mg
  Filled 2023-01-24: qty 8

## 2023-01-24 MED ORDER — MELATONIN 1 MG PO TABS
1.0000 mg | ORAL_TABLET | Freq: Every day | ORAL | Status: DC
  Filled 2023-01-24: qty 1

## 2023-01-24 MED ORDER — CLOPIDOGREL BISULFATE 75 MG PO TABS
75.0000 mg | ORAL_TABLET | Freq: Every day | ORAL | Status: DC
  Administered 2023-01-25 – 2023-01-29 (×5): 75 mg
  Filled 2023-01-24 (×5): qty 1

## 2023-01-24 MED ORDER — LACTATED RINGERS IV SOLN: INTRAVENOUS | Status: DC

## 2023-01-24 MED ORDER — ONDANSETRON HCL 4 MG PO TABS: 4.0000 mg | ORAL_TABLET | Freq: Four times a day (QID) | ORAL | Status: DC | PRN

## 2023-01-24 MED ORDER — LACTATED RINGERS IV BOLUS: 500.0000 mL | Freq: Once | INTRAVENOUS | Status: DC

## 2023-01-24 MED ORDER — HEPARIN BOLUS VIA INFUSION
4000.0000 [IU] | Freq: Once | INTRAVENOUS | Status: AC
  Administered 2023-01-24: 4000 [IU] via INTRAVENOUS
  Filled 2023-01-24: qty 4000

## 2023-01-24 MED ORDER — HYDRALAZINE HCL 20 MG/ML IJ SOLN: 5.0000 mg | Freq: Four times a day (QID) | INTRAMUSCULAR | Status: DC | PRN

## 2023-01-24 MED ORDER — HEPARIN (PORCINE) 25000 UT/250ML-% IV SOLN
1300.0000 [IU]/h | INTRAVENOUS | Status: DC
  Administered 2023-01-24: 1300 [IU]/h via INTRAVENOUS
  Filled 2023-01-24 (×2): qty 250

## 2023-01-24 MED ORDER — SODIUM CHLORIDE 0.9 % IV BOLUS
250.0000 mL | Freq: Once | INTRAVENOUS | Status: AC
  Administered 2023-01-24: 250 mL via INTRAVENOUS

## 2023-01-24 MED ORDER — SODIUM CHLORIDE 0.9 % IV SOLN
2.0000 g | INTRAVENOUS | Status: AC
  Administered 2023-01-25 – 2023-01-28 (×4): 2 g via INTRAVENOUS
  Filled 2023-01-24 (×5): qty 20

## 2023-01-24 MED ORDER — INSULIN ASPART 100 UNIT/ML IJ SOLN
0.0000 [IU] | INTRAMUSCULAR | Status: DC
  Administered 2023-01-24 – 2023-01-25 (×3): 5 [IU] via SUBCUTANEOUS
  Administered 2023-01-25: 2 [IU] via SUBCUTANEOUS
  Administered 2023-01-25: 5 [IU] via SUBCUTANEOUS
  Administered 2023-01-25 (×2): 3 [IU] via SUBCUTANEOUS
  Administered 2023-01-25: 5 [IU] via SUBCUTANEOUS
  Administered 2023-01-26: 1 [IU] via SUBCUTANEOUS
  Administered 2023-01-26: 3 [IU] via SUBCUTANEOUS
  Administered 2023-01-26 (×2): 5 [IU] via SUBCUTANEOUS
  Administered 2023-01-26: 8 [IU] via SUBCUTANEOUS
  Administered 2023-01-27: 11 [IU] via SUBCUTANEOUS
  Administered 2023-01-27: 3 [IU] via SUBCUTANEOUS
  Administered 2023-01-27: 5 [IU] via SUBCUTANEOUS
  Administered 2023-01-27: 11 [IU] via SUBCUTANEOUS
  Administered 2023-01-28: 8 [IU] via SUBCUTANEOUS
  Administered 2023-01-28 (×2): 5 [IU] via SUBCUTANEOUS
  Administered 2023-01-28 (×2): 8 [IU] via SUBCUTANEOUS
  Administered 2023-01-29: 3 [IU] via SUBCUTANEOUS
  Administered 2023-01-29: 5 [IU] via SUBCUTANEOUS
  Administered 2023-01-29: 2 [IU] via SUBCUTANEOUS

## 2023-01-24 NOTE — Progress Notes (Signed)
ANTICOAGULATION CONSULT NOTE - Initial Consult  Pharmacy Consult for Heparin Indication: chest pain/ACS  No Known Allergies  Patient Measurements: Height: 6\' 4"  (193 cm) Weight: 104.3 kg (230 lb) IBW/kg (Calculated) : 86.8 Heparin Dosing Weight: 104 kg  Vital Signs: Temp: 97.6 F (36.4 C) (03/31 1609) Temp Source: Oral (03/31 1609) BP: 139/85 (03/31 1609) Pulse Rate: 91 (03/31 1609)  Labs: Recent Labs    01/24/23 1140 01/24/23 1148 01/24/23 1329 01/24/23 1957  HGB 14.6 15.6  --   --   HCT 41.4 46.0  --   --   PLT 212  --   --   --   HEPARINUNFRC  --   --   --  0.64  CREATININE 0.78 0.70  --   --   TROPONINIHS 1,054*  --  941*  --      Estimated Creatinine Clearance: 114 mL/min (by C-G formula based on SCr of 0.7 mg/dL).   Medical History: 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   Colon polyps    Colon polyps at age 13, next Cscope age 17   Diabetes mellitus    DJD (degenerative joint disease)    Glaucoma suspect    Hyperlipidemia     Medications:  Medications Prior to Admission  Medication Sig Dispense Refill Last Dose   aspirin 81 MG tablet Place 81 mg into feeding tube daily.   01/23/2023   atorvastatin (LIPITOR) 40 MG tablet Place 1 tablet (40 mg total) into feeding tube daily. 90 tablet 1 01/23/2023   azelastine (ASTELIN) 0.1 % nasal spray Place 2 sprays into both nostrils at bedtime. 30 mL 6 unk   glycopyrrolate (ROBINUL) 1 MG tablet Take 2 mg by mouth 3 (three) times daily.   01/24/2023   levothyroxine (SYNTHROID) 88 MCG tablet Place 1 tablet (88 mcg total) into feeding tube daily before breakfast. 90 tablet 1 01/24/2023   lisinopril (ZESTRIL) 2.5 MG tablet Place 1 tablet (2.5 mg total) into feeding tube daily. 90 tablet 1 01/24/2023   Metformin HCl 500 MG/5ML SOLN Place 5 mLs (500 mg total) into feeding tube in the morning and at bedtime. 473 mL 0 01/23/2023   metoprolol tartrate (LOPRESSOR) 25 MG tablet Place 1 tablet (25 mg total)  into feeding tube 2 (two) times daily. 180 tablet 0 01/23/2023 at 4pm   riluzole (RILUTEK) 50 MG tablet Place 50 mg into feeding tube every 12 (twelve) hours.   01/24/2023   triamcinolone cream (KENALOG) 0.1 %   2 01/23/2023   Accu-Chek Softclix Lancets lancets Use as instructed 100 each 12    Blood Glucose Monitoring Suppl (ACCU-CHEK AVIVA PLUS) w/Device KIT Use to check blood sugar 1 kit 0    fluticasone (FLONASE) 50 MCG/ACT nasal spray Place into both nostrils daily. (Patient not taking: Reported on 01/24/2023)   Not Taking   glucose blood (ACCU-CHEK AVIVA) test strip Check blood sugar no more than twice daily 100 each 12    Melatonin 1 MG TABS Place 1 mg into feeding tube daily. (Patient not taking: Reported on 01/24/2023)   Not Taking   Multiple Vitamin (MULTIVITAMIN) tablet Place 1 tablet into feeding tube daily. (Patient not taking: Reported on 12/08/2022)   Not Taking   mupirocin cream (BACTROBAN) 2 % Apply 1 application topically as needed. Reported on 01/27/2016 (Patient not taking: Reported on 01/24/2023)   Not Taking   sildenafil (REVATIO) 20 MG tablet TAKE 2 TO 3 TABLETS BY MOUTH ONCE DAILY AS NEEDED (Patient  not taking: Reported on 01/24/2023) 50 tablet 2 Not Taking    Scheduled:   [START ON 01/25/2023] aspirin  81 mg Per Tube Daily   atorvastatin  80 mg Per Tube Daily   [START ON 01/25/2023] clopidogrel  75 mg Per Tube Daily   glycopyrrolate  2 mg Per Tube TID   insulin aspart  0-15 Units Subcutaneous Q4H   [START ON 01/25/2023] levothyroxine  88 mcg Per Tube QAC breakfast   melatonin  1.5 mg Per Tube QHS   metoprolol tartrate  25 mg Per Tube BID   riluzole  50 mg Per Tube Q12H   Infusions:   [START ON 01/25/2023] cefTRIAXone (ROCEPHIN)  IV     heparin 1,300 Units/hr (01/24/23 1352)   PRN: fentaNYL (SUBLIMAZE) injection, guaiFENesin-dextromethorphan, ondansetron **OR** ondansetron (ZOFRAN) IV  Assessment: 72 yom with a history of T2DM, HLD, COPD, CAD, GERD, ALS. Patient is presenting with  SOB. Heparin per pharmacy consult placed for chest pain/ACS.  Patient is not on anticoagulation prior to arrival.  Hgb 15.6; plt 212 hsTrop 1054  Heparin level came back therapeutic tonight. We will cont with the current rate and check confirm in AM.  Goal of Therapy:  Heparin level 0.3-0.7 units/ml Monitor platelets by anticoagulation protocol: Yes   Plan:  Cont heparin infusion at 1300 units/hr Check anti-Xa level in AM Daily heparin level while on heparin Continue to monitor H&H and platelets  Onnie Boer, PharmD, BCIDP, AAHIVP, CPP Infectious Disease Pharmacist 01/24/2023 9:10 PM

## 2023-01-24 NOTE — ED Notes (Signed)
PT came to ED saturated in urine. RN and NT cleaned pt. Peri care given. New chuck and diaper applied at this time.

## 2023-01-24 NOTE — Progress Notes (Signed)
   01/24/23 2221  BiPAP/CPAP/SIPAP  $ Non-Invasive Home Ventilator  Initial  BiPAP/CPAP/SIPAP Pt Type Adult  BiPAP/CPAP/SIPAP BIPAP AUTO (Home Trilogy)  Mask Type Full face mask (Hybrid)  EPAP 5 cmH2O (Max 5, Min 5)  Pressure Support 7 cmH20 (Min 7, Max 20)  FiO2 (%) 21 %  Minute Ventilation 12.2  Leak 58.1  Peak Inspiratory Pressure (PIP) 12.1  Tidal Volume (Vt) 539  Patient Home Equipment Yes  Auto Titrate Yes (AVAPS-AE)  CPAP/SIPAP surface wiped down Yes  BiPAP/CPAP /SiPAP Vitals  Pulse Rate 85  Resp (!) 26  SpO2 95 %   Found Pt on Home Trilogy unit on  AVAPS-AE (VT 500, PS min/max 7/20, EPAP max/min 5/5) no O2 bleed. Pt in no distress @ this time. RN @ bedside. RT to follow.

## 2023-01-24 NOTE — H&P (Signed)
History and Physical    Patient: [name] [name] [id number] DOB: [birth year] DOA: 01/24/2023 DOS: the patient was seen and examined on 01/24/2023 PCP: Colon Branch, MD  Patient coming from: Home  Chief Complaint:  Chief Complaint  Patient presents with   Shortness of Breath   HPI: [name] [name] is a 72 y.o. male with medical history significant of hypertension, hyperlipidemia, type 2 diabetes mellitus, hypothyroidism, ALS who presents to the emergency department due to increased shortness of breath this morning.  Patient was nonverbal at baseline, he uses Trilogy for about 16 hours daily.  History was obtained from wife at bedside and from ED physician and ED medical record.  Per report, patient woke up this morning having increased work of breathing around 9 AM.  Cough assist was tried by wife without improvement, patient was in his normal state of health when he went to bed last night.  EMS was activated and patient was sent to the ED for further evaluation and management.  ED Course:  In the emergency department, patient was tachycardic, BP was 138/93, temperature 97.7 F, respiratory rate 19/min O2 sat 98% on BiPAP at FiO2 of 30%.  Workup in the ED showed normal CBC except for WBC of 12.0.  BMP showed hyponatremia with sodium of 1.4, potassium 4.3, chloride 97, bicarb 25, blood glucose 23, BUN 24, creatinine 0.78.  Troponin 1054 > 941, BNP 520.9, magnesium 1.8. Chest x-ray showed lower lung volumes with new patchy left greater than right airspace opacities which may reflect atelectasis or early infiltrates. Possible small left pleural effusion. Patient was treated with aspirin, ceftriaxone and azithromycin was given, IV fentanyl 50 mcg x 1 was given IV hydration 250 mL NS was also given and patient was started on heparin drip.  Cardiology was consulted, patient and wife do not want any invasive procedure and medical management recommendation was given by cardiologist.  Review of Systems: Review  of systems as noted in the HPI. All other systems reviewed and are negative.   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   Colon polyps    Colon polyps at age 84, next Cscope age 73   Diabetes mellitus    DJD (degenerative joint disease)    Glaucoma suspect    Hyperlipidemia    Past Surgical History:  Procedure Laterality Date   BUNIONECTOMY Right 12/27/2014   @[facility]   BUNIONECTOMY Left    2019   ESOPHAGEAL MANOMETRY N/A 07/23/2021   Procedure: ESOPHAGEAL MANOMETRY (EM);  Surgeon: Wilford Corner, MD;  Location: [facility];  Service: Endoscopy;  Laterality: N/A;   NASAL SEPTUM SURGERY  2000   PEG TUBE PLACEMENT     SHOULDER ARTHROSCOPY     L 2011, R 2012    Social History:  reports that he has quit smoking. He has never used smokeless tobacco. He reports current alcohol use of about 2.0 standard drinks of alcohol per week. He reports that he does not use drugs.   No Known Allergies  Family History  Problem Relation Age of Onset   Arthritis Mother    Alzheimer's disease Mother    Lung cancer Father    Stroke Other    Diabetes Other        uncle, brother (borderline)   GER disease Sister    Colon cancer Neg Hx    Prostate cancer Neg Hx    CAD Neg Hx      Prior to Admission medications  Medication Sig Start Date End Date Taking? Authorizing Provider  Accu-Chek Softclix Lancets lancets Use as instructed 02/17/21   Colon Branch, MD  aspirin 81 MG tablet Place 81 mg into feeding tube daily.    [provider]  atorvastatin (LIPITOR) 40 MG tablet Place 1 tablet (40 mg total) into feeding tube daily. 12/08/22   Colon Branch, MD  azelastine (ASTELIN) 0.1 % nasal spray Place 2 sprays into both nostrils at bedtime. 01/02/21   Colon Branch, MD  Blood Glucose Monitoring Suppl (ACCU-CHEK AVIVA PLUS) w/Device KIT Use to check blood sugar 12/02/15   Colon Branch, MD  fluticasone [facility]) 50 MCG/ACT nasal spray Place into both nostrils  daily.    [provider]  glucose blood (ACCU-CHEK AVIVA) test strip Check blood sugar no more than twice daily 02/17/21   Colon Branch, MD  levothyroxine (SYNTHROID) 88 MCG tablet Place 1 tablet (88 mcg total) into feeding tube daily before breakfast. 12/08/22   Colon Branch, MD  lisinopril (ZESTRIL) 2.5 MG tablet Place 1 tablet (2.5 mg total) into feeding tube daily. 12/08/22   Colon Branch, MD  Melatonin 1 MG TABS Place 1 mg into feeding tube daily.    [provider]  Metformin HCl 500 MG/5ML SOLN Place 5 mLs (500 mg total) into feeding tube in the morning and at bedtime. 12/18/22   Colon Branch, MD  metoprolol tartrate (LOPRESSOR) 25 MG tablet Place 1 tablet (25 mg total) into feeding tube 2 (two) times daily. 12/08/22   Colon Branch, MD  Multiple Vitamin (MULTIVITAMIN) tablet Place 1 tablet into feeding tube daily. Patient not taking: Reported on 12/08/2022    [provider]  mupirocin cream (BACTROBAN) 2 % Apply 1 application topically as needed. Reported on 01/27/2016    [provider]  riluzole (RILUTEK) 50 MG tablet Place 50 mg into feeding tube every 12 (twelve) hours. 02/02/22   [provider]  sildenafil (REVATIO) 20 MG tablet TAKE 2 TO 3 TABLETS BY MOUTH ONCE DAILY AS NEEDED Patient taking differently: Place 40-60 mg into feeding tube daily as needed. 07/04/19   Colon Branch, MD  triamcinolone cream (KENALOG) 0.1 %  12/17/14   [provider]    Physical Exam: BP 139/85 (BP Location: Left Arm)   Pulse 91   Temp 97.6 F (36.4 C) (Oral)   Resp 18   Ht 6\' 4"  (1.93 m)   Wt 104.3 kg   SpO2 97%   BMI 28.00 kg/m   General: 72 y.o. year-old male well developed well nourished in no acute distress.  Alert and oriented x3. HEENT: NCAT, EOMI Neck: Supple, trachea medial Cardiovascular: Tachycardia.  Regular rate and rhythm with no rubs or gallops.  No thyromegaly or JVD noted.  No lower extremity edema. 2/4 pulses in all 4  extremities. Respiratory: Clear to auscultation with no wheezes or rales. Good inspiratory effort. Abdomen: PEG tube in place.  Soft, nontender nondistended with normal bowel sounds x4 quadrants. Muskuloskeletal: No cyanosis, clubbing or edema noted bilaterally Neuro: CN II-XII intact, strength 5/5 x 4, sensation, reflexes intact Skin: No ulcerative lesions noted or rashes Psychiatry: Mood is appropriate for condition and setting          Labs on Admission:  Basic Metabolic Panel: Recent Labs  Lab 01/24/23 1140 01/24/23 1148  NA 125* 125*  K 4.3 4.3  CL 87* 89*  CO2 25  --   GLUCOSE 183* 176*  BUN 24* 29*  CREATININE 0.78 0.70  CALCIUM 9.3  --   MG 1.8  --    Liver Function Tests: Recent Labs  Lab 01/24/23 1140  AST 49*  ALT 55*  ALKPHOS 104  BILITOT 1.2  PROT 6.8  ALBUMIN 3.7   No results for input(s): "LIPASE", "AMYLASE" in the last 168 hours. No results for input(s): "AMMONIA" in the last 168 hours. CBC: Recent Labs  Lab 01/24/23 1140 01/24/23 1148  WBC 12.0*  --   NEUTROABS 9.8*  --   HGB 14.6 15.6  HCT 41.4 46.0  MCV 93.5  --   PLT 212  --    Cardiac Enzymes: No results for input(s): "CKTOTAL", "CKMB", "CKMBINDEX", "TROPONINI" in the last 168 hours.  BNP (last 3 results) Recent Labs    01/24/23 1140  BNP 520.9*    ProBNP (last 3 results) No results for input(s): "PROBNP" in the last 8760 hours.  CBG: Recent Labs  Lab 01/24/23 1140  GLUCAP 207*    Radiological Exams on Admission: DG Chest Portable 1 View  Result Date: 01/24/2023 CLINICAL DATA:  Shortness of breath.  History of ALS and diabetes. EXAM: PORTABLE CHEST 1 VIEW COMPARISON:  No prior radiographs available.  Chest CT 01/14/2022. FINDINGS: 1153 hours. There are lower lung volumes. The heart size and mediastinal contours are stable. New patchy left greater than right airspace opacities may reflect atelectasis or early infiltrates. Possible small left pleural effusion. No evidence of  pneumothorax or acute osseous abnormality. Telemetry leads overlie the chest. IMPRESSION: Lower lung volumes with new patchy left greater than right airspace opacities which may reflect atelectasis or early infiltrates. Possible small left pleural effusion. Electronically Signed   By: Richardean Sale M.D.   On: 01/24/2023 12:07    EKG: I independently viewed the EKG done and my findings are as followed: Sinus tachycardia at a rate of 112 bpm with incomplete RBBB.  ST elevation in lateral leads.  Assessment/Plan Present on Admission:  STEMI (ST elevation myocardial infarction) ([place])  CAP (community acquired pneumonia)  Coronary artery disease involving native coronary artery of native heart without angina pectoris  ALS (amyotrophic lateral sclerosis) (HCC)  Essential hypertension  Mixed hyperlipidemia  Principal Problem:   STEMI (ST elevation myocardial infarction) ([place]) Active Problems:   Mixed hyperlipidemia   Essential hypertension   Coronary artery disease involving native coronary artery of native heart without angina pectoris   ALS (amyotrophic lateral sclerosis) (HCC)   CAP (community acquired pneumonia)   Acute on chronic respiratory failure with hypoxia (HCC)   STEMI CAD per CT coronary HS Troponin 1054 > 941 BNP 520.9 EKG personally reviewed showed sinus tachycardia at a rate of 112 bpm with incomplete RBBB with ST elevation in lateral leads Patient and wife (at bedside) do not want any heroic measures, but okay with medical management. Continue heparin drip Continue aspirin, metoprolol, Plavix, Lipitor per cardiology recommendation TSH, A1c, lipid panel will be checked, echocardiogram will be done Cardiology consult appreciated  Acute on chronic respiratory failure with hypoxia secondary to multifactorial including CAP POA and possibly STEMI Chest x-ray was suggestive of atelectasis or early infiltrates Patient was started on ceftriaxone and azithromycin, we shall  continue same at this time with plan to de-escalate/discontinue based on blood culture, sputum culture, urine Legionella, strep pneumo and procalcitonin Continue Tylenol as needed Continue Mucinex, incentive spirometry, flutter valve   Essential hypertension Continue metoprolol  Acquired hypothyroidism Continue Synthroid  Mixed hyperlipidemia Continue Lipitor  Type 2  diabetes mellitus with uncontrolled hyperglycemia Hemoglobin A1c on 12/08/22 was 8.0 Continue ISS and hypoglycemia protocol Metformin will be held at this time  ALS Continue riluzole Continue BiPAP/Trilogy from home  PEG tube dependent Dietitian will be consulted for enteral tube feeding   DVT prophylaxis: Heparin drip  Code Status: DNR  Consults: Cardiology, dietitian  Family Communication: Wife at bedside (all questions answered to satisfaction)  Severity of Illness: The appropriate patient status for this patient is INPATIENT. Inpatient status is judged to be reasonable and necessary in order to provide the required intensity of service to ensure the patient's safety. The patient's presenting symptoms, physical exam findings, and initial radiographic and laboratory data in the context of their chronic comorbidities is felt to place them at high risk for further clinical deterioration. Furthermore, it is not anticipated that the patient will be medically stable for discharge from the hospital within 2 midnights of admission.   * I certify that at the point of admission it is my clinical judgment that the patient will require inpatient hospital care spanning beyond 2 midnights from the point of admission due to high intensity of service, high risk for further deterioration and high frequency of surveillance required.*  Author: Bernadette Hoit, DO 01/24/2023 6:06 PM  For on call review [email or web address].

## 2023-01-24 NOTE — Plan of Care (Signed)

## 2023-01-24 NOTE — ED Triage Notes (Signed)
Pt brought in by [facility] from home. CC: SOB, says it began 1.5-2 hrs prior.  Hx: ALS, DM [facility] VS: BP: 160/98. 100% 02 Non rebreather 20g IV L wrist

## 2023-01-24 NOTE — ED Notes (Signed)
ED TO INPATIENT HANDOFF REPORT  ED Nurse Name and Phone #: Tori [phone]  S Name/Age/Gender [name] [name] 72 y.o. male Room/Bed: TRACC/TRACC  Code Status   Code Status: Not on file  Home/SNF/Other Home Patient oriented to: self, place, time, and situation Is this baseline? Yes   Triage Complete: Triage complete  Chief Complaint STEMI (ST elevation myocardial infarction) [I21.3]  Triage Note Pt brought in by [facility] from home. CC: SOB, says it began 1.5-2 hrs prior.  Hx: ALS, DM [facility] VS: BP: 160/98. 100% 02 Non rebreather 20g IV L wrist      Allergies No Known Allergies  Level of Care/Admitting Diagnosis ED Disposition     ED Disposition  Admit   Condition  --   Comment  Hospital Area: [facility] [[id number]]  Level of Care: Telemetry Medical [104]  May admit patient to [facility] or [facility] if equivalent level of care is available:: Yes  Covid Evaluation: Asymptomatic - no recent exposure (last 10 days) testing not required  Diagnosis: STEMI (ST elevation myocardial infarction) [id number]  Admitting Physician: Bernadette Hoit [id number]  Attending Physician: Bernadette Hoit [id number]  Certification:: I certify this patient will need inpatient services for at least 2 midnights  Estimated Length of Stay: 3          B Medical/Surgery History 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   Colon polyps    Colon polyps at age 67, next Cscope age 37   Diabetes mellitus    DJD (degenerative joint disease)    Glaucoma suspect    Hyperlipidemia    Past Surgical History:  Procedure Laterality Date   BUNIONECTOMY Right 12/27/2014   @[facility]   BUNIONECTOMY Left    2019   ESOPHAGEAL MANOMETRY N/A 07/23/2021   Procedure: ESOPHAGEAL MANOMETRY (EM);  Surgeon: Wilford Corner, MD;  Location: [facility];  Service: Endoscopy;  Laterality: N/A;   NASAL SEPTUM SURGERY  2000   PEG TUBE PLACEMENT     SHOULDER  ARTHROSCOPY     L 2011, R 2012     A IV Location/Drains/Wounds Patient Lines/Drains/Airways Status     Active Line/Drains/Airways     Name Placement date Placement time Site Days   Peripheral IV 01/24/23 20 G Right Antecubital 01/24/23  1127  Antecubital  less than 1   External Urinary Catheter 01/24/23  1154  --  less than 1            Intake/Output Last 24 hours No intake or output data in the 24 hours ending 01/24/23 1433  Labs/Imaging Results for orders placed or performed during the hospital encounter of 01/24/23 (from the past 48 hour(s))  CBG monitoring, ED     Status: Abnormal   Collection Time: 01/24/23 11:40 AM  Result Value Ref Range   Glucose-Capillary 207 (H) 70 - 99 mg/dL    Comment: Glucose reference range applies only to samples taken after fasting for at least 8 hours.  Basic metabolic panel     Status: Abnormal   Collection Time: 01/24/23 11:40 AM  Result Value Ref Range   Sodium 125 (L) 135 - 145 mmol/L   Potassium 4.3 3.5 - 5.1 mmol/L   Chloride 87 (L) 98 - 111 mmol/L   CO2 25 22 - 32 mmol/L   Glucose, Bld 183 (H) 70 - 99 mg/dL    Comment: Glucose reference range applies only to samples taken after fasting for at least 8 hours.  BUN 24 (H) 8 - 23 mg/dL   Creatinine, Ser 0.78 0.61 - 1.24 mg/dL   Calcium 9.3 8.9 - 10.3 mg/dL   GFR, Estimated >60 >60 mL/min    Comment: (NOTE) Calculated using the CKD-EPI Creatinine Equation (2021)    Anion gap 13 5 - 15    Comment: Performed at [facility] [address], [place], [place] [address]  Troponin I (High Sensitivity)     Status: Abnormal   Collection Time: 01/24/23 11:40 AM  Result Value Ref Range   Troponin I (High Sensitivity) 1,054 (HH) <18 ng/L    Comment: CRITICAL RESULT CALLED TO, READ BACK BY AND VERIFIED WITH V,VALDEZ RN @1231  01/24/23 E,BENTON (NOTE) Elevated high sensitivity troponin I (hsTnI) values and significant  changes across serial measurements may suggest ACS but many other   chronic and acute conditions are known to elevate hsTnI results.  Refer to the "Links" section for chest pain algorithms and additional  guidance. Performed at [facility], [place] [address] [place], [place] [address]   Magnesium     Status: None   Collection Time: 01/24/23 11:40 AM  Result Value Ref Range   Magnesium 1.8 1.7 - 2.4 mg/dL    Comment: Performed at [facility] [address] [place], [place] [address]  Brain natriuretic peptide (order if patient c/o SOB ONLY)     Status: Abnormal   Collection Time: 01/24/23 11:40 AM  Result Value Ref Range   B Natriuretic Peptide 520.9 (H) 0.0 - 100.0 pg/mL    Comment: Performed at [facility] [address], [place], [place] [address]  CBC with Differential/Platelet     Status: Abnormal   Collection Time: 01/24/23 11:40 AM  Result Value Ref Range   WBC 12.0 (H) 4.0 - 10.5 K/uL   RBC 4.43 4.22 - 5.81 MIL/uL   Hemoglobin 14.6 13.0 - 17.0 g/dL   HCT 41.4 39.0 - 52.0 %   MCV 93.5 80.0 - 100.0 fL   MCH 33.0 26.0 - 34.0 pg   MCHC 35.3 30.0 - 36.0 g/dL   RDW 12.3 11.5 - 15.5 %   Platelets 212 150 - 400 K/uL   nRBC 0.0 0.0 - 0.2 %   Neutrophils Relative % 82 %   Neutro Abs 9.8 (H) 1.7 - 7.7 K/uL   Lymphocytes Relative 13 %   Lymphs Abs 1.6 0.7 - 4.0 K/uL   Monocytes Relative 4 %   Monocytes Absolute 0.5 0.1 - 1.0 K/uL   Eosinophils Relative 1 %   Eosinophils Absolute 0.1 0.0 - 0.5 K/uL   Basophils Relative 0 %   Basophils Absolute 0.0 0.0 - 0.1 K/uL   nRBC 0 0 /100 WBC   Abs Immature Granulocytes 0.00 0.00 - 0.07 K/uL    Comment: Performed at [facility], [address], [place], [place] [address]  Hepatic function panel     Status: Abnormal   Collection Time: 01/24/23 11:40 AM  Result Value Ref Range   Total Protein 6.8 6.5 - 8.1 g/dL   Albumin 3.7 3.5 - 5.0 g/dL   AST 49 (H) 15 - 41 U/L   ALT 55 (H) 0 - 44 U/L   Alkaline Phosphatase 104 38 - 126 U/L   Total Bilirubin 1.2 0.3 - 1.2 mg/dL    Bilirubin, Direct 0.3 (H) 0.0 - 0.2 mg/dL   Indirect Bilirubin 0.9 0.3 - 0.9 mg/dL    Comment: Performed at [facility], [place] [address], [place], [place] [address]  I-stat chem 8, ED     Status: Abnormal   Collection Time: 01/24/23 11:48 AM  Result Value Ref Range   Sodium 125 (L) 135 - 145 mmol/L   Potassium 4.3 3.5 - 5.1 mmol/L   Chloride 89 (L) 98 - 111 mmol/L   BUN 29 (H) 8 - 23 mg/dL   Creatinine, Ser 0.70 0.61 - 1.24 mg/dL   Glucose, Bld 176 (H) 70 - 99 mg/dL    Comment: Glucose reference range applies only to samples taken after fasting for at least 8 hours.   Calcium, Ion 1.10 (L) 1.15 - 1.40 mmol/L   TCO2 28 22 - 32 mmol/L   Hemoglobin 15.6 13.0 - 17.0 g/dL   HCT 46.0 39.0 - 52.0 %   DG Chest Portable 1 View  Result Date: 01/24/2023 CLINICAL DATA:  Shortness of breath.  History of ALS and diabetes. EXAM: PORTABLE CHEST 1 VIEW COMPARISON:  No prior radiographs available.  Chest CT 01/14/2022. FINDINGS: 1153 hours. There are lower lung volumes. The heart size and mediastinal contours are stable. New patchy left greater than right airspace opacities may reflect atelectasis or early infiltrates. Possible small left pleural effusion. No evidence of pneumothorax or acute osseous abnormality. Telemetry leads overlie the chest. IMPRESSION: Lower lung volumes with new patchy left greater than right airspace opacities which may reflect atelectasis or early infiltrates. Possible small left pleural effusion. Electronically Signed   By: Richardean Sale M.D.   On: 01/24/2023 12:07    Pending Labs Unresulted Labs (From admission, onward)     Start     Ordered   01/25/23 0500  Heparin level (unfractionated)  Daily,   R      01/24/23 1338   01/24/23 2000  Heparin level (unfractionated)  Once-Timed,   URGENT        01/24/23 1338            Vitals/Pain Today's Vitals   01/24/23 1315 01/24/23 1330 01/24/23 1337 01/24/23 1352  BP: 119/87 111/83    Pulse: (!) 104 (!) 101    Resp:  19 19    Temp:      TempSrc:      SpO2: 97% 97%    Weight:   104.3 kg   Height:   6\' 4"  (1.93 m)   PainSc:    3     Isolation Precautions No active isolations  Medications Medications  glycopyrrolate (ROBINUL) tablet 2 mg (has no administration in time range)  fentaNYL (SUBLIMAZE) injection 25 mcg (has no administration in time range)  heparin ADULT infusion 100 units/mL (25000 units/250mL) (1,300 Units/hr Intravenous New Bag/Given 01/24/23 1352)  cefTRIAXone (ROCEPHIN) 1 g in sodium chloride 0.9 % 100 mL IVPB (has no administration in time range)  azithromycin (ZITHROMAX) 500 mg in sodium chloride 0.9 % 250 mL IVPB (has no administration in time range)  sodium chloride 0.9 % bolus 250 mL (has no administration in time range)  aspirin chewable tablet 324 mg (324 mg Per Tube Given 01/24/23 1317)  fentaNYL (SUBLIMAZE) injection 50 mcg (50 mcg Intravenous Given 01/24/23 1325)  heparin bolus via infusion 4,000 Units (4,000 Units Intravenous Bolus from Bag 01/24/23 1352)    Mobility non-ambulatory     Focused Assessments Pulmonary Assessment Handoff:  Lung sounds:   O2 Device: Bi-PAP      R Recommendations: See Admitting Provider Note  Report given to:   Additional Notes:  Pt is axox4 and  is on bipap. Pts wife is at bedside and helps pt communicate on phone. Pt has elevated trop and is on heparin gtt. VSS.

## 2023-01-24 NOTE — ED Provider Notes (Signed)
[facility] Provider Note   CSN: [id number] Arrival date & time: 01/24/23  1108     History  Chief Complaint  Patient presents with   Shortness of Breath    [name] [name] is a 72 y.o. male.   Shortness of Breath Associated symptoms: chest pain   Patient presents for difficulty breathing.  Medical history includes T2DM, HLD, COPD, CAD, GERD, ALS. At baseline, he uses NIV 16 hours/day.  This morning, at approximately 9 AM, patient's wife noticed that he was having difficulty breathing.  She tried cough assist without improvement.  She states that, prior to that, he was in his normal state of health.  On arrival, patient endorses difficulty breathing.  He points to the area of his upper chest.  History is limited by patient's nonverbal status.  He is able to type on his phone.     Home Medications Prior to Admission medications   Medication Sig Start Date End Date Taking? Authorizing Provider  Accu-Chek Softclix Lancets lancets Use as instructed 02/17/21   Colon Branch, MD  aspirin 81 MG tablet Place 81 mg into feeding tube daily.    [provider]  atorvastatin (LIPITOR) 40 MG tablet Place 1 tablet (40 mg total) into feeding tube daily. 12/08/22   Colon Branch, MD  azelastine (ASTELIN) 0.1 % nasal spray Place 2 sprays into both nostrils at bedtime. 01/02/21   Colon Branch, MD  Blood Glucose Monitoring Suppl (ACCU-CHEK AVIVA PLUS) w/Device KIT Use to check blood sugar 12/02/15   Colon Branch, MD  fluticasone [facility]) 50 MCG/ACT nasal spray Place into both nostrils daily.    [provider]  glucose blood (ACCU-CHEK AVIVA) test strip Check blood sugar no more than twice daily 02/17/21   Colon Branch, MD  levothyroxine (SYNTHROID) 88 MCG tablet Place 1 tablet (88 mcg total) into feeding tube daily before breakfast. 12/08/22   Colon Branch, MD  lisinopril (ZESTRIL) 2.5 MG tablet Place 1 tablet (2.5 mg total) into feeding tube daily.  12/08/22   Colon Branch, MD  Melatonin 1 MG TABS Place 1 mg into feeding tube daily.    [provider]  Metformin HCl 500 MG/5ML SOLN Place 5 mLs (500 mg total) into feeding tube in the morning and at bedtime. 12/18/22   Colon Branch, MD  metoprolol tartrate (LOPRESSOR) 25 MG tablet Place 1 tablet (25 mg total) into feeding tube 2 (two) times daily. 12/08/22   Colon Branch, MD  Multiple Vitamin (MULTIVITAMIN) tablet Place 1 tablet into feeding tube daily. Patient not taking: Reported on 12/08/2022    [provider]  mupirocin cream (BACTROBAN) 2 % Apply 1 application topically as needed. Reported on 01/27/2016    [provider]  riluzole (RILUTEK) 50 MG tablet Place 50 mg into feeding tube every 12 (twelve) hours. 02/02/22   [provider]  sildenafil (REVATIO) 20 MG tablet TAKE 2 TO 3 TABLETS BY MOUTH ONCE DAILY AS NEEDED Patient taking differently: Place 40-60 mg into feeding tube daily as needed. 07/04/19   Colon Branch, MD  triamcinolone cream (KENALOG) 0.1 %  12/17/14   [provider]      Allergies    Patient has no known allergies.    Review of Systems   Review of Systems  Unable to perform ROS: Patient nonverbal  Respiratory:  Positive for shortness of breath.   Cardiovascular:  Positive for chest  pain.    Physical Exam Updated Vital Signs BP 111/83   Pulse (!) 101   Temp 97.7 F (36.5 C) (Tympanic)   Resp 19   Ht 6\' 4"  (1.93 m)   Wt 104.3 kg   SpO2 97%   BMI 28.00 kg/m  Physical Exam Vitals and nursing note reviewed.  Constitutional:      General: He is not in acute distress.    Appearance: He is well-developed. He is not toxic-appearing or diaphoretic.  HENT:     Head: Normocephalic and atraumatic.     Mouth/Throat:     Mouth: Mucous membranes are moist.  Eyes:     Conjunctiva/sclera: Conjunctivae normal.  Cardiovascular:     Rate and Rhythm: Regular rhythm. Tachycardia present.     Heart sounds: No murmur  heard. Pulmonary:     Effort: Accessory muscle usage present. No tachypnea.     Breath sounds: Normal breath sounds. No decreased breath sounds, wheezing, rhonchi or rales.  Chest:     Chest wall: No tenderness.  Abdominal:     Palpations: Abdomen is soft.     Tenderness: There is no abdominal tenderness.     Comments: PEG tube in place  Musculoskeletal:        General: No swelling.     Cervical back: Normal range of motion and neck supple.     Right lower leg: No edema.     Left lower leg: No edema.  Skin:    General: Skin is warm and dry.     Capillary Refill: Capillary refill takes less than 2 seconds.  Neurological:     Mental Status: He is alert. Mental status is at baseline.  Psychiatric:        Mood and Affect: Mood normal.        Behavior: Behavior normal.     ED Results / Procedures / Treatments   Labs (all labs ordered are listed, but only abnormal results are displayed) Labs Reviewed  BASIC METABOLIC PANEL - Abnormal; Notable for the following components:      Result Value   Sodium 125 (*)    Chloride 87 (*)    Glucose, Bld 183 (*)    BUN 24 (*)    All other components within normal limits  BRAIN NATRIURETIC PEPTIDE - Abnormal; Notable for the following components:   B Natriuretic Peptide 520.9 (*)    All other components within normal limits  CBC WITH DIFFERENTIAL/PLATELET - Abnormal; Notable for the following components:   WBC 12.0 (*)    Neutro Abs 9.8 (*)    All other components within normal limits  HEPATIC FUNCTION PANEL - Abnormal; Notable for the following components:   AST 49 (*)    ALT 55 (*)    Bilirubin, Direct 0.3 (*)    All other components within normal limits  CBG MONITORING, ED - Abnormal; Notable for the following components:   Glucose-Capillary 207 (*)    All other components within normal limits  I-STAT CHEM 8, ED - Abnormal; Notable for the following components:   Sodium 125 (*)    Chloride 89 (*)    BUN 29 (*)    Glucose, Bld  176 (*)    Calcium, Ion 1.10 (*)    All other components within normal limits  TROPONIN I (HIGH SENSITIVITY) - Abnormal; Notable for the following components:   Troponin I (High Sensitivity) 1,054 (*)    All other components within normal limits  MAGNESIUM  HEPARIN  LEVEL (UNFRACTIONATED)  TROPONIN I (HIGH SENSITIVITY)    EKG None  Radiology DG Chest Portable 1 View  Result Date: 01/24/2023 CLINICAL DATA:  Shortness of breath.  History of ALS and diabetes. EXAM: PORTABLE CHEST 1 VIEW COMPARISON:  No prior radiographs available.  Chest CT 01/14/2022. FINDINGS: 1153 hours. There are lower lung volumes. The heart size and mediastinal contours are stable. New patchy left greater than right airspace opacities may reflect atelectasis or early infiltrates. Possible small left pleural effusion. No evidence of pneumothorax or acute osseous abnormality. Telemetry leads overlie the chest. IMPRESSION: Lower lung volumes with new patchy left greater than right airspace opacities which may reflect atelectasis or early infiltrates. Possible small left pleural effusion. Electronically Signed   By: Richardean Sale M.D.   On: 01/24/2023 12:07    Procedures Procedures    Medications Ordered in ED Medications  glycopyrrolate (ROBINUL) tablet 2 mg (has no administration in time range)  fentaNYL (SUBLIMAZE) injection 25 mcg (has no administration in time range)  heparin ADULT infusion 100 units/mL (25000 units/247mL) (1,300 Units/hr Intravenous New Bag/Given 01/24/23 1352)  cefTRIAXone (ROCEPHIN) 1 g in sodium chloride 0.9 % 100 mL IVPB (has no administration in time range)  azithromycin (ZITHROMAX) 500 mg in sodium chloride 0.9 % 250 mL IVPB (has no administration in time range)  sodium chloride 0.9 % bolus 250 mL (has no administration in time range)  aspirin chewable tablet 324 mg (324 mg Per Tube Given 01/24/23 1317)  fentaNYL (SUBLIMAZE) injection 50 mcg (50 mcg Intravenous Given 01/24/23 1325)  heparin  bolus via infusion 4,000 Units (4,000 Units Intravenous Bolus from Bag 01/24/23 1352)    ED Course/ Medical Decision Making/ A&P                             Medical Decision Making Amount and/or Complexity of Data Reviewed Labs: ordered. Radiology: ordered.  Risk OTC drugs. Prescription drug management.   This patient presents to the ED for concern of difficulty breathing, this involves an extensive number of treatment options, and is a complaint that carries with it a high risk of complications and morbidity.  The differential diagnosis includes progression of ALS, mucous plugging, CHF, pneumonia, ACS, COPD   Co morbidities that complicate the patient evaluation  T2DM, HLD, COPD, CAD, GERD, ALS   Additional history obtained:  Additional history obtained from patient's wife External records from outside source obtained and reviewed including EMR   Lab Tests:  I Ordered, and personally interpreted labs.  The pertinent results include: Leukocytosis is present.  Troponin and BNP are elevated.  Hyponatremia is present.   Imaging Studies ordered:  I ordered imaging studies including chest x-ray I independently visualized and interpreted imaging which showed patchy, left greater than right airspace opacities I agree with the radiologist interpretation   Cardiac Monitoring: / EKG:  The patient was maintained on a cardiac monitor.  I personally viewed and interpreted the cardiac monitored which showed an underlying rhythm of: Sinus rhythm   Consultations Obtained:  I requested consultation with the STEMI doctor on-call, Dr. Ellyn Hack,  and discussed lab and imaging findings as well as pertinent plan - they recommend: Medical management following shared decision-making discussion with patient and wife I requested consultation with the cardiologist, Dr. Dellia Cloud,  and discussed lab and imaging findings as well as pertinent plan - they recommend: Will evaluate in the ED and  provide recommendations and follow in consult  Problem List / ED Course / Critical interventions / Medication management  Patient presents for difficulty breathing.  He has had progression of his ALS over the past several years.  He now requires noninvasive ventilation at home throughout most of the day and night.  This morning, at around 9 AM, his wife noticed that he is having increased difficulty breathing.  Patient arrives in the ED via EMS.  He is nonverbal at baseline and history is initially limited.  Patient types on his phone to communicate.  He typed and cannot breathe.  He does state that he would like BiPAP.  BiPAP was initiated.  RT worked with him on different masks and different settings to facilitate help with his breathing.  He was able to find a place of comfort with his breathing.  Home glycopyrrolate was ordered.  He appears to have minimal secretions at this time.  Patient's wife subsequently arrived at bedside.  She was able to provide further history.  EKG is limited by artifact, however, there is concern of lateral ST segment elevations.  Initial troponin is elevated in the range of 1000.  ASA and heparin were ordered.  I spoke with STEMI doctor on-call, Dr. Ellyn Hack, who did review EKG and does agree with concerning ST segment elevations.  A shared decision-making discussion was had all over speaker phone with Dr. Ellyn Hack, wife and patient.  Patient's wife is not agreeable at this time to invasive procedures.  She does agree with medical management.  Cardiology consult was ordered.  Fentanyl was ordered for analgesia.  On x-ray, there are some new patchy infiltrates.  There is a mild leukocytosis.  Patient to be treated empirically for pneumonia.  Antibiotics were ordered.  Patient was admitted to medicine for further management. I ordered medication including ASA and heparin for ACS Mi: Fentanyl for analgesia; ceftriaxone and azithromycin for empiric treatment of pneumonia; IV fluids  for hydration Reevaluation of the patient after these medicines showed that the patient improved I have reviewed the patients home medicines and have made adjustments as needed   Social Determinants of Health:  Has advanced ALS.  Nonverbal at baseline.  Lives at home with wife  CRITICAL CARE Performed by: Godfrey Pick   Total critical care time: 35 minutes  Critical care time was exclusive of separately billable procedures and treating other patients.  Critical care was necessary to treat or prevent imminent or life-threatening deterioration.  Critical care was time spent personally by me on the following activities: development of treatment plan with patient and/or surrogate as well as nursing, discussions with consultants, evaluation of patient's response to treatment, examination of patient, obtaining history from patient or surrogate, ordering and performing treatments and interventions, ordering and review of laboratory studies, ordering and review of radiographic studies, pulse oximetry and re-evaluation of patient's condition.        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

## 2023-01-24 NOTE — ED Notes (Signed)
MD notified about troponin.

## 2023-01-24 NOTE — Consult Note (Addendum)
Cardiology Consultation   Patient ID: [name] [name] MRN: [id number]; DOB: [birth year]  Admit date: 01/24/2023 Date of Consult: 01/24/2023  PCP:  Colon Branch, [place] [facility] Cardiologist:  Buford Dresser, MD   {    Patient Profile:   [name] [name] is a 72 y.o. male with a hx of HTN, HLD, type 2 DM, DJD,PVCs, CAD per coronary CT, ALS, who is being seen 01/24/2023 for the evaluation of STEMI at the request of Dr Josephine Cables.  History of Present Illness:   [name] [name] with above past medical history presented to the ER today complaining shortness of breath.  Symptoms started this morning around 9 AM.  Patient is dependent on NIV 16 hours a day.  Wife had tried cough assistant but did not improve his symptoms.  He is not able to communicate due to nonverbal status. 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 He denied having any chest pain. He nodded that he has breathing difficulties. Wife states his lungs are bad and states she is surprised he had heart attack. Wife states they do not want any invasive workup. Wife states he has no bleeding.   Admission diagnostic today revealed hyponatremia 125, hypochloremia 87, glucose 183, creatinine 0.78, BUN 24, AST 49, ALT 55.  BNP elevated 520.  High sensitive troponin 1054 > 941.  CBC with leukocytosis 12,000.Chest x-ray revealed low lung volume was new patchy left greater than right airspace opacities which may reflect atelectasis or early infiltrate, possible small left pleural effusion. EKG revealed sinus tachycardia 105 bpm, STE of lateral leads and lead II, anterior Q waves.  EKG was reviewed with intervention Dr. Ellyn Hack, consistent with STEMI.  ER provider Dr. Doren Custard had discussed with patient's wife and the patient, the patient' wife does not wish to pursue any invasive procedures and is agreeable with medical management.  Cardiology is consulted for further management of STEMI.    Per chart review, he follows Dr. Harrell Gave  outpatient, last seen 06/02/2021, has CAD diagnosed by coronary CT calcification, never had angina, has been maintained on aspirin and atorvastatin.  He takes lisinopril for hypertension and proteinuria.  He is on metoprolol for PVCs, asymptomatic at baseline.  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  He follows [facility] and was last seen by [facility] on 01/13/2023 for second opinion regarding his ALS.  He is PEG tube dependent for intake, and is dependent on NIV as well as cough assist during the daytime. He is on Riluzole 50mg  BID.      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   Colon polyps    Colon polyps at age 29, next Cscope age 26   Diabetes mellitus    DJD (degenerative joint disease)    Glaucoma suspect    Hyperlipidemia     Past Surgical History:  Procedure Laterality Date   BUNIONECTOMY Right 12/27/2014   @[facility]   BUNIONECTOMY Left    2019   ESOPHAGEAL MANOMETRY N/A 07/23/2021   Procedure: ESOPHAGEAL MANOMETRY (EM);  Surgeon: Wilford Corner, MD;  Location: [facility];  Service: Endoscopy;  Laterality: N/A;   NASAL SEPTUM SURGERY  2000   PEG TUBE PLACEMENT     SHOULDER ARTHROSCOPY     L 2011, R 2012     Home  Medications:  Prior to Admission medications   Medication Sig Start Date End Date Taking? Authorizing Provider  Accu-Chek Softclix Lancets lancets Use as instructed 02/17/21   Colon Branch, MD  aspirin 81 MG tablet Place 81 mg into feeding tube daily.    [provider]  atorvastatin (LIPITOR) 40 MG tablet Place 1 tablet (40 mg total) into feeding tube daily. 12/08/22   Colon Branch, MD  azelastine (ASTELIN) 0.1 % nasal spray Place 2 sprays into both nostrils at bedtime. 01/02/21   Colon Branch, MD  Blood Glucose  Monitoring Suppl (ACCU-CHEK AVIVA PLUS) w/Device KIT Use to check blood sugar 12/02/15   Colon Branch, MD  fluticasone [facility]) 50 MCG/ACT nasal spray Place into both nostrils daily.    [provider]  glucose blood (ACCU-CHEK AVIVA) test strip Check blood sugar no more than twice daily 02/17/21   Colon Branch, MD  levothyroxine (SYNTHROID) 88 MCG tablet Place 1 tablet (88 mcg total) into feeding tube daily before breakfast. 12/08/22   Colon Branch, MD  lisinopril (ZESTRIL) 2.5 MG tablet Place 1 tablet (2.5 mg total) into feeding tube daily. 12/08/22   Colon Branch, MD  Melatonin 1 MG TABS Place 1 mg into feeding tube daily.    [provider]  Metformin HCl 500 MG/5ML SOLN Place 5 mLs (500 mg total) into feeding tube in the morning and at bedtime. 12/18/22   Colon Branch, MD  metoprolol tartrate (LOPRESSOR) 25 MG tablet Place 1 tablet (25 mg total) into feeding tube 2 (two) times daily. 12/08/22   Colon Branch, MD  Multiple Vitamin (MULTIVITAMIN) tablet Place 1 tablet into feeding tube daily. Patient not taking: Reported on 12/08/2022    [provider]  mupirocin cream (BACTROBAN) 2 % Apply 1 application topically as needed. Reported on 01/27/2016    [provider]  riluzole (RILUTEK) 50 MG tablet Place 50 mg into feeding tube every 12 (twelve) hours. 02/02/22   [provider]  sildenafil (REVATIO) 20 MG tablet TAKE 2 TO 3 TABLETS BY MOUTH ONCE DAILY AS NEEDED Patient taking differently: Place 40-60 mg into feeding tube daily as needed. 07/04/19   Colon Branch, MD  triamcinolone cream (KENALOG) 0.1 %  12/17/14   [provider]    Inpatient Medications: Scheduled Meds:  [START ON 01/25/2023] aspirin  81 mg Oral Daily   atorvastatin  80 mg Per Tube Daily   glycopyrrolate  2 mg Per Tube TID   metoprolol tartrate  25 mg Per Tube BID   Continuous Infusions:  azithromycin     heparin 1,300 Units/hr (01/24/23 1352)   PRN Meds: fentaNYL (SUBLIMAZE)  injection  Allergies:   No Known Allergies  Social History:   Social History   Socioeconomic History   Marital status: Married    Spouse name: Not on file   Number of children: 2   Years of education: Not on file   Highest education level: Not on file  Occupational History   Occupation: disability    Employer: [employer]  Tobacco Use   Smoking status: Former   Smokeless tobacco: Never   Tobacco comments:    quit on-off, no smoking since 12/2018  Substance and Sexual Activity   Alcohol use: Yes    Alcohol/week: 2.0 standard drinks of alcohol    Types: 2 Cans of beer per week    Comment: socially    Drug use: No   Sexual activity: Yes  Other Topics Concern   Not on file  Social History Narrative   Household-- pt and wife        Social Determinants of Health   Financial Resource Strain: Low Risk  (02/19/2022)   Overall Financial Resource Strain (CARDIA)    Difficulty of Paying Living Expenses: Not hard at all  Food Insecurity: No Food Insecurity (02/19/2022)   Hunger Vital Sign    Worried About Running Out of Food in the Last Year: Never true    Ran Out of Food in the Last Year: Never true  Transportation Needs: No Transportation Needs (02/19/2022)   PRAPARE - Hydrologist (Medical): No    Lack of Transportation (Non-Medical): No  Physical Activity: Insufficiently Active (02/13/2021)   Exercise Vital Sign    Days of Exercise per Week: 3 days    Minutes of Exercise per Session: 30 min  Stress: No Stress Concern Present (02/19/2022)   [place]    Feeling of Stress : Not at all  Social Connections: Moderately Isolated (02/19/2022)   Social Connection and Isolation Panel [NHANES]    Frequency of Communication with Friends and Family: More than three times a week    Frequency of Social Gatherings with Friends and Family: Three times a week    Attends Religious Services: Never     Active Member of Clubs or Organizations: No    Attends Archivist Meetings: Never    Marital Status: Married  Human resources officer Violence: Not At Risk (02/19/2022)   Humiliation, Afraid, Rape, and Kick questionnaire    Fear of Current or Ex-Partner: No    Emotionally Abused: No    Physically Abused: No    Sexually Abused: No    Family History:    Family History  Problem Relation Age of Onset   Arthritis Mother    Alzheimer's disease Mother    Lung cancer Father    Stroke Other    Diabetes Other        uncle, brother (borderline)   GER disease Sister    Colon cancer Neg Hx    Prostate cancer Neg Hx    CAD Neg Hx      ROS:  Constitutional: Denied fever, chills, malaise, night sweats Eyes: Denied vision change or loss Ears/Nose/Mouth/Throat: Denied ear ache, sore throat, coughing, sinus pain Cardiovascular: see HPI  Respiratory: see HPI  Gastrointestinal: Denied nausea, vomiting, abdominal pain, diarrhea Genital/Urinary: Denied dysuria, hematuria, urinary frequency/urgency Musculoskeletal: ALS Skin: Denied rash, wound Neuro: Denied headache, dizziness, syncope Psych: Denied history of depression/anxiety  Endocrine: history of diabetes   Physical Exam/Data:   Vitals:   01/24/23 1330 01/24/23 1337 01/24/23 1345 01/24/23 1400  BP: 111/83  107/77 102/72  Pulse: (!) 101  90 100  Resp: 19  12 15   Temp:      TempSrc:      SpO2: 97%  97% 97%  Weight:  104.3 kg    Height:  6\' 4"  (1.93 m)     No intake or output data in the 24 hours ending 01/24/23 1559    01/24/2023    1:37 PM 07/07/2022    9:44 AM 05/21/2022   11:28 AM  Last 3 Weights  Weight (lbs) 230 lb 231 lb 8 oz 230 lb 8 oz  Weight (kg) 104.327 kg 105.008 kg 104.554 kg     Body mass index is 28 kg/m.   Vitals:  Vitals:  01/24/23 1345 01/24/23 1400  BP: 107/77 102/72  Pulse: 90 100  Resp: 12 15  Temp:    SpO2: 97% 97%   General Appearance: In no apparent distress, laying in bed HEENT:  Normocephalic, atraumatic.  Neck: Supple, trachea midline, no JVDs Cardiovascular: Regular rate and rhythm, normal S1-S2,  no murmur Respiratory: Resting breathing unlabored, lungs sounds clear to auscultation bilaterally, no use of accessory muscles. On 2LNC.  Gastrointestinal: Bowel sounds positive, abdomen soft   Extremities: Spontaneous movement noted of arms and legs  Genitourinary: Foley in place  Musculoskeletal: Overall decondition and weakness  Skin: Intact, warm, dry. No rashes  Neurologic: Alert, oriented to person, non-verbal, communicating thru writing and nodding and body language  Psychiatric: Normal affect. Mood is appropriate.        EKG:  The EKG was personally reviewed and demonstrates:   EKG from 01/24/23 at 1154: Sinus tachycardia 105 bpm, low voltage QRS, anterior Q waves, ST elevation noted of lateral leads and lead II  EKG from 01/24/2023 1315: Sinus tachycardia 102 bpm, slightly improved ST elevation of lateral leads  Telemetry:  Telemetry was personally reviewed and demonstrates:    Not on telemetry at this time, transferred from ER to [place]  Relevant CV Studies:   ETT 03/20/21:   Blood pressure demonstrated a hypertensive response to exercise. There was no ST segment deviation noted during stress.   ETT with poor exercise tolerance (1:37); no chest pain; hypertensive blood pressure response; no ST changes; frequent PVCs noted; negative inadequate exercise treadmill (patient achieved 80% target heart rate; ischemia at higher workloads cannot be excluded).  Laboratory Data:  High Sensitivity Troponin:   Recent Labs  Lab 01/24/23 1140 01/24/23 1329  TROPONINIHS 1,054* 941*     Chemistry Recent Labs  Lab 01/24/23 1140 01/24/23 1148  NA 125* 125*  K 4.3 4.3  CL 87* 89*  CO2 25  --   GLUCOSE 183* 176*  BUN 24* 29*  CREATININE 0.78 0.70  CALCIUM 9.3  --   MG 1.8  --   GFRNONAA >60  --   ANIONGAP 13  --     Recent Labs  Lab 01/24/23 1140   PROT 6.8  ALBUMIN 3.7  AST 49*  ALT 55*  ALKPHOS 104  BILITOT 1.2   Lipids No results for input(s): "CHOL", "TRIG", "HDL", "LABVLDL", "LDLCALC", "CHOLHDL" in the last 168 hours.  Hematology Recent Labs  Lab 01/24/23 1140 01/24/23 1148  WBC 12.0*  --   RBC 4.43  --   HGB 14.6 15.6  HCT 41.4 46.0  MCV 93.5  --   MCH 33.0  --   MCHC 35.3  --   RDW 12.3  --   PLT 212  --    Thyroid No results for input(s): "TSH", "FREET4" in the last 168 hours.  BNP Recent Labs  Lab 01/24/23 1140  BNP 520.9*    DDimer No results for input(s): "DDIMER" in the last 168 hours.   Radiology/Studies:  DG Chest Portable 1 View  Result Date: 01/24/2023 CLINICAL DATA:  Shortness of breath.  History of ALS and diabetes. EXAM: PORTABLE CHEST 1 VIEW COMPARISON:  No prior radiographs available.  Chest CT 01/14/2022. FINDINGS: 1153 hours. There are lower lung volumes. The heart size and mediastinal contours are stable. New patchy left greater than right airspace opacities may reflect atelectasis or early infiltrates. Possible small left pleural effusion. No evidence of pneumothorax or acute osseous abnormality. Telemetry leads overlie the chest. IMPRESSION: Lower lung  volumes with new patchy left greater than right airspace opacities which may reflect atelectasis or early infiltrates. Possible small left pleural effusion. Electronically Signed   By: Richardean Sale M.D.   On: 01/24/2023 12:07     Assessment and Plan:   Lateral STEMI CAD per CT coronary  - presented with SOB - Hs trop 1054 >941 - BNP 520 - EKG with lateral STEMI - wife and patient do not wish invasive procedure, OK with medical management  - recommend admit to progressive, telemetry monitor - start 48 hours heparin gtt, continue PTA aspirin 81mg , metoprolol 25mg  BID, HOLD  lisinopril 2.5mg  daily (BP low), add lipitor 80mg  daily  - will check TSH,A1C,lipid panel, and Echo   ALS HTN HLD Type 2 DM - per primary team    Risk  Assessment/Risk Scores:     TIMI Risk Score for ST  Elevation MI:   The patient's TIMI risk score is 5, which indicates a 12.4% risk of all cause mortality at 30 days.{   For questions or updates, please contact [place] Please consult [email or web address] for contact info under    Signed, Margie Billet, NP  01/24/2023 3:59 PM   Attending attestation  Patient seen and independently examined with Margie Billet, NP. We discussed all aspects of the encounter. I agree with the assessment and plan as stated above.  Patient is a 72 year old M known to have HTN, DM 2, HLD, ALS is currently admitted to the hospitalist team for the management of acute STEMI on medical management. Patient and wife refused any invasive procedures. Interventional cardiology recommended cardiology consult. Patient had acute onset of SOB and chest pain prior to coming to the hospital. Chest pain-free during my interview. Physical examination remarkable for, not in acute respite distress, HEENT normal, G-tube in place, lungs clear, S1-S2 heard, nontender and nondistended abdomen, no pitting edema to lower extremities. Labs remarkable for high sensitivity troponins 900,1000s. Echo pending. Recommend to continue ACS protocol for total duration of 48-72 hours.   Dorsie Burich Fidel Levy, MD [place]  5:14 PM

## 2023-01-24 NOTE — ED Notes (Signed)
X-ray at bedside

## 2023-01-24 NOTE — Progress Notes (Signed)
ANTICOAGULATION CONSULT NOTE - Initial Consult  Pharmacy Consult for Heparin Indication: chest pain/ACS  No Known Allergies  Patient Measurements: Height: 6\' 4"  (193 cm) Weight: 104.3 kg (230 lb) IBW/kg (Calculated) : 86.8 Heparin Dosing Weight: 104 kg  Vital Signs: Temp: 97.7 F (36.5 C) (03/31 1118) Temp Source: Tympanic (03/31 1118) BP: 111/83 (03/31 1330) Pulse Rate: 101 (03/31 1330)  Labs: Recent Labs    01/24/23 1140 01/24/23 1148  HGB 14.6 15.6  HCT 41.4 46.0  PLT 212  --   CREATININE 0.78 0.70  TROPONINIHS 1,054*  --     Estimated Creatinine Clearance: 114 mL/min (by C-G formula based on SCr of 0.7 mg/dL).   Medical History: 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   Colon polyps    Colon polyps at age 46, next Cscope age 62   Diabetes mellitus    DJD (degenerative joint disease)    Glaucoma suspect    Hyperlipidemia     Medications:  (Not in a hospital admission)  Scheduled:   glycopyrrolate  2 mg Per Tube TID   heparin  4,000 Units Intravenous Once   Infusions:   heparin     PRN: fentaNYL (SUBLIMAZE) injection  Assessment: 72 yom with a history of T2DM, HLD, COPD, CAD, GERD, ALS. Patient is presenting with SOB. Heparin per pharmacy consult placed for chest pain/ACS.  Patient is not on anticoagulation prior to arrival.  Hgb 15.6; plt 212 hsTrop 1054  Goal of Therapy:  Heparin level 0.3-0.7 units/ml Monitor platelets by anticoagulation protocol: Yes   Plan:  Give IV heparin 4000 units bolus x 1 Start heparin infusion at 1300 units/hr Check anti-Xa level at 2000 Daily heparin level while on heparin Continue to monitor H&H and platelets  Lorelei Pont, PharmD, BCPS 01/24/2023 1:40 PM ED Clinical Pharmacist -  [phone]

## 2023-01-25 ENCOUNTER — Inpatient Hospital Stay (HOSPITAL_COMMUNITY): Payer: Medicare Other

## 2023-01-25 ENCOUNTER — Other Ambulatory Visit (HOSPITAL_COMMUNITY): Payer: Self-pay

## 2023-01-25 DIAGNOSIS — E871 Hypo-osmolality and hyponatremia: Secondary | ICD-10-CM | POA: Insufficient documentation

## 2023-01-25 DIAGNOSIS — J159 Unspecified bacterial pneumonia: Secondary | ICD-10-CM | POA: Insufficient documentation

## 2023-01-25 DIAGNOSIS — I213 ST elevation (STEMI) myocardial infarction of unspecified site: Secondary | ICD-10-CM | POA: Diagnosis not present

## 2023-01-25 DIAGNOSIS — I2510 Atherosclerotic heart disease of native coronary artery without angina pectoris: Secondary | ICD-10-CM

## 2023-01-25 DIAGNOSIS — R1030 Lower abdominal pain, unspecified: Secondary | ICD-10-CM

## 2023-01-25 DIAGNOSIS — E440 Moderate protein-calorie malnutrition: Secondary | ICD-10-CM | POA: Insufficient documentation

## 2023-01-25 DIAGNOSIS — I10 Essential (primary) hypertension: Secondary | ICD-10-CM | POA: Diagnosis not present

## 2023-01-25 DIAGNOSIS — J449 Chronic obstructive pulmonary disease, unspecified: Secondary | ICD-10-CM

## 2023-01-25 DIAGNOSIS — R34 Anuria and oliguria: Secondary | ICD-10-CM

## 2023-01-25 DIAGNOSIS — E782 Mixed hyperlipidemia: Secondary | ICD-10-CM | POA: Diagnosis not present

## 2023-01-25 DIAGNOSIS — I2109 ST elevation (STEMI) myocardial infarction involving other coronary artery of anterior wall: Secondary | ICD-10-CM

## 2023-01-25 DIAGNOSIS — G1221 Amyotrophic lateral sclerosis: Secondary | ICD-10-CM | POA: Diagnosis not present

## 2023-01-25 DIAGNOSIS — M159 Polyosteoarthritis, unspecified: Secondary | ICD-10-CM

## 2023-01-25 LAB — URINALYSIS, ROUTINE W REFLEX MICROSCOPIC
Bilirubin Urine: NEGATIVE
Glucose, UA: NEGATIVE mg/dL
Ketones, ur: NEGATIVE mg/dL
Nitrite: NEGATIVE
Protein, ur: 30 mg/dL — AB
Specific Gravity, Urine: 1.011 (ref 1.005–1.030)
WBC, UA: >50 WBC/hpf (ref 0–5)
pH: 7 (ref 5.0–8.0)

## 2023-01-25 LAB — GLUCOSE, CAPILLARY
Glucose-Capillary: 156 mg/dL — ABNORMAL HIGH (ref 70–99)
Glucose-Capillary: 182 mg/dL — ABNORMAL HIGH (ref 70–99)
Glucose-Capillary: 207 mg/dL — ABNORMAL HIGH (ref 70–99)
Glucose-Capillary: 229 mg/dL — ABNORMAL HIGH (ref 70–99)
Glucose-Capillary: 236 mg/dL — ABNORMAL HIGH (ref 70–99)
Glucose-Capillary: 245 mg/dL — ABNORMAL HIGH (ref 70–99)

## 2023-01-25 LAB — ECHOCARDIOGRAM COMPLETE
AR max vel: 2.73 cm2
AV Area VTI: 2.81 cm2
AV Area mean vel: 2.77 cm2
AV Mean grad: 4 mmHg
AV Peak grad: 7.5 mmHg
Ao pk vel: 1.37 m/s
Area-P 1/2: 2.98 cm2
Height: 76 in
Single Plane A4C EF: 34.2 %
Weight: 3680 oz

## 2023-01-25 LAB — LIPID PANEL
Cholesterol: 122 mg/dL (ref 0–200)
HDL: 52 mg/dL (ref >40)
LDL Cholesterol: 60 mg/dL (ref 0–99)
Total CHOL/HDL Ratio: 2.3 RATIO
Triglycerides: 48 mg/dL (ref <150)
VLDL: 10 mg/dL (ref 0–40)

## 2023-01-25 LAB — OSMOLALITY, URINE: Osmolality, Ur: 475 mOsm/kg (ref 300–900)

## 2023-01-25 LAB — CBC
HCT: 36.8 % — ABNORMAL LOW (ref 39.0–52.0)
Hemoglobin: 12.9 g/dL — ABNORMAL LOW (ref 13.0–17.0)
MCH: 32.3 pg (ref 26.0–34.0)
MCHC: 35.1 g/dL (ref 30.0–36.0)
MCV: 92.2 fL (ref 80.0–100.0)
Platelets: 212 10*3/uL (ref 150–400)
RBC: 3.99 MIL/uL — ABNORMAL LOW (ref 4.22–5.81)
RDW: 12.2 % (ref 11.5–15.5)
WBC: 10.5 10*3/uL (ref 4.0–10.5)
nRBC: 0 % (ref 0.0–0.2)

## 2023-01-25 LAB — BASIC METABOLIC PANEL
Anion gap: 8 (ref 5–15)
BUN: 24 mg/dL — ABNORMAL HIGH (ref 8–23)
CO2: 24 mmol/L (ref 22–32)
Calcium: 8.5 mg/dL — ABNORMAL LOW (ref 8.9–10.3)
Chloride: 85 mmol/L — ABNORMAL LOW (ref 98–111)
Creatinine, Ser: 0.76 mg/dL (ref 0.61–1.24)
GFR, Estimated: >60 mL/min (ref >60)
Glucose, Bld: 199 mg/dL — ABNORMAL HIGH (ref 70–99)
Potassium: 3.9 mmol/L (ref 3.5–5.1)
Sodium: 122 mmol/L — ABNORMAL LOW (ref 135–145)

## 2023-01-25 LAB — MAGNESIUM: Magnesium: 1.8 mg/dL (ref 1.7–2.4)

## 2023-01-25 LAB — PHOSPHORUS: Phosphorus: 3.7 mg/dL (ref 2.5–4.6)

## 2023-01-25 LAB — TSH: TSH: 11.025 u[IU]/mL — ABNORMAL HIGH (ref 0.350–4.500)

## 2023-01-25 LAB — SODIUM, URINE, RANDOM: Sodium, Ur: 76 mmol/L

## 2023-01-25 LAB — HEPARIN LEVEL (UNFRACTIONATED): Heparin Unfractionated: 0.54 IU/mL (ref 0.30–0.70)

## 2023-01-25 LAB — PROCALCITONIN: Procalcitonin: 0.15 ng/mL

## 2023-01-25 LAB — T4, FREE: Free T4: 1.2 ng/dL — ABNORMAL HIGH (ref 0.61–1.12)

## 2023-01-25 MED ORDER — CHLORHEXIDINE GLUCONATE CLOTH 2 % EX PADS
6.0000 | MEDICATED_PAD | Freq: Every day | CUTANEOUS | Status: DC
  Administered 2023-01-25 – 2023-01-29 (×5): 6 via TOPICAL

## 2023-01-25 MED ORDER — JEVITY 1.5 CAL/FIBER PO LIQD
474.0000 mL | ORAL | Status: DC
  Administered 2023-01-25 – 2023-01-29 (×12): 474 mL
  Filled 2023-01-25 (×14): qty 474

## 2023-01-25 MED ORDER — FREE WATER
250.0000 mL | Status: DC
  Administered 2023-01-25 – 2023-01-29 (×16): 250 mL

## 2023-01-25 MED ORDER — RILUZOLE 50 MG PO TABS
50.0000 mg | ORAL_TABLET | Freq: Two times a day (BID) | ORAL | Status: DC
  Administered 2023-01-25 – 2023-01-29 (×8): 50 mg via ORAL
  Filled 2023-01-25 (×8): qty 1

## 2023-01-25 MED ORDER — DOXAZOSIN MESYLATE 1 MG PO TABS
1.0000 mg | ORAL_TABLET | Freq: Every day | ORAL | Status: DC
  Administered 2023-01-25 – 2023-01-29 (×5): 1 mg
  Filled 2023-01-25 (×5): qty 1

## 2023-01-25 MED ORDER — SENNOSIDES 8.8 MG/5ML PO SYRP
5.0000 mL | ORAL_SOLUTION | Freq: Two times a day (BID) | ORAL | Status: DC | PRN
  Administered 2023-01-26: 5 mL via ORAL
  Filled 2023-01-25 (×3): qty 5

## 2023-01-25 MED ORDER — PERFLUTREN LIPID MICROSPHERE
1.0000 mL | INTRAVENOUS | Status: AC | PRN
  Administered 2023-01-25: 4 mL via INTRAVENOUS

## 2023-01-25 MED ORDER — SODIUM CHLORIDE 0.9 % IV BOLUS
500.0000 mL | Freq: Once | INTRAVENOUS | Status: AC
  Administered 2023-01-25: 500 mL via INTRAVENOUS

## 2023-01-25 MED ORDER — ENOXAPARIN SODIUM 100 MG/ML IJ SOSY
100.0000 mg | PREFILLED_SYRINGE | Freq: Two times a day (BID) | INTRAMUSCULAR | Status: DC
  Administered 2023-01-25 – 2023-01-26 (×4): 100 mg via SUBCUTANEOUS
  Filled 2023-01-25 (×4): qty 1

## 2023-01-25 MED ORDER — RILUZOLE 50 MG PO TABS
50.0000 mg | ORAL_TABLET | Freq: Two times a day (BID) | ORAL | Status: DC
  Administered 2023-01-25: 50 mg via ORAL

## 2023-01-25 MED ORDER — SODIUM CHLORIDE 0.9 % IV SOLN: INTRAVENOUS | Status: DC

## 2023-01-25 MED ORDER — JEVITY 1.5 CAL/FIBER PO LIQD
237.0000 mL | ORAL | Status: DC
  Administered 2023-01-25 – 2023-01-28 (×4): 237 mL
  Filled 2023-01-25 (×5): qty 237

## 2023-01-25 MED ORDER — RILUZOLE 50 MG PO TABS
50.0000 mg | ORAL_TABLET | Freq: Two times a day (BID) | ORAL | Status: DC
  Filled 2023-01-25: qty 1

## 2023-01-25 MED ORDER — METOPROLOL TARTRATE 5 MG/5ML IV SOLN: 2.5000 mg | Freq: Four times a day (QID) | INTRAVENOUS | Status: DC | PRN

## 2023-01-25 MED ORDER — RILUZOLE 50 MG PO TABS: 50.0000 mg | ORAL_TABLET | Freq: Two times a day (BID) | ORAL | Status: DC

## 2023-01-25 NOTE — Progress Notes (Addendum)
Initial Nutrition Assessment  DOCUMENTATION CODES:   Non-severe (moderate) malnutrition in context of chronic illness  INTERVENTION:  Resume home tube feed regimen via G-tube: -2 cartons of Jevity 1.5 (474 mL) at 10AM  -2 cartons of Jevity 1.5 (474 mL)  at 2:30PM -1 carton of Jevity 1.5 (237 mL)  at 6:30PM -2 cartons of Jevity 1.5 (474 mL) at 10:30PM -Total daily provision is 7 cartons daily, which provides 2485 kcal, 106 grams of protein 1260 mL H2O daily  Per discussion with MD, plan is to resume flushing tube with Gatorade Zero for a majority of flushes as per home regimen to assist with sodium management: -Flush tube with 100 mL before feeds and 150 mL after feeds. Per family 200 mL of these flushes are Gatorade Zero and 50 mL of these flushes are plain water. -Provides a total of 2260 mL fluid daily including water in tube feed regimen.  NUTRITION DIAGNOSIS:   Moderate Malnutrition related to chronic illness (ALS, dysphagia now s/p G-tube placement) as evidenced by mild fat depletion, moderate fat depletion, mild muscle depletion, moderate muscle depletion.  GOAL:   Patient will meet greater than or equal to 90% of their needs  MONITOR:   Labs, Weight trends, TF tolerance, I & O's  REASON FOR ASSESSMENT:   Consult Enteral/tube feeding initiation and management  ASSESSMENT:   72 year old male who is nonverbal at baseline with PMHx of HTN, HLD, DM type 2, hypothyroidism, ALS, G-tube dependence admitted with STEMI, acute on chronic respiratory failure with hypoxia, CAP.  Per review of chart decision was made to treat STEMI medically.  Met with pt and family at bedside. Pt is nonverbal at baseline but able to communicate by nodding yes/no and also typing on notes app in phone. Family also assisted a little with providing history. Pt reports he is NPO at baseline. He is on BiPAP approximately 16 hours per day. His G-tube was placed in July of 2023. He receives bolus regimen  with Jevity 1.5 throughout the day. He receives 2 cartons at 10AM, 2 cartons at 2:30PM, 1 carton on 6:30PM, and 2 cartons at 10:30PM. Water flushes are 100 mL before each feed and 150 mL after each feed. 200 mL of those flushes are Gatorade (family uses powdered Gatorade zero and mixes up ahead of time) and then 50 mL are plain water to help with sodium management. Home regimen provides 2485 kcal, 106 grams of protein, 2260 mL fluid daily (1260 mL from tube feeds + 1000 mL from flushes). Family reports they were told to not bring in Gatorade powder yet for water flushes per G-tube on admission but they are able to bring in once needed. DME is Hospital doctor. Pt reports he had nausea yesterday but none today. Reporting some abdominal pain, but family feels may be related to inability to urinate.  Pt reports he had a history of weight loss prior to G-tube placement. UBW was around 260 lbs (118.1 kg). He reports over the past 10 months since he has had G-tube his weight has been stable and he is no longer losing weight.   Enteral Access: G-tube present on admission  Enteral nutrition: started on Jevity 1.2 at 30 mL/hour on 01/25/23  Medications reviewed and include: free water 100 mL every 6 hours, glycopyrrolate, Novolog 0-15 units every 4 hours, levothyroxine, melatonin, NS at 75 mL/hr, ceftriaxone  Labs reviewed: CBG 182-209, Sodium 122, Chloride 85, BUN 24  UOP: 150 mL documented previous 24 hours  I/O: +243.9 mL since admission  Discussed with RN. Also discussed with MD via secure chat. Plan is to resume home regimen and resume Gatorade zero as flushes per G-tube.  NUTRITION - FOCUSED PHYSICAL EXAM:  Flowsheet Row Most Recent Value  Orbital Region Mild depletion  Upper Arm Region Moderate depletion  Thoracic and Lumbar Region No depletion  Buccal Region Unable to assess  [pt wearing BiPAP]  Temple Region Unable to assess  [pt wearing BiPAP]  Clavicle Bone Region Moderate  depletion  Clavicle and Acromion Bone Region Moderate depletion  Scapular Bone Region Mild depletion  Dorsal Hand Moderate depletion  Patellar Region Mild depletion  Anterior Thigh Region Moderate depletion  Posterior Calf Region Mild depletion  Edema (RD Assessment) None  Hair Reviewed  Eyes Reviewed  Mouth Unable to assess  Skin Reviewed  Nails Reviewed      Diet Order:   Diet Order     None      EDUCATION NEEDS:   No education needs have been identified at this time  Skin:  Skin Assessment: Reviewed RN Assessment  Last BM:  Unknown/PTA  Height:   Ht Readings from Last 1 Encounters:  01/24/23 6\' 4"  (1.93 m)   Weight:   Wt Readings from Last 1 Encounters:  01/24/23 104.3 kg   Ideal Body Weight:  91.8 kg  BMI:  Body mass index is 28 kg/m.  Estimated Nutritional Needs:   Kcal:  2300-2500  Protein:  105-115 grams  Fluid:  >/= 2 L/day  Loanne Drilling, MS, RD, LDN, CNSC Pager number available on Amion

## 2023-01-25 NOTE — Inpatient Diabetes Management (Addendum)
Inpatient Diabetes Program Recommendations  AACE/ADA: New Consensus Statement on Inpatient Glycemic Control (2015)  Target Ranges:  Prepandial:   less than 140 mg/dL      Peak postprandial:   less than 180 mg/dL (1-2 hours)      Critically ill patients:  140 - 180 mg/dL   Lab Results  Component Value Date   GLUCAP 182 (H) 01/25/2023   HGBA1C 8.0 (H) 12/08/2022    Review of Glycemic Control  Latest Reference Range & Units 01/24/23 11:40 01/24/23 21:16 01/25/23 00:01 01/25/23 04:15  Glucose-Capillary 70 - 99 mg/dL 207 (H) 209 (H) 207 (H) 182 (H)   Diabetes history: DM 2 Outpatient Diabetes medications: Metformin 500 mg bid Current orders for Inpatient glycemic control:  Novolog 0-15 units Q4 hours  A1c 8% on 2/13  Inpatient Diabetes Program Recommendations:    -    Add Novolog 3 units at scheduled Tube Feed times      1000, 2:30, 6:30, 10:30.   Thanks,  Tama Headings RN, MSN, BC-ADM Inpatient Diabetes Coordinator Team Pager [phone] (8a-5p)

## 2023-01-25 NOTE — Progress Notes (Signed)
PROGRESS NOTE  [name] [name] [id number] DOB: [birth year]   PCP: Colon Branch, MD  Patient is from: Home.   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  Troponin elevated to 1000.  EKG with ST elevation in lateral leads.  BNP 520.  CXR concerning for bilateral infiltrates. Na 125.  Cardiology consulted.  Patient was started on IV heparin, ceftriaxone and azithromycin.  Patient and family deferred invasive procedures.  TTE ordered.  Cardiology following.    Subjective: Seen and examined earlier this morning.  Son, daughter and other family members at bedside.  Patient feels "so-so".  Reports some chest pain and shortness of breath.  Family reports decreased urine output.  He did not have his tube feed yesterday.  Per family, patient does bolus feed and takes his ALS medication in the morning.  He also uses Gatorade and instead of free water at home.  Objective: Vitals:   01/24/23 2231 01/25/23 0410 01/25/23 0741 01/25/23 1127  BP: (!) 192/141 125/79 (!) 143/90 135/79  Pulse: 100 78 91 77  Resp:  18 (!) 21 (!) 22  Temp:  99.1 F (37.3 C) 98.2 F (36.8 C) 98.2 F (36.8 C)  TempSrc:  Axillary Oral Oral  SpO2:  94% 94% 94%  Weight:      Height:        Examination:  GENERAL: No apparent distress.  Nontoxic. HEENT: MMM.  Vision and hearing grossly intact.  NECK: Supple.  No apparent JVD.  RESP:  No IWOB.  Fair aeration bilaterally. CVS:  RRR. Heart sounds normal.  ABD/GI/GU: BS+. Abd soft, NTND.  G-tube in place. MSK/EXT:  Moves extremities.  Globally weak.  More pronounced in LLE. SKIN: no apparent skin lesion or wound NEURO: Awake, alert and oriented  appropriately.  LLL weakness (chronic). PSYCH: Calm. Normal affect.   Procedures:  None  Microbiology summarized: Blood cultures NGTD  Assessment and plan: Principal Problem:   STEMI (ST elevation myocardial infarction) Active Problems:   Mixed hyperlipidemia   DJD (degenerative joint disease)   Hypothyroidism   Chronic obstructive pulmonary disease   Essential hypertension   Coronary artery disease involving native coronary artery of native heart without angina pectoris   ALS (amyotrophic lateral sclerosis)   CAP (community acquired pneumonia)   Malnutrition of moderate degree   Bacterial pneumonia   Hyponatremia  STEMI/CAD: Patient presents with acute dyspnea.  Troponin 1054>> 941.  EKG with STE in lateral leads.  Hemodynamically stable.  Patient and family not interested in invasive intervention.  LDL 60. -Cardiology on board-on Lovenox, aspirin, Plavix, metoprolol and Lipitor  -Follow TTE  Possible bacterial pneumonia: Patient presents with acute dyspnea.  CXR concerning for bilateral infiltrates.  This could be pneumonitis as well.  Slightly elevated BNP but patient does not look hypervolemic.  He is at risk for aspiration given his ALS.  He is PEG tube dependent for feeding.  No fever.  Leukocytosis mild. -Continue ceftriaxone and Zithromax -Pulmonary toilet -Continue home glycopyrrolate  Elevated BNP: No signs of CHF.  Appears euvolemic on exam. -Follow echocardiogram  Essential hypertension -Continue metoprolol   Acquired hypothyroidism: TSH elevated. -Continue Synthroid -Check free T4   Mixed hyperlipidemia -Continue  Lipitor   Uncontrolled NIDDM-2 with hyperglycemia: A1c 8.0% in 11/2022.  On metformin at home. Recent Labs  Lab 01/24/23 1140 01/24/23 2116 01/25/23 0001 01/25/23 0415  GLUCAP 207* 209* 207* 182*  -Continue SSI-moderate every 4 hours -Add Semglee 10 units daily -Continue statin.   ALS: PEG and trilogy dependent. -Continue  riluzole -Continue BiPAP/Trilogy from home -Continue tube feed.  RD to switch to home bolus feed   PEG tube dependent -As above  Hyponatremia: Suspect hypovolemia.  He has minimal urine output.  Has not had tube feed yesterday -Check urine sodium and osmolality -IV NS at 75 cc an hour -Use home Gatorade instead of free water 40 feet.  Acute hypoxic respiratory failure ruled out.  No documented desaturation.   Moderate malnutrition Body mass index is 28 kg/m. Nutrition Problem: Moderate Malnutrition Etiology: chronic illness (ALS, dysphagia now s/p G-tube placement) Signs/Symptoms: mild fat depletion, moderate fat depletion, mild muscle depletion, moderate muscle depletion Interventions: Refer to RD note for recommendations   DVT prophylaxis:  SCDs Start: 01/24/23 1738 On full dose anticoagulation  Code Status: DNR/DNI Family Communication: Updated patient's son, daughter and other family members at bedside Level of care: Progressive Status is: Inpatient Remains inpatient appropriate because: STEMI, possible pneumonia and hyponatremia   Final disposition: TBD Consultants:  Cardiology  55 minutes with more than 50% spent in reviewing records, counseling patient/family and coordinating care.   Sch Meds:  Scheduled Meds:  aspirin  81 mg Per Tube Daily   atorvastatin  80 mg Per Tube Daily   clopidogrel  75 mg Per Tube Daily   enoxaparin (LOVENOX) injection  100 mg Subcutaneous BID   feeding supplement (JEVITY 1.5 CAL/FIBER)  237 mL Per Tube Q24H   feeding supplement (JEVITY 1.5 CAL/FIBER)  474 mL Per Tube 3 times per day   free water  250 mL Per Tube 4 times per day   glycopyrrolate  2 mg Per Tube TID   insulin aspart  0-15 Units Subcutaneous Q4H   levothyroxine  88 mcg Per Tube QAC breakfast   melatonin  1.5 mg Per Tube QHS   metoprolol tartrate  25 mg Per Tube BID   riluzole  50 mg Oral Q12H   Continuous Infusions:  sodium chloride 75 mL/hr at 01/25/23 1005    cefTRIAXone (ROCEPHIN)  IV 2 g (01/25/23 1007)   PRN Meds:.fentaNYL (SUBLIMAZE) injection, guaiFENesin-dextromethorphan, metoprolol tartrate, ondansetron **OR** ondansetron (ZOFRAN) IV, sennosides  Antimicrobials: Anti-infectives (From admission, onward)    Start     Dose/Rate Route Frequency Ordered Stop   01/25/23 1000  cefTRIAXone (ROCEPHIN) 2 g in sodium chloride 0.9 % 100 mL IVPB        2 g 200 mL/hr over 30 Minutes Intravenous Every 24 hours 01/24/23 1750     01/24/23 1400  cefTRIAXone (ROCEPHIN) 1 g in sodium chloride 0.9 % 100 mL IVPB        1 g 200 mL/hr over 30 Minutes Intravenous  Once 01/24/23 1354 01/24/23 1600   01/24/23 1400  azithromycin (ZITHROMAX) 500 mg in sodium chloride 0.9 % 250 mL IVPB        500 mg 250 mL/hr over 60 Minutes Intravenous  Once 01/24/23 1354 01/24/23 1815        I have personally reviewed the following labs and images: CBC: Recent Labs  Lab 01/24/23 1140 01/24/23 1148 01/25/23 0205  WBC 12.0*  --  10.5  NEUTROABS 9.8*  --   --   HGB 14.6 15.6 12.9*  HCT 41.4 46.0 36.8*  MCV 93.5  --  92.2  PLT 212  --  212   BMP &GFR Recent Labs  Lab 01/24/23 1140 01/24/23 1148 01/25/23 0205  NA 125* 125* 122*  K 4.3 4.3 3.9  CL 87* 89* 85*  CO2 25  --  24  GLUCOSE 183* 176* 199*  BUN 24* 29* 24*  CREATININE 0.78 0.70 0.76  CALCIUM 9.3  --  8.5*  MG 1.8  --  1.8  PHOS  --   --  3.7   Estimated Creatinine Clearance: 114 mL/min (by C-G formula based on SCr of 0.76 mg/dL). Liver & Pancreas: Recent Labs  Lab 01/24/23 1140  AST 49*  ALT 55*  ALKPHOS 104  BILITOT 1.2  PROT 6.8  ALBUMIN 3.7   No results for input(s): "LIPASE", "AMYLASE" in the last 168 hours. No results for input(s): "AMMONIA" in the last 168 hours. Diabetic: No results for input(s): "HGBA1C" in the last 72 hours. Recent Labs  Lab 01/24/23 1140 01/24/23 2116 01/25/23 0001 01/25/23 0415  GLUCAP 207* 209* 207* 182*   Cardiac Enzymes: No results for input(s):  "CKTOTAL", "CKMB", "CKMBINDEX", "TROPONINI" in the last 168 hours. No results for input(s): "PROBNP" in the last 8760 hours. Coagulation Profile: No results for input(s): "INR", "PROTIME" in the last 168 hours. Thyroid Function Tests: Recent Labs    01/24/23 1823 01/25/23 0205  TSH  --  11.025*  FREET4 1.20*  --    Lipid Profile: Recent Labs    01/24/23 1603 01/25/23 0205  CHOL 121 122  HDL 52 52  LDLCALC 63 60  TRIG 29 48  CHOLHDL 2.3 2.3   Anemia Panel: No results for input(s): "VITAMINB12", "FOLATE", "FERRITIN", "TIBC", "IRON", "RETICCTPCT" in the last 72 hours. Urine analysis:    Component Value Date/Time   COLORURINE YELLOW 07/28/2016 1226   APPEARANCEUR CLEAR 07/28/2016 1226   LABSPEC <=1.005 (A) 07/28/2016 1226   PHURINE 6.0 07/28/2016 1226   GLUCOSEU NEGATIVE 07/28/2016 1226   HGBUR NEGATIVE 07/28/2016 1226   BILIRUBINUR NEGATIVE 07/28/2016 1226   KETONESUR NEGATIVE 07/28/2016 1226   UROBILINOGEN 0.2 07/28/2016 1226   NITRITE NEGATIVE 07/28/2016 1226   LEUKOCYTESUR NEGATIVE 07/28/2016 1226   Sepsis Labs: Invalid input(s): "PROCALCITONIN", "LACTICIDVEN"  Microbiology: Recent Results (from the past 240 hour(s))  Culture, blood (Routine X 2) w Reflex to ID Panel     Status: None (Preliminary result)   Collection Time: 01/24/23  6:25 PM   Specimen: BLOOD RIGHT HAND  Result Value Ref Range Status   Specimen Description BLOOD RIGHT HAND  Final   Special Requests   Final    BOTTLES DRAWN AEROBIC AND ANAEROBIC Blood Culture adequate volume   Culture   Final    NO GROWTH < 12 HOURS Performed at [facility], [place] [address] [place], [place] [address]    Report Status PENDING  Incomplete  Culture, blood (Routine X 2) w Reflex to ID Panel     Status: None (Preliminary result)   Collection Time: 01/24/23  6:25 PM   Specimen: BLOOD RIGHT HAND  Result Value Ref Range Status   Specimen Description BLOOD RIGHT HAND  Final   Special Requests   Final     BOTTLES DRAWN AEROBIC AND ANAEROBIC Blood Culture adequate volume   Culture   Final    NO GROWTH < 12 HOURS Performed at [facility], [place] [address] [place], [place] [address]    Report Status PENDING  Incomplete    Radiology Studies: DG Chest Portable 1 View  Result Date: 01/24/2023 CLINICAL DATA:  Shortness of breath.  History of ALS and diabetes. EXAM: PORTABLE CHEST 1 VIEW COMPARISON:  No prior radiographs available.  Chest CT 01/14/2022. FINDINGS: 1153 hours. There are lower lung volumes. The heart size and mediastinal contours are stable. New patchy left greater than right airspace opacities may reflect atelectasis or early infiltrates. Possible small left pleural effusion. No evidence of pneumothorax or acute osseous abnormality. Telemetry leads overlie the chest. IMPRESSION: Lower lung volumes with new patchy left greater than right airspace opacities which may reflect atelectasis or early infiltrates. Possible small left pleural effusion. Electronically Signed   By: Richardean Sale M.D.   On: 01/24/2023 12:07      Ayanna Gheen T. [place]  If 7PM-7AM, please contact night-coverage [email or web address] 01/25/2023, 11:45 AM

## 2023-01-25 NOTE — Progress Notes (Addendum)
Rounding Note    Patient Name: [name] [name] Date of Encounter: 01/25/2023  [place] Cardiologist: Buford Dresser, MD   Subjective   No chest pain this morning. Nods yes/no to questions. Does complain of dysuria this morning. Not sleeping.  Inpatient Medications    Scheduled Meds:  aspirin  81 mg Per Tube Daily   atorvastatin  80 mg Per Tube Daily   clopidogrel  75 mg Per Tube Daily   free water  100 mL Per Tube Q6H   glycopyrrolate  2 mg Per Tube TID   insulin aspart  0-15 Units Subcutaneous Q4H   levothyroxine  88 mcg Per Tube QAC breakfast   melatonin  1.5 mg Per Tube QHS   metoprolol tartrate  25 mg Per Tube BID   riluzole  50 mg Per Tube Q12H   Continuous Infusions:  sodium chloride     cefTRIAXone (ROCEPHIN)  IV     feeding supplement (JEVITY 1.2 CAL) 30 mL/hr at 01/25/23 0544   heparin 1,300 Units/hr (01/25/23 0544)   PRN Meds: fentaNYL (SUBLIMAZE) injection, guaiFENesin-dextromethorphan, metoprolol tartrate, ondansetron **OR** ondansetron (ZOFRAN) IV   Vital Signs    Vitals:   01/24/23 2221 01/24/23 2231 01/25/23 0410 01/25/23 0741  BP:  (!) 192/141 125/79 (!) 143/90  Pulse: 85 100 78 91  Resp: (!) 26  18 (!) 21  Temp:   99.1 F (37.3 C) 98.2 F (36.8 C)  TempSrc:   Axillary Oral  SpO2: 95%  94% 94%  Weight:      Height:        Intake/Output Summary (Last 24 hours) at 01/25/2023 0909 Last data filed at 01/25/2023 0544 Gross per 24 hour  Intake 393.9 ml  Output 150 ml  Net 243.9 ml      01/24/2023    1:37 PM 07/07/2022    9:44 AM 05/21/2022   11:28 AM  Last 3 Weights  Weight (lbs) 230 lb 231 lb 8 oz 230 lb 8 oz  Weight (kg) 104.327 kg 105.008 kg 104.554 kg      Telemetry    Sinus Rhythm - Personally Reviewed  ECG    No new tracing this morning  Physical Exam   GEN: No acute distress, on NIV   Neck: No JVD Cardiac: RRR, no murmurs, rubs, or gallops.  Respiratory: Clear to auscultation bilaterally. GI: Soft,  nontender, non-distended  MS: No edema; No deformity. Neuro:  Nonfocal  Psych: Normal affect   Labs    High Sensitivity Troponin:   Recent Labs  Lab 01/24/23 1140 01/24/23 1329  TROPONINIHS 1,054* 941*     Chemistry Recent Labs  Lab 01/24/23 1140 01/24/23 1148 01/25/23 0205  NA 125* 125* 122*  K 4.3 4.3 3.9  CL 87* 89* 85*  CO2 25  --  24  GLUCOSE 183* 176* 199*  BUN 24* 29* 24*  CREATININE 0.78 0.70 0.76  CALCIUM 9.3  --  8.5*  MG 1.8  --  1.8  PROT 6.8  --   --   ALBUMIN 3.7  --   --   AST 49*  --   --   ALT 55*  --   --   ALKPHOS 104  --   --   BILITOT 1.2  --   --   GFRNONAA >60  --  >60  ANIONGAP 13  --  8    Lipids  Recent Labs  Lab 01/25/23 0205  CHOL 122  TRIG 48  HDL  [place] 2.3    Hematology Recent Labs  Lab 01/24/23 1140 01/24/23 1148 01/25/23 0205  WBC 12.0*  --  10.5  RBC 4.43  --  3.99*  HGB 14.6 15.6 12.9*  HCT 41.4 46.0 36.8*  MCV 93.5  --  92.2  MCH 33.0  --  32.3  MCHC 35.3  --  35.1  RDW 12.3  --  12.2  PLT 212  --  212   Thyroid  Recent Labs  Lab 01/25/23 0205  TSH 11.025*    BNP Recent Labs  Lab 01/24/23 1140  BNP 520.9*    DDimer No results for input(s): "DDIMER" in the last 168 hours.   Radiology    DG Chest Portable 1 View  Result Date: 01/24/2023 CLINICAL DATA:  Shortness of breath.  History of ALS and diabetes. EXAM: PORTABLE CHEST 1 VIEW COMPARISON:  No prior radiographs available.  Chest CT 01/14/2022. FINDINGS: 1153 hours. There are lower lung volumes. The heart size and mediastinal contours are stable. New patchy left greater than right airspace opacities may reflect atelectasis or early infiltrates. Possible small left pleural effusion. No evidence of pneumothorax or acute osseous abnormality. Telemetry leads overlie the chest. IMPRESSION: Lower lung volumes with new patchy left greater than right airspace opacities which may reflect atelectasis or early infiltrates. Possible small left  pleural effusion. Electronically Signed   By: Richardean Sale M.D.   On: 01/24/2023 12:07    Cardiac Studies   Echo: pending  Patient Profile     72 y.o. male with a hx of HTN, HLD, type 2 DM, DJD,PVCs, CAD per coronary CT, ALS, who is being seen 01/24/2023 for the evaluation of STEMI at the request of Dr Josephine Cables.   Assessment & Plan    STEMI Coronary calcifications on CT -- EKG on admission showed STE in lateral leads, per discussion with family on admission decision was made to treat medically.  No chest pain.  Initial plan was to treat with IV heparin for 48-72 hours, per discussion with family and patient frequent lab draws are preventing him from resting/freq interruptions  -- Will transition to therapeutic Lovenox -- Continue aspirin, Plavix, statin, metoprolol -- Echo pending --> With elevated troponin levels already trending down, question when his episode actually occurred.  BNP level was also slightly elevated.  We can assess the EF, but may need to consider diuretic.  Acute on Chronic respiratory failure CAP  ALS --On chronic NIV PTA, chest x-ray on admission suggestive of atelectasis versus early infiltrates -- On IV antibiotics per primary  HTN -- Continue metoprolol  HLD -- Continue atorvastatin 80 mg daily  DM -- On SSI   For questions or updates, please contact [place] Please consult [email or web address] for contact info under   Signed, Reino Bellis, NP  01/25/2023, 9:09 AM     ATTENDING ATTESTATION  I have seen, examined and evaluated the patient this morning on rounds along with Reino Bellis, NP.  After reviewing all the available data and chart, we discussed the patients laboratory, study & physical findings as well as symptoms in detail.  I agree with her findings, examination as well as impression recommendations as per our discussion.    Attending adjustments noted in italics.   Difficult situation with the patient with a terminal  illness such as ALS.  After the conversation with the patient's wife yesterday upon his initial presentation, she and her husband determined that they did not want to  proceed with any invasive procedures which is very reasonable.  We are therefore managing his STEMI medically with anticoagulation.  I agree with switching to Lovenox to avoid excess blood draws.  Would do aspirin and Plavix if able to tolerate guidelines would say for year, but if there is issues losing, would stop the aspirin first.  Does not seem to be having heart failure symptoms at this point, but echo still pending.  BNP was moderately elevated so there could be some component of CHF.  Low threshold to consider diuretic.  Standard treatment course for MI is probably 72 hours if there is no intervention.  He is 24 hours into his treatment.  Can reassess over the next day or 2 to see how well he is doing, but would not be a complete rush to discharge given the fact that he truly has a history of elevations.  Echo will help determine prognosis.    Leonie Man, MD, MS Glenetta Hew, M.D., M.S. Interventional Cardiologist  [place]  Pager # [phone] Phone # [phone] [address] [place], [place] [address]

## 2023-01-25 NOTE — Progress Notes (Signed)
Pt complaining of lower abdomen and groin pain.  150 ml urine at 3 am.  No urine since.  Bladder scan 10 ml.  Notified Dr. Cyndia Skeeters.

## 2023-01-25 NOTE — TOC Benefit Eligibility Note (Signed)
Patient Teacher, English as a foreign language completed.    The patient is currently admitted and upon discharge could be taking Farxiga 10 mg.  The current 30 day co-pay is $40.00.   The patient is currently admitted and upon discharge could be taking Jardiance 10 mg.  The current 30 day co-pay is $40.00.   The patient is insured through [employer] Part D   This test claim was processed through [facility] amounts may vary at other pharmacies due to pharmacy/plan contracts, or as the patient moves through the different stages of their insurance plan.  Lyndel Safe, [place] Patient Advocate Specialist [place] Patient Advocate Team Direct Number: [phone]  Fax: [phone]

## 2023-01-25 NOTE — Progress Notes (Addendum)
Subjective Notified by RN about patient complaining pain in the groin area and lower abdomen.  Also no urine output.  No significant urine on bladder scan.  Daughter, son and other family member in the room.  Patient reports pain in suprapubic region and groin area.  He rates his pain 9/10 although he does not appear to be in that much distress.  No urine output.  Family asking for ALS meds, stool softener... After chart review, patient seems to have had his ALS meds at noon.  Objective Vitals:   01/24/23 2231 01/25/23 0410 01/25/23 0741 01/25/23 1127  BP: (!) 192/141 125/79 (!) 143/90 135/79  Pulse: 100 78 91 77  Resp:  18 (!) 21 (!) 22  Temp:  99.1 F (37.3 C) 98.2 F (36.8 C) 98.2 F (36.8 C)  TempSrc:  Axillary Oral Oral  SpO2:  94% 94% 94%  Weight:      Height:        GENERAL: No apparent distress.  Nontoxic. HEENT: MMM.  Vision and hearing grossly intact.  NECK: Supple.  No apparent JVD.  RESP:  No IWOB.  Fair aeration bilaterally.  On trilogy vent. CVS:  RRR. Heart sounds normal.  ABD/GI/GU: BS+. Abd soft.  Some suprapubic tenderness but no rebound or guarding. MSK/EXT:   Global weakness more pronounced in LLE. SKIN: no apparent skin lesion or wound.  Noted crusted fresh red blood on his left groin.  Seems like he had some cut to his scrotum with shaving.  No active bleeding.  No mass, skin lesion or tenderness in bilateral groin or upper thigh. NEURO: Awake and alert. Oriented appropriately.  Pronounced LLE weakness. PSYCH: Calm. Normal affect.   Assessment and plan Lower abdominal pain/groin pain: Unclear etiology.  No urine output.  Bladder scan low. -In and out cath -Send urine for urinalysis -NS 500 cc followed by NS 100 cc an hour -CT abdomen and pelvis with contrast -Continue senna twice daily as needed.  Advised patient and family to ask -Continue other meds -Notified patient's RN in person   30 minutes with more than 50% spent in reviewing records, counseling  patient/family and coordinating care.  Addendum -I&O 1800 cc.  Urine cloudy.  Insert Foley catheter.  Patient needs Foley catheter for 7 to 10 days for bladder rest.  Canceled CT abdomen and pelvis.  Renal ultrasound to assess for hydronephrosis.  Started Cardura for possible BPH.

## 2023-01-25 NOTE — Progress Notes (Signed)
Echocardiogram 2D Echocardiogram has been performed.  [name] [name] 01/25/2023, 3:59 PM

## 2023-01-26 DIAGNOSIS — N3000 Acute cystitis without hematuria: Secondary | ICD-10-CM

## 2023-01-26 DIAGNOSIS — I5021 Acute systolic (congestive) heart failure: Secondary | ICD-10-CM | POA: Diagnosis not present

## 2023-01-26 DIAGNOSIS — I249 Acute ischemic heart disease, unspecified: Secondary | ICD-10-CM | POA: Diagnosis not present

## 2023-01-26 DIAGNOSIS — E440 Moderate protein-calorie malnutrition: Secondary | ICD-10-CM | POA: Diagnosis not present

## 2023-01-26 DIAGNOSIS — J449 Chronic obstructive pulmonary disease, unspecified: Secondary | ICD-10-CM | POA: Diagnosis not present

## 2023-01-26 DIAGNOSIS — I213 ST elevation (STEMI) myocardial infarction of unspecified site: Secondary | ICD-10-CM | POA: Diagnosis not present

## 2023-01-26 DIAGNOSIS — R338 Other retention of urine: Secondary | ICD-10-CM

## 2023-01-26 DIAGNOSIS — I2510 Atherosclerotic heart disease of native coronary artery without angina pectoris: Secondary | ICD-10-CM | POA: Diagnosis not present

## 2023-01-26 DIAGNOSIS — N390 Urinary tract infection, site not specified: Secondary | ICD-10-CM | POA: Insufficient documentation

## 2023-01-26 DIAGNOSIS — J9621 Acute and chronic respiratory failure with hypoxia: Secondary | ICD-10-CM | POA: Diagnosis not present

## 2023-01-26 DIAGNOSIS — I2109 ST elevation (STEMI) myocardial infarction involving other coronary artery of anterior wall: Secondary | ICD-10-CM | POA: Diagnosis not present

## 2023-01-26 LAB — COMPREHENSIVE METABOLIC PANEL
ALT: 72 U/L — ABNORMAL HIGH (ref 0–44)
AST: 46 U/L — ABNORMAL HIGH (ref 15–41)
Albumin: 2.8 g/dL — ABNORMAL LOW (ref 3.5–5.0)
Alkaline Phosphatase: 76 U/L (ref 38–126)
Anion gap: 12 (ref 5–15)
BUN: 24 mg/dL — ABNORMAL HIGH (ref 8–23)
CO2: 25 mmol/L (ref 22–32)
Calcium: 8.8 mg/dL — ABNORMAL LOW (ref 8.9–10.3)
Chloride: 92 mmol/L — ABNORMAL LOW (ref 98–111)
Creatinine, Ser: 0.69 mg/dL (ref 0.61–1.24)
GFR, Estimated: >60 mL/min (ref >60)
Glucose, Bld: 124 mg/dL — ABNORMAL HIGH (ref 70–99)
Potassium: 4.1 mmol/L (ref 3.5–5.1)
Sodium: 129 mmol/L — ABNORMAL LOW (ref 135–145)
Total Bilirubin: 0.8 mg/dL (ref 0.3–1.2)
Total Protein: 5.4 g/dL — ABNORMAL LOW (ref 6.5–8.1)

## 2023-01-26 LAB — GLUCOSE, CAPILLARY
Glucose-Capillary: 160 mg/dL — ABNORMAL HIGH (ref 70–99)
Glucose-Capillary: 131 mg/dL — ABNORMAL HIGH (ref 70–99)
Glucose-Capillary: 294 mg/dL — ABNORMAL HIGH (ref 70–99)
Glucose-Capillary: 233 mg/dL — ABNORMAL HIGH (ref 70–99)
Glucose-Capillary: 236 mg/dL — ABNORMAL HIGH (ref 70–99)

## 2023-01-26 LAB — CK: Total CK: 189 U/L (ref 49–397)

## 2023-01-26 LAB — HEMOGLOBIN A1C
Hgb A1c MFr Bld: 7.7 % — ABNORMAL HIGH (ref 4.8–5.6)
Hgb A1c MFr Bld: 7.9 % — ABNORMAL HIGH (ref 4.8–5.6)
Mean Plasma Glucose: 174 mg/dL
Mean Plasma Glucose: 180 mg/dL

## 2023-01-26 LAB — URINE CULTURE: Culture: NO GROWTH

## 2023-01-26 LAB — CBC
HCT: 34.5 % — ABNORMAL LOW (ref 39.0–52.0)
Hemoglobin: 12.4 g/dL — ABNORMAL LOW (ref 13.0–17.0)
MCH: 32.8 pg (ref 26.0–34.0)
MCHC: 35.9 g/dL (ref 30.0–36.0)
MCV: 91.3 fL (ref 80.0–100.0)
Platelets: 188 10*3/uL (ref 150–400)
RBC: 3.78 MIL/uL — ABNORMAL LOW (ref 4.22–5.81)
RDW: 12.4 % (ref 11.5–15.5)
WBC: 9.7 10*3/uL (ref 4.0–10.5)
nRBC: 0 % (ref 0.0–0.2)

## 2023-01-26 LAB — PHOSPHORUS: Phosphorus: 4 mg/dL (ref 2.5–4.6)

## 2023-01-26 LAB — MAGNESIUM: Magnesium: 1.9 mg/dL (ref 1.7–2.4)

## 2023-01-26 MED ORDER — TRAZODONE HCL 100 MG PO TABS
100.0000 mg | ORAL_TABLET | Freq: Every evening | ORAL | Status: DC | PRN
  Administered 2023-01-26 – 2023-01-28 (×3): 100 mg via ORAL
  Filled 2023-01-26 (×3): qty 1

## 2023-01-26 MED ORDER — POLYETHYLENE GLYCOL 3350 17 G PO PACK
17.0000 g | PACK | Freq: Two times a day (BID) | ORAL | Status: DC
  Administered 2023-01-26 – 2023-01-29 (×7): 17 g
  Filled 2023-01-26 (×7): qty 1

## 2023-01-26 MED ORDER — SENNOSIDES 8.8 MG/5ML PO SYRP
5.0000 mL | ORAL_SOLUTION | Freq: Two times a day (BID) | ORAL | Status: DC
  Administered 2023-01-26 (×2): 5 mL via ORAL
  Filled 2023-01-26 (×2): qty 5

## 2023-01-26 MED ORDER — INSULIN GLARGINE-YFGN 100 UNIT/ML ~~LOC~~ SOLN
10.0000 [IU] | Freq: Every day | SUBCUTANEOUS | Status: DC
  Administered 2023-01-26 – 2023-01-29 (×4): 10 [IU] via SUBCUTANEOUS
  Filled 2023-01-26 (×4): qty 0.1

## 2023-01-26 MED ORDER — FUROSEMIDE 20 MG PO TABS
20.0000 mg | ORAL_TABLET | Freq: Every day | ORAL | Status: DC
  Administered 2023-01-26 – 2023-01-29 (×4): 20 mg via ORAL
  Filled 2023-01-26 (×4): qty 1

## 2023-01-26 MED ORDER — POLYETHYLENE GLYCOL 3350 17 G PO PACK: 17.0000 g | PACK | Freq: Two times a day (BID) | ORAL | Status: DC

## 2023-01-26 MED ORDER — METOPROLOL TARTRATE 25 MG PO TABS
25.0000 mg | ORAL_TABLET | Freq: Two times a day (BID) | ORAL | Status: DC
  Administered 2023-01-26 – 2023-01-29 (×6): 25 mg
  Filled 2023-01-26 (×6): qty 1

## 2023-01-26 MED ORDER — METOPROLOL SUCCINATE ER 50 MG PO TB24: 50.0000 mg | ORAL_TABLET | Freq: Every day | ORAL | Status: DC

## 2023-01-26 NOTE — Progress Notes (Signed)
PROGRESS NOTE  [name] [name] [id number] DOB: [birth year]   PCP: Colon Branch, MD  Patient is from: Home.   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  Troponin elevated to 1000.  EKG with ST elevation in lateral leads.  BNP 520.  CXR concerning for bilateral infiltrates. Na 125.  Cardiology consulted.  Patient was started on IV heparin, ceftriaxone and azithromycin.  Patient and family deferred invasive procedures.  TTE ordered.  Cardiology following.  TTE with LVEF of 30 to 35% and possible Takotsubo cardiomyopathy.  Remains on therapeutic dose Lovenox for a total of 72 hours.  Cardiology following.  Hospital course complicated by acute urinary retention and bladder over distention.  He had about 1.8 L urine on I and O cath.  Renal ultrasound concerning for UTI but no hydronephrosis.  Urinalysis with pyuria.  Foley catheter inserted.  Started on IV ceftriaxone and Cardura.  Urine culture pending.    Subjective: Seen and examined earlier this morning.  No major events overnight of this morning.  Reports having a better night.  No complaints other than some irritation from the Foley.  Patient's son at bedside.  Objective: Vitals:   01/25/23 2348 01/26/23 0437 01/26/23 0759 01/26/23 1156  BP: 122/65 127/65 127/78 111/65  Pulse: 78 73 70 68  Resp: 18 18 18 18   Temp: 99.5 F (37.5 C) 99.2 F (37.3 C) 99.1 F (37.3 C) 99 F (37.2 C)  TempSrc: Axillary Axillary Axillary Axillary  SpO2: 96% 96% 99% 94%  Weight:      Height:        Examination:  GENERAL: No apparent distress.  Nontoxic. HEENT: MMM.  Vision and hearing grossly intact.  NECK: Supple.  No  apparent JVD.  RESP:  No IWOB.  Fair aeration bilaterally. CVS:  RRR. Heart sounds normal.  ABD/GI/GU: BS+. Abd soft, NTND.  G-tube and Foley in place. MSK/EXT:  Moves extremities.  Globally weak.  More pronounced in LLE. SKIN: no apparent skin lesion or wound NEURO: Awake, alert and oriented appropriately.  LLL weakness (chronic). PSYCH: Calm. Normal affect.   Procedures:  None  Microbiology summarized: Blood cultures NGTD Urine culture pending  Assessment and plan: Principal Problem:   STEMI (ST elevation myocardial infarction) Active Problems:   Mixed hyperlipidemia   DJD (degenerative joint disease)   Hypothyroidism   Chronic obstructive pulmonary disease   Essential hypertension   Coronary artery disease involving native coronary artery of native heart without angina pectoris   ALS (amyotrophic lateral sclerosis)   CAP (community acquired pneumonia)   Malnutrition of moderate degree   Bacterial pneumonia   Hyponatremia   Acute ST elevation myocardial infarction (STEMI) of anterolateral wall   Acute urinary retention   Urinary tract infection   Acute HFrEF (heart failure with reduced ejection fraction)  STEMI/CAD: Patient presents with acute dyspnea.  Troponin 1054>> 941.  EKG with STE in lateral leads.  Hemodynamically stable.  Patient and family not interested in invasive intervention.  LDL 60.  TTE with LVEF of 30 to 35%. -Cardiology on board-on Lovenox, aspirin, Plavix, metoprolol and Lipitor   Acute HFrEF: TTE with LVEF of 30 to 35%, Takotsubo cardiomyopathy.  BNP elevated  although he did not look fluid overloaded. -Cardiology on board-Lasix added. -GDMT initiated. -Strict intake and output, daily weight, renal functions and electrolytes  Possible bacterial pneumonia: Patient presents with acute dyspnea.  CXR concerning for bilateral infiltrates.  Had mild leukocytosis.  This could be pneumonitis as well.  -Continue ceftriaxone -Pulmonary toilet -Continue home  glycopyrrolate  Essential hypertension -Continue metoprolol   Acquired hypothyroidism: TSH 11.  Free T41.2. -Continue Synthroid -Recheck TSH in 4 to 6 weeks.   Mixed hyperlipidemia -Continue Lipitor   Uncontrolled NIDDM-2 with hyperglycemia: A1c 8.0% in 11/2022.  On metformin at home. Recent Labs  Lab 01/25/23 2024 01/25/23 2345 01/26/23 0444 01/26/23 0822 01/26/23 1155  GLUCAP 229* 245* 160* 131* 294*  -Continue SSI-moderate every 4 hours -Add Semglee 10 units daily -Continue statin.   ALS: PEG and trilogy dependent. -Continue riluzole -Continue BiPAP/Trilogy from home -Continue bolus tube feed per RD.  Acute urinary retention/possible UTI: Patient had discomfort in the suprapubic and groin area.  Bladder scan was negative but he had 1.8 L UOP on I and O cath on 4/1.  Urine was cloudy.  Renal US negative for hydronephrosis but concerning for UTI.  UA with pyuria. -Continue Foley catheter for 7 to 10 days for bladder rest -Started Cardura for possible BPH. -Already on IV ceftriaxone -Follow urine culture   PEG tube dependent -As above  Hyponatremia: Suspect hypovolemia but urine sodium elevated.  Improved with IV fluid. -Use home Gatorade instead of free water 40 feet.  Acute hypoxic respiratory failure ruled out.  No documented desaturation.   Moderate malnutrition Body mass index is 28 kg/m. Nutrition Problem: Moderate Malnutrition Etiology: chronic illness (ALS, dysphagia now s/p G-tube placement) Signs/Symptoms: mild fat depletion, moderate fat depletion, mild muscle depletion, moderate muscle depletion Interventions: Refer to RD note for recommendations   DVT prophylaxis:  SCDs Start: 01/24/23 1738 On full dose anticoagulation  Code Status: DNR/DNI Family Communication: Updated patient's son at bedside Level of care: Progressive Status is: Inpatient Remains inpatient appropriate because: STEMI, HFrEF, possible pneumonia and hyponatremia   Final  disposition: TBD Consultants:  Cardiology  55 minutes with more than 50% spent in reviewing records, counseling patient/family and coordinating care.   Sch Meds:  Scheduled Meds:  aspirin  81 mg Per Tube Daily   atorvastatin  80 mg Per Tube Daily   Chlorhexidine Gluconate Cloth  6 each Topical Q0600   clopidogrel  75 mg Per Tube Daily   doxazosin  1 mg Per Tube Daily   enoxaparin (LOVENOX) injection  100 mg Subcutaneous BID   feeding supplement (JEVITY 1.5 CAL/FIBER)  237 mL Per Tube Q24H   feeding supplement (JEVITY 1.5 CAL/FIBER)  474 mL Per Tube 3 times per day   free water  250 mL Per Tube 4 times per day   furosemide  20 mg Oral Daily   glycopyrrolate  2 mg Per Tube TID   insulin aspart  0-15 Units Subcutaneous Q4H   insulin glargine-yfgn  10 Units Subcutaneous Daily   levothyroxine  88 mcg Per Tube QAC breakfast   melatonin  1.5 mg Per Tube QHS   [START ON 01/27/2023] metoprolol succinate  50 mg Oral Daily   metoprolol tartrate  25 mg Per Tube BID   polyethylene glycol  17 g Per Tube BID   riluzole  50 mg Oral Q12H   sennosides  5 mL Oral BID   Continuous Infusions:  cefTRIAXone (ROCEPHIN)  IV 2 g (01/26/23 0936)   PRN Meds:.fentaNYL (  SUBLIMAZE) injection, guaiFENesin-dextromethorphan, metoprolol tartrate, ondansetron **OR** ondansetron (ZOFRAN) IV  Antimicrobials: Anti-infectives (From admission, onward)    Start     Dose/Rate Route Frequency Ordered Stop   01/25/23 1000  cefTRIAXone (ROCEPHIN) 2 g in sodium chloride 0.9 % 100 mL IVPB        2 g 200 mL/hr over 30 Minutes Intravenous Every 24 hours 01/24/23 1750     01/24/23 1400  cefTRIAXone (ROCEPHIN) 1 g in sodium chloride 0.9 % 100 mL IVPB        1 g 200 mL/hr over 30 Minutes Intravenous  Once 01/24/23 1354 01/24/23 1600   01/24/23 1400  azithromycin (ZITHROMAX) 500 mg in sodium chloride 0.9 % 250 mL IVPB        500 mg 250 mL/hr over 60 Minutes Intravenous  Once 01/24/23 1354 01/24/23 1815        I have  personally reviewed the following labs and images: CBC: Recent Labs  Lab 01/24/23 1140 01/24/23 1148 01/25/23 0205 01/26/23 0644  WBC 12.0*  --  10.5 9.7  NEUTROABS 9.8*  --   --   --   HGB 14.6 15.6 12.9* 12.4*  HCT 41.4 46.0 36.8* 34.5*  MCV 93.5  --  92.2 91.3  PLT 212  --  212 188   BMP &GFR Recent Labs  Lab 01/24/23 1140 01/24/23 1148 01/25/23 0205 01/26/23 0644  NA 125* 125* 122* 129*  K 4.3 4.3 3.9 4.1  CL 87* 89* 85* 92*  CO2 25  --  24 25  GLUCOSE 183* 176* 199* 124*  BUN 24* 29* 24* 24*  CREATININE 0.78 0.70 0.76 0.69  CALCIUM 9.3  --  8.5* 8.8*  MG 1.8  --  1.8 1.9  PHOS  --   --  3.7 4.0   Estimated Creatinine Clearance: 114 mL/min (by C-G formula based on SCr of 0.69 mg/dL). Liver & Pancreas: Recent Labs  Lab 01/24/23 1140 01/26/23 0644  AST 49* 46*  ALT 55* 72*  ALKPHOS 104 76  BILITOT 1.2 0.8  PROT 6.8 5.4*  ALBUMIN 3.7 2.8*   No results for input(s): "LIPASE", "AMYLASE" in the last 168 hours. No results for input(s): "AMMONIA" in the last 168 hours. Diabetic: Recent Labs    01/24/23 1603 01/25/23 0205  HGBA1C 7.7* 7.9*   Recent Labs  Lab 01/25/23 2024 01/25/23 2345 01/26/23 0444 01/26/23 0822 01/26/23 1155  GLUCAP 229* 245* 160* 131* 294*   Cardiac Enzymes: Recent Labs  Lab 01/26/23 0644  CKTOTAL 189   No results for input(s): "PROBNP" in the last 8760 hours. Coagulation Profile: No results for input(s): "INR", "PROTIME" in the last 168 hours. Thyroid Function Tests: Recent Labs    01/24/23 1823 01/25/23 0205  TSH  --  11.025*  FREET4 1.20*  --    Lipid Profile: Recent Labs    01/24/23 1603 01/25/23 0205  CHOL 121 122  HDL 52 52  LDLCALC 63 60  TRIG 29 48  CHOLHDL 2.3 2.3   Anemia Panel: No results for input(s): "VITAMINB12", "FOLATE", "FERRITIN", "TIBC", "IRON", "RETICCTPCT" in the last 72 hours. Urine analysis:    Component Value Date/Time   COLORURINE YELLOW 01/25/2023 1501   APPEARANCEUR CLOUDY (A)  01/25/2023 1501   LABSPEC 1.011 01/25/2023 1501   PHURINE 7.0 01/25/2023 1501   GLUCOSEU NEGATIVE 01/25/2023 1501   GLUCOSEU NEGATIVE 07/28/2016 1226   HGBUR SMALL (A) 01/25/2023 1501   BILIRUBINUR NEGATIVE 01/25/2023 1501   KETONESUR NEGATIVE 01/25/2023 1501  PROTEINUR 30 (A) 01/25/2023 1501   UROBILINOGEN 0.2 07/28/2016 1226   NITRITE NEGATIVE 01/25/2023 1501   LEUKOCYTESUR LARGE (A) 01/25/2023 1501   Sepsis Labs: Invalid input(s): "PROCALCITONIN", "LACTICIDVEN"  Microbiology: Recent Results (from the past 240 hour(s))  Culture, blood (Routine X 2) w Reflex to ID Panel     Status: None (Preliminary result)   Collection Time: 01/24/23  6:25 PM   Specimen: BLOOD RIGHT HAND  Result Value Ref Range Status   Specimen Description BLOOD RIGHT HAND  Final   Special Requests   Final    BOTTLES DRAWN AEROBIC AND ANAEROBIC Blood Culture adequate volume   Culture   Final    NO GROWTH 2 DAYS Performed at [facility], [place] [address], [place], [place] [address]    Report Status PENDING  Incomplete  Culture, blood (Routine X 2) w Reflex to ID Panel     Status: None (Preliminary result)   Collection Time: 01/24/23  6:25 PM   Specimen: BLOOD RIGHT HAND  Result Value Ref Range Status   Specimen Description BLOOD RIGHT HAND  Final   Special Requests   Final    BOTTLES DRAWN AEROBIC AND ANAEROBIC Blood Culture adequate volume   Culture   Final    NO GROWTH 2 DAYS Performed at [facility], [place] [address], [place], [place] [address]    Report Status PENDING  Incomplete    Radiology Studies: DG Abd Portable 1V  Result Date: 01/25/2023 CLINICAL DATA:  Abdominal pain. EXAM: PORTABLE ABDOMEN - 1 VIEW COMPARISON:  None Available. FINDINGS: The bowel gas pattern is normal without gaseous distention. No radio-opaque calculi or other significant radiographic abnormality are seen. Increased stool noted consistent with constipation. IMPRESSION: Constipation. Electronically Signed   By:  Sammie Bench M.D.   On: 01/25/2023 17:36   US RENAL  Result Date: 01/25/2023 CLINICAL DATA:  [id number] Acute urinary retention [id number] EXAM: RENAL / URINARY TRACT ULTRASOUND COMPLETE COMPARISON:  None Available. FINDINGS: Right Kidney: Renal measurements: 11.2 x 5.4 x 5.5 cm = volume: 172.3 mL. Echogenicity within normal limits. No mass or hydronephrosis visualized. Left Kidney: Renal measurements: 12.4 x 5.8 x 5.0 cm = volume: 186.8 mL. Echogenicity within normal limits. No mass or hydronephrosis visualized. Bladder: Circumferential bladder wall thickening. There is confluent echogenic debris layering along the posterior bladder without internal vascularity. Other: Limited exam due to body habitus. IMPRESSION: No hydronephrosis. Circumferential bladder wall thickening with confluent echogenic debris layering along the posterior bladder which could be infectious or potentially blood clot. Correlate with urinalysis. Electronically Signed   By: Maurine Simmering M.D.   On: 01/25/2023 16:58   ECHOCARDIOGRAM COMPLETE  Result Date: 01/25/2023    ECHOCARDIOGRAM REPORT   Patient Name:   [name] [name] Date of Exam: 01/25/2023 Medical Rec #:  [id number]    Height:       76.0 in Accession #:    [id number]   Weight:       230.0 lb Date of Birth:  1951/01/12    BSA:          2.351 m Patient Age:    5 years     BP:           125/79 mmHg Patient Gender: M            HR:           78 bpm. Exam Location:  Inpatient Procedure: 2D Echo, Cardiac Doppler, Color Doppler and Intracardiac  Opacification Agent Indications:    CAD Native Vessel I25.10  History:        Patient has no prior history of Echocardiogram examinations. CAD                 and Acute MI, COPD, Arrythmias:Bradycardia; Risk                 Factors:Hypertension, Diabetes and Dyslipidemia.  Sonographer:    Ronny Flurry Referring Phys: [id number] OLADAPO ADEFESO IMPRESSIONS  1. Difficult acoustic windows even with Definity use. Akinesis of the distal 1/2 of LV,  best appreciated in apical 4 Chamber view. Overall appears consistent with Takotsubo's cardiomyopathy.. Left ventricular ejection fraction, by estimation, is 30 to 35%. The left ventricle has moderately decreased function.  2. Right ventricular systolic function is normal. The right ventricular size is normal.  3. A small pericardial effusion is present.  4. The mitral valve is normal in structure. Trivial mitral valve regurgitation.  5. The aortic valve is grossly normal. Aortic valve regurgitation is not visualized. FINDINGS  Left Ventricle: Difficult acoustic windows even with Definity use. Akinesis of the distal 1/2 of LV, best appreciated in apical 4 Chamber view. Overall appears consistent with Takotsubo's cardiomyopathy. Left ventricular ejection fraction, by estimation, is 30 to 35%. The left ventricle has moderately decreased function. Definity contrast agent was given IV to delineate the left ventricular endocardial borders. The left ventricular internal cavity size was normal in size. Right Ventricle: The right ventricular size is normal. Right vetricular wall thickness was not assessed. Right ventricular systolic function is normal. Left Atrium: Left atrial size was normal in size. Right Atrium: Right atrial size was normal in size. Pericardium: A small pericardial effusion is present. Mitral Valve: The mitral valve is normal in structure. Trivial mitral valve regurgitation. Tricuspid Valve: The tricuspid valve is normal in structure. Tricuspid valve regurgitation is trivial. Aortic Valve: The aortic valve is grossly normal. Aortic valve regurgitation is not visualized. Aortic valve mean gradient measures 4.0 mmHg. Aortic valve peak gradient measures 7.5 mmHg. Aortic valve area, by VTI measures 2.81 cm. Pulmonic Valve: The pulmonic valve was not well visualized. Pulmonic valve regurgitation is not visualized. No evidence of pulmonic stenosis. Aorta: The aortic root and ascending aorta are structurally  normal, with no evidence of dilitation. IAS/Shunts: No atrial level shunt detected by color flow Doppler.  LEFT VENTRICLE PLAX 2D LVOT diam:     2.20 cm      Diastology LV SV:         64           LV e' medial:    5.28 cm/s LV SV Index:   27           LV E/e' medial:  8.8 LVOT Area:     3.80 cm     LV e' lateral:   7.15 cm/s                             LV E/e' lateral: 6.5  LV Volumes (MOD) LV vol d, MOD A4C: 137.0 ml LV vol s, MOD A4C: 90.1 ml LV SV MOD A4C:     137.0 ml RIGHT VENTRICLE RV S prime:     17.10 cm/s TAPSE (M-mode): 1.8 cm LEFT ATRIUM             Index LA Vol (A2C):   38.5 ml 16.38 ml/m LA Vol (A4C):   38.1 ml 16.21  ml/m LA Biplane Vol: 39.8 ml 16.93 ml/m  AORTIC VALVE AV Area (Vmax):    2.73 cm AV Area (Vmean):   2.77 cm AV Area (VTI):     2.81 cm AV Vmax:           137.00 cm/s AV Vmean:          90.900 cm/s AV VTI:            0.229 m AV Peak Grad:      7.5 mmHg AV Mean Grad:      4.0 mmHg LVOT Vmax:         98.43 cm/s LVOT Vmean:        66.167 cm/s LVOT VTI:          0.169 m LVOT/AV VTI ratio: 0.74  AORTA Ao Root diam: 3.60 cm Ao Asc diam:  3.40 cm MITRAL VALVE MV Area (PHT): 2.98 cm    SHUNTS MV Decel Time: 255 msec    Systemic VTI:  0.17 m MV E velocity: 46.50 cm/s  Systemic Diam: 2.20 cm MV A velocity: 78.03 cm/s MV E/A ratio:  0.60 Dorris Carnes MD Electronically signed by Dorris Carnes MD Signature Date/Time: 01/25/2023/4:26:08 PM    Final       Hussain [facility] [address] [place]  If 7PM-7AM, please contact night-coverage [email or web address] 01/26/2023, 1:25 PM

## 2023-01-26 NOTE — Progress Notes (Addendum)
Rounding Note    Patient Name: [name] [name] Date of Encounter: 01/26/2023  [place] Cardiologist: Buford Dresser, MD   Subjective   Sitting up in bed. Son at the bedside.   Does not necessarily note chest discomfort, but it definitely his breathing is not as well as it had been healed.  Did not use to have to use the CPAP continuously.  Inpatient Medications    Scheduled Meds:  aspirin  81 mg Per Tube Daily   atorvastatin  80 mg Per Tube Daily   Chlorhexidine Gluconate Cloth  6 each Topical Q0600   clopidogrel  75 mg Per Tube Daily   doxazosin  1 mg Per Tube Daily   enoxaparin (LOVENOX) injection  100 mg Subcutaneous BID   feeding supplement (JEVITY 1.5 CAL/FIBER)  237 mL Per Tube Q24H   feeding supplement (JEVITY 1.5 CAL/FIBER)  474 mL Per Tube 3 times per day   free water  250 mL Per Tube 4 times per day   glycopyrrolate  2 mg Per Tube TID   insulin aspart  0-15 Units Subcutaneous Q4H   levothyroxine  88 mcg Per Tube QAC breakfast   melatonin  1.5 mg Per Tube QHS   metoprolol tartrate  25 mg Per Tube BID   polyethylene glycol  17 g Per Tube BID   riluzole  50 mg Oral Q12H   sennosides  5 mL Oral BID   Continuous Infusions:  cefTRIAXone (ROCEPHIN)  IV 2 g (01/26/23 0936)   PRN Meds: fentaNYL (SUBLIMAZE) injection, guaiFENesin-dextromethorphan, metoprolol tartrate, ondansetron **OR** ondansetron (ZOFRAN) IV   Vital Signs    Vitals:   01/25/23 2031 01/25/23 2348 01/26/23 0437 01/26/23 0759  BP: 120/61 122/65 127/65 127/78  Pulse: 75 78 73 70  Resp: 20 18 18 18   Temp: 97.8 F (36.6 C) 99.5 F (37.5 C) 99.2 F (37.3 C) 99.1 F (37.3 C)  TempSrc: Oral Axillary Axillary Axillary  SpO2: 96% 96% 96% 99%  Weight:      Height:        Intake/Output Summary (Last 24 hours) at 01/26/2023 0948 Last data filed at 01/26/2023 0444 Gross per 24 hour  Intake 1898 ml  Output 4675 ml  Net -2777 ml      01/24/2023    1:37 PM 07/07/2022    9:44 AM  05/21/2022   11:28 AM  Last 3 Weights  Weight (lbs) 230 lb 231 lb 8 oz 230 lb 8 oz  Weight (kg) 104.327 kg 105.008 kg 104.554 kg      Telemetry    Sinus Rhythm - Personally Reviewed  ECG    No new tracing this morning  Physical Exam   GEN: No acute distress, on NIV  ->  Neck: No JVD Cardiac: RRR, no murmurs, rubs, or gallops.  Respiratory: Clear to auscultation bilaterally. has facemask CPAP, and uses CoughAssist GI: Soft, nontender, non-distended  MS: No edema; No deformity. 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 Psych: Normal affect   Labs    High Sensitivity Troponin:   Recent Labs  Lab 01/24/23 1140 01/24/23 1329  TROPONINIHS 1,054* 941*     Chemistry Recent Labs  Lab 01/24/23 1140 01/24/23 1148 01/25/23 0205 01/26/23 0644  NA 125* 125* 122* 129*  K 4.3 4.3 3.9 4.1  CL 87* 89* 85* 92*  CO2 25  --  24 25  GLUCOSE 183* 176* 199* 124*  BUN 24* 29* 24* 24*  CREATININE 0.78 0.70 0.76 0.69  CALCIUM 9.3  --  8.5* 8.8*  MG 1.8  --  1.8 1.9  PROT 6.8  --   --  5.4*  ALBUMIN 3.7  --   --  2.8*  AST 49*  --   --  46*  ALT 55*  --   --  72*  ALKPHOS 104  --   --  76  BILITOT 1.2  --   --  0.8  GFRNONAA >60  --  >60 >60  ANIONGAP 13  --  8 12    Lipids  Recent Labs  Lab 01/25/23 0205  CHOL 122  TRIG 48  HDL 52  LDLCALC 60  CHOLHDL 2.3    Hematology Recent Labs  Lab 01/24/23 1140 01/24/23 1148 01/25/23 0205 01/26/23 0644  WBC 12.0*  --  10.5 9.7  RBC 4.43  --  3.99* 3.78*  HGB 14.6 15.6 12.9* 12.4*  HCT 41.4 46.0 36.8* 34.5*  MCV 93.5  --  92.2 91.3  MCH 33.0  --  32.3 32.8  MCHC 35.3  --  35.1 35.9  RDW 12.3  --  12.2 12.4  PLT 212  --  212 188   Thyroid  Recent Labs  Lab 01/24/23 1823 01/25/23 0205  TSH  --  11.025*  FREET4 1.20*  --     BNP Recent Labs  Lab 01/24/23 1140  BNP 520.9*    DDimer No results for input(s): "DDIMER" in the last 168 hours.   Radiology    DG Abd Portable 1V  Result Date:  01/25/2023 CLINICAL DATA:  Abdominal pain. EXAM: PORTABLE ABDOMEN - 1 VIEW COMPARISON:  None Available. FINDINGS: The bowel gas pattern is normal without gaseous distention. No radio-opaque calculi or other significant radiographic abnormality are seen. Increased stool noted consistent with constipation. IMPRESSION: Constipation. Electronically Signed   By: Sammie Bench M.D.   On: 01/25/2023 17:36   US RENAL  Result Date: 01/25/2023 CLINICAL DATA:  [id number] Acute urinary retention [id number] EXAM: RENAL / URINARY TRACT ULTRASOUND COMPLETE COMPARISON:  None Available. FINDINGS: Right Kidney: Renal measurements: 11.2 x 5.4 x 5.5 cm = volume: 172.3 mL. Echogenicity within normal limits. No mass or hydronephrosis visualized. Left Kidney: Renal measurements: 12.4 x 5.8 x 5.0 cm = volume: 186.8 mL. Echogenicity within normal limits. No mass or hydronephrosis visualized. Bladder: Circumferential bladder wall thickening. There is confluent echogenic debris layering along the posterior bladder without internal vascularity. Other: Limited exam due to body habitus. IMPRESSION: No hydronephrosis. Circumferential bladder wall thickening with confluent echogenic debris layering along the posterior bladder which could be infectious or potentially blood clot. Correlate with urinalysis. Electronically Signed   By: Maurine Simmering M.D.   On: 01/25/2023 16:58   DG Chest Portable 1 View  Result Date: 01/24/2023 CLINICAL DATA:  Shortness of breath.  History of ALS and diabetes. EXAM: PORTABLE CHEST 1 VIEW COMPARISON:  No prior radiographs available.  Chest CT 01/14/2022. FINDINGS: 1153 hours. There are lower lung volumes. The heart size and mediastinal contours are stable. New patchy left greater than right airspace opacities may reflect atelectasis or early infiltrates. Possible small left pleural effusion. No evidence of pneumothorax or acute osseous abnormality. Telemetry leads overlie the chest. IMPRESSION: Lower lung volumes with  new patchy left greater than right airspace opacities which may reflect atelectasis or early infiltrates. Possible small left pleural effusion. Electronically Signed   By: Richardean Sale M.D.   On: 01/24/2023 12:07    Cardiac Studies   Echo: 01/25/2023: Difficult  acoustic windows.  There appears to be akinesis of the distal one half of the anterior LV wall.  As appearance of a Takotsubo cardiomyopathy (although cannot exclude anterior infarct) with a EF 30 to 35%.  Moderate decreased function.  Small pericardial effusion.  Normal valves.  Patient Profile     72 y.o. male with a hx of HTN, HLD, type 2 DM, DJD,PVCs, CAD per coronary CT, ALS, who is being seen 01/24/2023 for the evaluation of STEMI at the request of Dr Josephine Cables.   Assessment & Plan    Likely delayed presentation of anterior STEMI Coronary calcifications on CT -- EKG on admission showed STE in lateral leads, per discussion with family on admission decision was made to treat medically.  No chest pain.  Initial plan was to treat with coagulation for 48-72 hours, per discussion with family and patient frequent lab draws are preventing him from resting/freq interruptions, was transitioned to therapeutic Lovenox for total of 72 hrs -> last dose would likely be tomorrow morning. -- Continue aspirin, Plavix, statin, metoprolol (unfortunate, not able to consolidate Lopressor to Toprol because of inability to crush delayed release medications per G-tube)  HFrEF - ICM; BNP 520 -- Echo showed LVEF of 30-35%, takotsubo cardiomyopathy reported. Though even his presentation more consistent with infarct -> He does note having more orthopnea, and is using his CPAP/BiPAP more frequently than before.  Suspect this may be related to elevated filling pressures, especially in light of elevated BNP level of 520.  Agree with adding low-dose Lasix. -- GDMT: Continue Lopressor for now, (for GDMT with reduced EF, could consider converting to lower dose of  carvedilol which can be crushed), add lasix 20mg  daily. 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 --On chronic NIV PTA, chest x-ray on admission suggestive of atelectasis versus early infiltrates -- On IV antibiotics per primary   HTN -- Continue metoprolol (however depending on blood pressure to consider in the future switching to carvedilol given reduced EF and inability to convert from Lopressor to Toprol)   HLD -- Continue atorvastatin 80 mg daily   DM -- On SSI    For questions or updates, please contact [place] Please consult [email or web address] for contact info under        Signed, Reino Bellis, NP  01/26/2023, 9:48 AM     ATTENDING ATTESTATION  I have seen, examined and evaluated the patient this evening on rounds after initially seen by Harlan Stains, NP in the morning.   After reviewing all the available data and chart, we discussed the patients laboratory, study & physical findings as well as symptoms in detail.  I agree with her findings, examination as well as impression recommendations as per our discussion.    Attending adjustments noted in italics.   Unfortunately, a I suspect that he was a late presentation of anterior MI.  Troponins were already on the way down and relatively low given the extent of wall motion normality and EF reduction.  He seems relatively comfortable and the plan was 72 hours of anticoagulation for medical management.  Clearly with ALS the family expressed wishes to avoid invasive procedures.  He does have reduced EF consistent with ischemic cardiomyopathy more so than Takotsubo.  Likely related to the LAD infarct.  BNP was elevated and he does have orthopnea.  Agree with Lasix. Unfortunate unable to consolidate Lopressor  to Toprol.  By convention, would consider converting to carvedilol which can  be given via G-tube, however the dosing will be difficult to determine.  Since he is tolerating Lopressor would continue for now and then consider changing in the outpatient setting.    TRH services treating for likely CAP and potential UTI.  He will likely be here in the hospital for a few more days potentially.  No active cardiac interventions going forward beyond stopping anticoagulation tomorrow.   [place] will sign off.   Medication Recommendations:  With ACS as a likely etiology of his presentation, agree with DAPT (ASA/Plavix) for up to 1 year.  With no PCI, DAPT can be interrupted.  Further treatment will consist of beta-blocker, statin, and low-dose Lasix. => Potentially consider low-dose ARB before discharge if blood pressures are elevated.   Other recommendations (labs, testing, etc): With initiation of Lasix, would be reasonable to reassess chemistry panel in 2 to 3 days after initiating. Follow up as an outpatient: If the patient and family desire follow-up, would be happy to set this up in order to help adjust outpatient medications with reduced EF.     Leonie Man, MD, MS Glenetta Hew, M.D., M.S. Interventional Cardiologist  [place]  Pager # [phone] Phone # [phone] [address] [place], [place] [address]

## 2023-01-27 DIAGNOSIS — G1221 Amyotrophic lateral sclerosis: Secondary | ICD-10-CM | POA: Diagnosis not present

## 2023-01-27 DIAGNOSIS — I10 Essential (primary) hypertension: Secondary | ICD-10-CM | POA: Diagnosis not present

## 2023-01-27 DIAGNOSIS — J159 Unspecified bacterial pneumonia: Secondary | ICD-10-CM | POA: Diagnosis not present

## 2023-01-27 DIAGNOSIS — I249 Acute ischemic heart disease, unspecified: Principal | ICD-10-CM

## 2023-01-27 DIAGNOSIS — I213 ST elevation (STEMI) myocardial infarction of unspecified site: Secondary | ICD-10-CM | POA: Diagnosis not present

## 2023-01-27 LAB — COMPREHENSIVE METABOLIC PANEL
ALT: 68 U/L — ABNORMAL HIGH (ref 0–44)
AST: 38 U/L (ref 15–41)
Albumin: 2.8 g/dL — ABNORMAL LOW (ref 3.5–5.0)
Alkaline Phosphatase: 75 U/L (ref 38–126)
Anion gap: 12 (ref 5–15)
BUN: 26 mg/dL — ABNORMAL HIGH (ref 8–23)
CO2: 27 mmol/L (ref 22–32)
Calcium: 8.7 mg/dL — ABNORMAL LOW (ref 8.9–10.3)
Chloride: 89 mmol/L — ABNORMAL LOW (ref 98–111)
Creatinine, Ser: 0.76 mg/dL (ref 0.61–1.24)
GFR, Estimated: >60 mL/min (ref >60)
Glucose, Bld: 208 mg/dL — ABNORMAL HIGH (ref 70–99)
Potassium: 4 mmol/L (ref 3.5–5.1)
Sodium: 128 mmol/L — ABNORMAL LOW (ref 135–145)
Total Bilirubin: 0.7 mg/dL (ref 0.3–1.2)
Total Protein: 5.3 g/dL — ABNORMAL LOW (ref 6.5–8.1)

## 2023-01-27 LAB — GLUCOSE, CAPILLARY
Glucose-Capillary: 101 mg/dL — ABNORMAL HIGH (ref 70–99)
Glucose-Capillary: 104 mg/dL — ABNORMAL HIGH (ref 70–99)
Glucose-Capillary: 196 mg/dL — ABNORMAL HIGH (ref 70–99)
Glucose-Capillary: 207 mg/dL — ABNORMAL HIGH (ref 70–99)
Glucose-Capillary: 312 mg/dL — ABNORMAL HIGH (ref 70–99)
Glucose-Capillary: 324 mg/dL — ABNORMAL HIGH (ref 70–99)

## 2023-01-27 LAB — CBC
HCT: 34.7 % — ABNORMAL LOW (ref 39.0–52.0)
Hemoglobin: 12.2 g/dL — ABNORMAL LOW (ref 13.0–17.0)
MCH: 32.6 pg (ref 26.0–34.0)
MCHC: 35.2 g/dL (ref 30.0–36.0)
MCV: 92.8 fL (ref 80.0–100.0)
Platelets: 183 10*3/uL (ref 150–400)
RBC: 3.74 MIL/uL — ABNORMAL LOW (ref 4.22–5.81)
RDW: 12.5 % (ref 11.5–15.5)
WBC: 8.8 10*3/uL (ref 4.0–10.5)
nRBC: 0 % (ref 0.0–0.2)

## 2023-01-27 LAB — PHOSPHORUS: Phosphorus: 3.5 mg/dL (ref 2.5–4.6)

## 2023-01-27 LAB — MAGNESIUM: Magnesium: 1.8 mg/dL (ref 1.7–2.4)

## 2023-01-27 LAB — STREP PNEUMONIAE URINARY ANTIGEN: Strep Pneumo Urinary Antigen: NEGATIVE

## 2023-01-27 MED ORDER — SENNOSIDES-DOCUSATE SODIUM 8.6-50 MG PO TABS
2.0000 | ORAL_TABLET | Freq: Two times a day (BID) | ORAL | Status: DC
  Administered 2023-01-27 – 2023-01-29 (×5): 2 via ORAL
  Filled 2023-01-27 (×5): qty 2

## 2023-01-27 MED ORDER — BISACODYL 10 MG RE SUPP
10.0000 mg | Freq: Once | RECTAL | Status: AC
  Administered 2023-01-27: 10 mg via RECTAL
  Filled 2023-01-27: qty 1

## 2023-01-27 MED ORDER — SODIUM CHLORIDE 0.9 % IV SOLN: INTRAVENOUS | Status: DC | PRN

## 2023-01-27 NOTE — Progress Notes (Signed)
Heart Failure Navigator Progress Note  Assessed for Heart & Vascular TOC clinic readiness.  Patient EF 30-35%, with Advanced ALS, No HF TOC at this time. .   Navigator will sign off.    Earnestine Leys, BSN, Clinical cytogeneticist Only

## 2023-01-27 NOTE — Inpatient Diabetes Management (Signed)
Inpatient Diabetes Program Recommendations  AACE/ADA: New Consensus Statement on Inpatient Glycemic Control (2015)  Target Ranges:  Prepandial:   less than 140 mg/dL      Peak postprandial:   less than 180 mg/dL (1-2 hours)      Critically ill patients:  140 - 180 mg/dL   Lab Results  Component Value Date   GLUCAP 207 (H) 01/27/2023   HGBA1C 7.9 (H) 01/25/2023    Review of Glycemic Control   Latest Reference Range & Units 01/26/23 08:22 01/26/23 11:55 01/26/23 15:53 01/26/23 20:29 01/26/23 23:51 01/27/23 05:01 01/27/23 07:49 01/27/23 11:39  Glucose-Capillary 70 - 99 mg/dL 131 (H) 294 (H) 233 (H) 236 (H) 312 (H) 101 (H) 104 (H) 207 (H)   Diabetes history: DM 2 Outpatient Diabetes medications: Metformin 500 mg bid Current orders for Inpatient glycemic control:  Novolog 0-15 units Q4 hours  A1c 8% on 2/13  Inpatient Diabetes Program Recommendations:    -    Add Novolog 3 units at scheduled Tube Feed times      1000, 14:30, 18:30, 22:30.   Thanks,  Tama Headings RN, MSN, BC-ADM Inpatient Diabetes Coordinator Team Pager [phone] (8a-5p)

## 2023-01-27 NOTE — Progress Notes (Signed)
Patient complained of "needing to pee". Foley catheter check - tubing was dry. Notified Dr. Karleen Hampshire, received orders to irrigate urinary catheter as needed for clogging. Catheter irrigated with 30ml. No clots returned, yellow urine now following through tube.

## 2023-01-27 NOTE — Progress Notes (Signed)
[facility]                                                                               [name] [name], is a 72 y.o. 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  Troponin elevated to 1000.  EKG with ST elevation in lateral leads.  BNP 520.  CXR concerning for bilateral infiltrates. Na 125.  Cardiology consulted.  Patient was started on IV heparin, ceftriaxone and azithromycin.  Patient and family deferred invasive procedures.  TTE ordered.  Cardiology following.   TTE with LVEF of 30 to 35% and possible Takotsubo cardiomyopathy.  Remains on therapeutic dose Lovenox for a total of 72 hours.  Cardiology following.   Hospital course complicated by acute urinary retention and bladder over distention.  He had about 1.8 L urine on I and O cath.  Renal ultrasound concerning for UTI but no hydronephrosis.  Urinalysis with pyuria.  Foley catheter inserted.  Started on IV ceftriaxone and Cardura.  Urine cultures are negative.    Assessment & Plan    Assessment and Plan:  STEMI in the setting of coronary artery disease On admission troponins elevated at 1054  and pt admitted with sob.  EKG showing ST elevation in the lateral leads. Patient and family not interested in invasive intervention Echocardiogram showed left ventricle ejection fraction of 30 to 35% Cardiology on board and recommends medical management with aspirin, Plavix, beta-blocker and statin and patient completed 3 days of Lovenox.   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 Continue with strict intake and output and daily weights.     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 Cultures negative till date   Essential hypertension continue with metoprolol.   Acquired hypothyroidism Continue with Synthroid.   Uncontrolled type 2 diabetes with hyperglycemia Continue with sliding scale and Semglee 10 units daily   Urinary retention/mild UTI Foley catheter placement. Some hematuria today possibly from clots.  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 Continue with riluzole and bolus tube feedings per her nutritionist   Moderate malnutrition Possibly from ALS, dysphagia Nutritionist on board   RN Pressure Injury Documentation:    Malnutrition Type:  Nutrition Problem: Moderate Malnutrition Etiology: chronic illness (ALS, dysphagia now s/p G-tube placement)   Malnutrition Characteristics:  Signs/Symptoms: mild fat depletion, moderate fat depletion, mild muscle depletion, moderate muscle depletion   Nutrition Interventions:  Interventions: Refer to RD  note for recommendations  Estimated body mass index is 28 kg/m as calculated from the following:   Height as of this encounter: 6\' 4"  (1.93 m).   Weight as of this encounter: 104.3 kg.  Code Status: DNR DVT Prophylaxis:  SCDs Start: 01/24/23 1738   Level of Care: Level of care: Progressive Family Communication: discussed with patient's son at bedside.   Disposition Plan:     Remains inpatient appropriate:  hematuria.   Procedures:  None.   Consultants:   Cardiology.   Antimicrobials:   Anti-infectives (From admission, onward)    Start     Dose/Rate  Route Frequency Ordered Stop   01/25/23 1000  cefTRIAXone (ROCEPHIN) 2 g in sodium chloride 0.9 % 100 mL IVPB        2 g 200 mL/hr over 30 Minutes Intravenous Every 24 hours 01/24/23 1750 01/28/23 2359   01/24/23 1400  cefTRIAXone (ROCEPHIN) 1 g in sodium chloride 0.9 % 100 mL IVPB        1 g 200 mL/hr over 30 Minutes Intravenous  Once 01/24/23 1354 01/24/23 1600   01/24/23 1400  azithromycin (ZITHROMAX) 500 mg in sodium chloride 0.9 % 250 mL IVPB        500 mg 250 mL/hr over 60 Minutes Intravenous  Once 01/24/23 1354 01/24/23 1815        Medications  Scheduled Meds:  aspirin  81 mg Per Tube Daily   atorvastatin  80 mg Per Tube Daily   Chlorhexidine Gluconate Cloth  6 each Topical Q0600   clopidogrel  75 mg Per Tube Daily   doxazosin  1 mg Per Tube Daily   feeding supplement (JEVITY 1.5 CAL/FIBER)  237 mL Per Tube Q24H   feeding supplement (JEVITY 1.5 CAL/FIBER)  474 mL Per Tube 3 times per day   free water  250 mL Per Tube 4 times per day   furosemide  20 mg Oral Daily   glycopyrrolate  2 mg Per Tube TID   insulin aspart  0-15 Units Subcutaneous Q4H   insulin glargine-yfgn  10 Units Subcutaneous Daily   levothyroxine  88 mcg Per Tube QAC breakfast   melatonin  1.5 mg Per Tube QHS   metoprolol tartrate  25 mg Per Tube BID   polyethylene glycol  17 g Per Tube BID   riluzole  50 mg Oral Q12H   senna-docusate  2 tablet Oral BID   Continuous Infusions:  sodium chloride Stopped (01/27/23 1630)   cefTRIAXone (ROCEPHIN)  IV Stopped (01/27/23 1205)   PRN Meds:.sodium chloride, fentaNYL (SUBLIMAZE) injection, guaiFENesin-dextromethorphan, metoprolol tartrate, ondansetron **OR** ondansetron (ZOFRAN) IV, traZODone    Subjective:   [name] [name] was seen and examined today.  No new complaints.   Objective:   Vitals:   01/27/23 0453 01/27/23 0752 01/27/23 1140 01/27/23 1534  BP: 120/71 121/69 (!) 114/51 128/65  Pulse: 67 68 78 69  Resp: 18 18 18 18   Temp: (!) 97.4 F (36.3 C)  97.6 F (36.4 C) 98 F (36.7 C) 97.9 F (36.6 C)  TempSrc: Axillary Axillary Axillary Oral  SpO2: 99% 99% 98% 96%  Weight:      Height:        Intake/Output Summary (Last 24 hours) at 01/27/2023 1709 Last data filed at 01/27/2023 1647 Gross per 24 hour  Intake 2177.39 ml  Output 2830 ml  Net -652.61 ml   Filed Weights   01/24/23 1337  Weight: 104.3 kg     Exam General: Alert and oriented  x 3, NAD Cardiovascular: S1 S2 auscultated, no murmurs, RRR Respiratory: Clear to auscultation bilaterally, no wheezing, rales or rhonchi Gastrointestinal: Soft, nontender, nondistended, + bowel sounds Ext: no pedal edema bilaterally Neuro: AAOx3, Cr N's II- XII. Strength 5/5 upper and lower extremities bilaterally Skin: No rashes Psych: Normal affect and demeanor, alert and oriented x3    Data Reviewed:  I have personally reviewed following labs and imaging studies   CBC Lab Results  Component Value Date   WBC 8.8 01/27/2023   RBC 3.74 (L) 01/27/2023   HGB 12.2 (L) 01/27/2023   HCT 34.7 (L) 01/27/2023   MCV 92.8 01/27/2023   MCH 32.6 01/27/2023   PLT 183 01/27/2023   MCHC 35.2 01/27/2023   RDW 12.5 01/27/2023   LYMPHSABS 1.6 01/24/2023   MONOABS 0.5 01/24/2023   EOSABS 0.1 01/24/2023   BASOSABS 0.0 [id number]     Last metabolic panel Lab Results  Component Value Date   NA 128 (L) 01/27/2023   K 4.0 01/27/2023   CL 89 (L) 01/27/2023   CO2 27 01/27/2023   BUN 26 (H) 01/27/2023   CREATININE 0.76 01/27/2023   GLUCOSE 208 (H) 01/27/2023   GFRNONAA >60 01/27/2023   CALCIUM 8.7 (L) 01/27/2023   PHOS 3.5 01/27/2023   PROT 5.3 (L) 01/27/2023   ALBUMIN 2.8 (L) 01/27/2023   BILITOT 0.7 01/27/2023   ALKPHOS 75 01/27/2023   AST 38 01/27/2023   ALT 68 (H) 01/27/2023   ANIONGAP 12 01/27/2023    CBG (last 3)  Recent Labs    01/27/23 0749 01/27/23 1139 01/27/23 1533  GLUCAP 104* 207* 324*      Coagulation Profile: No results for input(s): "INR", "PROTIME" in the  last 168 hours.   Radiology Studies: DG Abd Portable 1V  Result Date: 01/25/2023 CLINICAL DATA:  Abdominal pain. EXAM: PORTABLE ABDOMEN - 1 VIEW COMPARISON:  None Available. FINDINGS: The bowel gas pattern is normal without gaseous distention. No radio-opaque calculi or other significant radiographic abnormality are seen. Increased stool noted consistent with constipation. IMPRESSION: Constipation. Electronically Signed   By: Sammie Bench M.D.   On: 01/25/2023 17:36       Hosie Poisson M.D. [facility] 01/27/2023, 5:09 PM  Available via Epic secure chat 7am-7pm After 7 pm, please refer to night coverage provider listed on amion.

## 2023-01-27 NOTE — Care Management Important Message (Signed)
Important Message  Patient Details  Name: [name] [name] MRN: [id number] Date of Birth: 15-May-1951   Medicare Important Message Given:  Yes     [name] 01/27/2023, 7:52 AM

## 2023-01-28 DIAGNOSIS — J159 Unspecified bacterial pneumonia: Secondary | ICD-10-CM | POA: Diagnosis not present

## 2023-01-28 DIAGNOSIS — G1221 Amyotrophic lateral sclerosis: Secondary | ICD-10-CM | POA: Diagnosis not present

## 2023-01-28 DIAGNOSIS — I213 ST elevation (STEMI) myocardial infarction of unspecified site: Secondary | ICD-10-CM | POA: Diagnosis not present

## 2023-01-28 DIAGNOSIS — I10 Essential (primary) hypertension: Secondary | ICD-10-CM | POA: Diagnosis not present

## 2023-01-28 LAB — GLUCOSE, CAPILLARY
Glucose-Capillary: 117 mg/dL — ABNORMAL HIGH (ref 70–99)
Glucose-Capillary: 229 mg/dL — ABNORMAL HIGH (ref 70–99)
Glucose-Capillary: 241 mg/dL — ABNORMAL HIGH (ref 70–99)
Glucose-Capillary: 248 mg/dL — ABNORMAL HIGH (ref 70–99)
Glucose-Capillary: 252 mg/dL — ABNORMAL HIGH (ref 70–99)
Glucose-Capillary: 282 mg/dL — ABNORMAL HIGH (ref 70–99)
Glucose-Capillary: 306 mg/dL — ABNORMAL HIGH (ref 70–99)

## 2023-01-28 LAB — BASIC METABOLIC PANEL
Anion gap: 10 (ref 5–15)
BUN: 22 mg/dL (ref 8–23)
CO2: 29 mmol/L (ref 22–32)
Calcium: 8.8 mg/dL — ABNORMAL LOW (ref 8.9–10.3)
Chloride: 91 mmol/L — ABNORMAL LOW (ref 98–111)
Creatinine, Ser: 0.76 mg/dL (ref 0.61–1.24)
GFR, Estimated: >60 mL/min (ref >60)
Glucose, Bld: 113 mg/dL — ABNORMAL HIGH (ref 70–99)
Potassium: 4 mmol/L (ref 3.5–5.1)
Sodium: 130 mmol/L — ABNORMAL LOW (ref 135–145)

## 2023-01-28 MED ORDER — GUAIFENESIN-DM 100-10 MG/5ML PO SYRP: 5.0000 mL | ORAL_SOLUTION | ORAL | 0 refills | Status: DC | PRN

## 2023-01-28 MED ORDER — ACETAMINOPHEN 325 MG PO TABS
650.0000 mg | ORAL_TABLET | ORAL | Status: DC | PRN
  Administered 2023-01-28: 650 mg via ORAL
  Filled 2023-01-28: qty 2

## 2023-01-28 MED ORDER — FREE WATER: 250.0000 mL | Freq: Four times a day (QID) | Status: DC

## 2023-01-28 MED ORDER — JEVITY 1.5 CAL/FIBER PO LIQD: 474.0000 mL | Freq: Three times a day (TID) | ORAL | 2 refills | Status: DC

## 2023-01-28 MED ORDER — BISACODYL 10 MG RE SUPP
10.0000 mg | Freq: Once | RECTAL | Status: AC
  Administered 2023-01-28: 10 mg via RECTAL
  Filled 2023-01-28: qty 1

## 2023-01-28 MED ORDER — JEVITY 1.5 CAL/FIBER PO LIQD: 237.0000 mL | ORAL | 2 refills | Status: DC

## 2023-01-28 MED ORDER — POLYETHYLENE GLYCOL 3350 17 G PO PACK: 17.0000 g | PACK | Freq: Every day | ORAL | 0 refills | Status: DC | PRN

## 2023-01-28 MED ORDER — FUROSEMIDE 20 MG PO TABS: 20.0000 mg | ORAL_TABLET | Freq: Every day | ORAL | 1 refills | Status: DC

## 2023-01-28 MED ORDER — SENNOSIDES-DOCUSATE SODIUM 8.6-50 MG PO TABS: 2.0000 | ORAL_TABLET | Freq: Every evening | ORAL | Status: DC | PRN

## 2023-01-28 MED ORDER — CLOPIDOGREL BISULFATE 75 MG PO TABS: 75.0000 mg | ORAL_TABLET | Freq: Every day | ORAL | 2 refills | Status: DC

## 2023-01-28 MED ORDER — DOXAZOSIN MESYLATE 1 MG PO TABS: 1.0000 mg | ORAL_TABLET | Freq: Every day | ORAL | 1 refills | Status: DC

## 2023-01-28 MED ORDER — INSULIN ASPART 100 UNIT/ML IJ SOLN
3.0000 [IU] | Freq: Three times a day (TID) | INTRAMUSCULAR | Status: DC
  Administered 2023-01-28 – 2023-01-29 (×3): 3 [IU] via SUBCUTANEOUS

## 2023-01-28 NOTE — Progress Notes (Signed)
PT Cancellation Note  Patient Details Name: BENETT EUSTACE MRN: [id number] DOB: 1951/10/01   Cancelled Treatment:    Reason Eval/Treat Not Completed: Other (comment). Spoke with patient and wife. Until last Thursday pt was standing with walker to get from lift chair/recliner to power w/c. He weakness has progressed and he has been unable to stand. Wife reports they have mechanical lift at home. Have had difficulty using mechanical lift to get into power chair due to height of power w/c. Recommend HHPT to assist pt/family with adaptations as pt's mobility declines. Family providing excellent care at home. Pt with needed equipment at home.  Deferred mobility due to pt fatigue and coughing.    Shary Decamp [facility] 01/28/2023, 3:26 PM [place] Office [phone]

## 2023-01-28 NOTE — Evaluation (Signed)
Occupational Therapy Evaluation Patient Details Name: [name] [name] MRN: [id number] DOB: 1951/03/15 Today's Date: 01/28/2023   History of Present Illness Pt is a 72 y/o M presenting to ED on with progressive SOB, admitted for STEMI, possible PNA, hyponatremia, troponin 1000. CXR concerning for bilateral infiltrates. 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 Pt 's spouse assists with all ADLs, however pt is able to perform self-suctioning and don Trilogy mask with set up A. Pt's son present during session and assists with providing PLOF and home set up. Pt with L > R weakness in BUE/BLE, needing mod-total A for ADLs, and total A +2 for bed mobility, pt deferred sitting EOB/OOB at time of evaluation. Son reports they are looking into therapy services/caregivers for home, discussed with case mgmt. Pt presenting with impairments listed below, will follow acutely. Recommend HHOT/HH aide at d/c, if spouse and or caregiver unable to provide level of assist needed, pt may need postacute rehab.     Recommendations for follow up therapy are one component of a multi-disciplinary discharge planning process, led by the attending physician.  Recommendations may be updated based on patient status, additional functional criteria and insurance authorization.   Assistance Recommended at Discharge Frequent or constant Supervision/Assistance  Patient can return home with the following Two people to help with walking and/or transfers;Two people to help with bathing/dressing/bathroom;Assistance with cooking/housework;Assistance with feeding;Direct supervision/assist for medications management;Direct supervision/assist for financial management;Help with stairs or ramp for entrance;Assist for transportation     Functional Status Assessment  Patient has had a recent decline in their functional status and demonstrates the ability to make significant improvements in function in a reasonable and predictable amount of time.  Equipment Recommendations  None recommended by OT (pt has necessary DME)    Recommendations for Other Services       Precautions / Restrictions Precautions Precautions: Fall Precaution Comments: ALS, tube feeds, trilogy mask, suction Restrictions Weight Bearing Restrictions: No      Mobility Bed Mobility Overal bed mobility: Needs Assistance             General bed mobility comments: total A +2 to scoot up in bed    Transfers                   General transfer comment: deferred, pt declines      Balance Overall balance assessment: Needs assistance   Sitting balance-Leahy Scale: Zero Sitting balance - Comments: cannot pull self up into long sitting, per pt report needs trunk and head support in sitting       Standing balance comment: deferred                           ADL either performed or assessed with clinical judgement   ADL Overall ADL's : Needs assistance/impaired Eating/Feeding: Total assistance Eating/Feeding Details (indicate cue type and reason): tube feeds Grooming: Moderate assistance Grooming Details (indicate cue type and reason): able to self-suction, assist for placing trilogy mask Upper Body Bathing: Maximal assistance   Lower Body Bathing: Total assistance   Upper Body Dressing : Maximal assistance   Lower Body Dressing: Total assistance   Toilet Transfer: Total assistance;+2 for physical assistance   Toileting- Clothing Manipulation and Hygiene: Total assistance  Toileting - Clothing Manipulation Details (indicate cue type and reason): catheter     Functional mobility during ADLs: Maximal assistance;Total assistance;+2 for physical assistance       Vision Baseline Vision/History: 1 Wears  glasses Vision Assessment?: No apparent visual deficits     Perception Perception Perception Tested?: No   Praxis Praxis Praxis tested?: Not tested    Pertinent Vitals/Pain       Hand Dominance     Extremity/Trunk Assessment Upper Extremity Assessment Upper Extremity Assessment: Generalized weakness (LUE > RUE weakness, able to grasp phone and bedrail but unable to pull self up)   Lower Extremity Assessment Lower Extremity Assessment: Defer to PT evaluation (LLE > RLE weakness)       Communication Communication Communication: Other (comment) (nonverbal, baseline; typically gestures)   Cognition Arousal/Alertness: Awake/alert Behavior During Therapy: WFL for tasks assessed/performed Overall Cognitive Status: Within Functional Limits for tasks assessed                                 General Comments: WFL, communicating via gestures and typing on phone     General Comments  VSS; son present and supprotive    Exercises     Shoulder Instructions      Home Living Family/patient expects to be discharged to:: Private residence Living Arrangements: Spouse/significant other Available Help at Discharge: Family;Available 24 hours/day Type of Home: House Home Access: Ramped entrance     Home Layout: One level;Able to live on main level with bedroom/bathroom               Home Equipment: Wheelchair - Engineer, manufacturing systems (2 wheels);Hospital bed;Other (comment) (hoyer lift and pad)          Prior Functioning/Environment Prior Level of Function : Needs assist             Mobility Comments: was able to transfer to power chair ~1 week ago, but has become progressively difficutly has been in bed since thursday ADLs Comments: can self suction and place mask on with set up A, did ALS clinic every 3 months        OT Problem List: Decreased strength;Decreased range of motion;Decreased activity tolerance;Impaired balance (sitting and/or  standing);Decreased coordination;Cardiopulmonary status limiting activity;Impaired UE functional use      OT Treatment/Interventions: Self-care/ADL training;Therapeutic exercise;Energy conservation;DME and/or AE instruction;Therapeutic activities;Patient/family education;Balance training    OT Goals(Current goals can be found in the care plan section) Acute Rehab OT Goals Patient Stated Goal: none stated OT Goal Formulation: With patient Time For Goal Achievement: 02/11/23 Potential to Achieve Goals: Fair ADL Goals Pt Will Perform Eating: with caregiver independent in assisting;bed level;sitting Pt Will Perform Grooming: with caregiver independent in assisting;sitting;bed level Pt Will Perform Upper Body Dressing: with caregiver independent in assisting;sitting;bed level Pt Will Perform Lower Body Dressing: with caregiver independent in assisting;bed level;sitting/lateral leans Pt Will Transfer to Toilet: with mod assist;squat pivot transfer;stand pivot transfer;bedside commode Pt/caregiver will Perform Home Exercise Program: Increased ROM;Increased strength;Both right and left upper extremity;With written HEP provided;With Supervision  OT Frequency: Min 1X/week    Co-evaluation              AM-PAC OT "6 Clicks" Daily Activity     Outcome Measure Help from another person eating meals?: Total Help from another person taking care of personal grooming?: A Lot Help from another person toileting, which includes using toliet, bedpan, or urinal?: Total Help from another person  bathing (including washing, rinsing, drying)?: A Lot Help from another person to put on and taking off regular upper body clothing?: A Lot Help from another person to put on and taking off regular lower body clothing?: Total 6 Click Score: 9   End of Session Nurse Communication: Mobility status  Activity Tolerance: Patient tolerated treatment well Patient left: in bed;with call bell/phone within reach;with bed  alarm set  OT Visit Diagnosis: Unsteadiness on feet (R26.81);Other abnormalities of gait and mobility (R26.89);Muscle weakness (generalized) (M62.81);Feeding difficulties (R63.3)                Time: 1110-1140 OT Time Calculation (min): 30 min Charges:  OT General Charges $OT Visit: 1 Visit OT Evaluation $OT Eval Moderate Complexity: 1 Mod OT Treatments $Therapeutic Activity: 8-22 mins  Renaye Rakers, OTD, OTR/L SecureChat Preferred Acute Rehab [phone]  Renaye Rakers [place] 01/28/2023, 12:55 PM

## 2023-01-28 NOTE — TOC Initial Note (Signed)
Transition of Care [facility]) - Initial/Assessment Note    Patient Details  Name: [name] [name] MRN: [id number] Date of Birth: 03/18/1951  [facility] of [facility]) CM/SW Contact:    Bethena Roys, RN Phone Number: 01/28/2023, 3:31 PM  Clinical Narrative: Patient presented for shortness of breath. PTA patient was from home with support of spouse and son. Patient has ALS-has DME trilogy at the bedside, suction, power chair, and a lift. Case Manager was able to speak with spouse and son today regarding disposition needs. Plan will be for home with home health services- Medicare.gov list provided and family is agreeable to [facility] or [facility]. Case Manager called [facility] and they can service the patient. [facility] is trying to staff by Sat for PT,RN,Aide services. Case Manager did speak with family regarding personal care agencies for aide assistance in the home for at least 2-4-8 hours at time if needed. Family is aware that they will have to call the agencies to arrange. Case Manager discussed transport home: patient will transport via ambulance. Son needed to get bed in the room prior to arrival. Plan will be for transition home 01-29-23. Patient will d/c with foley catheter and MD states he will have follow up with urology outpatient. Staff RN to provide education regarding foley catheter care for home. Case Manager will continue to follow for additional needs.                    Expected Discharge Plan: [place] Barriers to Discharge: No Barriers Identified   Patient Goals and CMS Choice Patient states their goals for this hospitalization and ongoing recovery are:: patient wants to return home. CMS Medicare.gov Compare Post Acute Care list provided to:: Patient Choice offered to / list presented to : Spouse, Patient      Expected Discharge Plan and Services In-house Referral: NA Discharge Planning Services: CM Consult Post Acute Care Choice: Home Health, Resumption of  Svcs/PTA Provider Living arrangements for the past 2 months: Single Family Home Expected Discharge Date: 01/28/23               DME Arranged: N/A DME Agency: NA       HH Arranged: RN, Disease Management, PT, Nurse's Aide [place] Agency: [place] Date [place]: 01/28/23 Time HH Agency Contacted: [phone] Representative spoke with at [place]: Tommi Rumps  Prior Living Arrangements/Services Living arrangements for the past 2 months: [place] with:: Spouse Patient language and need for interpreter reviewed:: Yes Do you feel safe going back to the place where you live?: Yes      Need for Family Participation in Patient Care: Yes (Comment) Care giver support system in place?: Yes (comment) Current home services: DME (Bipap, power chair, lift, suction,) Criminal Activity/Legal Involvement Pertinent to Current Situation/Hospitalization: No - Comment as needed  Activities of Daily Living      Permission Sought/Granted Permission sought to share information with : Family Supports, Customer service manager, Case Optician, dispensing granted to share information with : Yes, Verbal Permission Granted     Permission granted to share info w AGENCY: [facility]        Emotional Assessment Appearance:: Appears stated age Attitude/Demeanor/Rapport: Unable to Assess Affect (typically observed): Unable to Assess Orientation: : Oriented to Self, Oriented to Place, Oriented to  Time Alcohol / Substance Use: Not Applicable Psych Involvement: No (comment)  Admission diagnosis:  SOB (shortness of breath) [R06.02] ALS (amyotrophic lateral sclerosis) [G12.21] ACS (acute  coronary syndrome) [I24.9] STEMI (ST elevation myocardial infarction) [I21.3] CAP (community acquired pneumonia) [J18.9] Patient Active Problem List   Diagnosis Date Noted   ACS (acute coronary syndrome) 01/27/2023   Acute urinary retention 01/26/2023   Urinary tract infection 01/26/2023   Acute  HFrEF (heart failure with reduced ejection fraction) 01/26/2023   Malnutrition of moderate degree 01/25/2023   Bacterial pneumonia 01/25/2023   Hyponatremia 01/25/2023   Acute ST elevation myocardial infarction (STEMI) of anterolateral wall 01/25/2023   STEMI (ST elevation myocardial infarction) 01/24/2023   CAP (community acquired pneumonia) 01/24/2023   S/P percutaneous endoscopic gastrostomy (PEG) tube placement 05/14/2022   ALS (amyotrophic lateral sclerosis) 12/01/2021   Gastroesophageal reflux disease 10/08/2021   Esophageal erosions 10/08/2021   Asymptomatic PVCs 06/02/2021   Essential hypertension 06/02/2021   Coronary artery disease involving native coronary artery of native heart without angina pectoris 06/02/2021   Atherosclerosis of aorta 06/02/2021   Chronic obstructive pulmonary disease 02/18/2021   Allergic rhinitis 02/06/2020   Glaucoma suspect 10/02/2019   PCP NOTES >>> 08/07/2015   Hypothyroidism 05/01/2015   Neuroma of foot 03/29/2015   Status post right foot surgery 01/22/2015   Plantar fasciitis of right foot 12/03/2014   Metatarsal deformity 12/03/2014   Equinus deformity of foot, acquired 12/03/2014   Annual physical exam 08/04/2012   Erectile dysfunction 08/04/2012   Type 2 diabetes mellitus with microalbuminuria, without long-term current use of insulin 05/04/2012   Mixed hyperlipidemia 05/04/2012   DJD (degenerative joint disease) 05/04/2012   Bradycardia 05/04/2012   PCP:  Colon Branch, MD Pharmacy:   [facility] [place], [place] [address] [place] [address] Phone: [phone] Fax: [place] [place] [address] Phone: [phone] Fax: [place], [place] [address] [place]  [address] Phone: [phone] Fax: [phone]  Social Determinants of Health (SDOH) Social History: SDOH Screenings   Food Insecurity: No Food Insecurity (02/19/2022)  Housing: Low Risk  (02/13/2021)  Transportation Needs: No Transportation Needs (02/19/2022)  Alcohol Screen: Low Risk  (02/19/2022)  Depression (PHQ2-9): Low Risk  (12/08/2022)  Financial Resource Strain: Low Risk  (02/19/2022)  Physical Activity: Insufficiently Active (02/13/2021)  Social Connections: Moderately Isolated (02/19/2022)  Stress: No Stress Concern Present (02/19/2022)  Tobacco Use: Medium Risk (12/08/2022)   Readmission Risk Interventions     No data to display

## 2023-01-28 NOTE — Progress Notes (Signed)
[facility]                                                                               [name] [name], is a 72 y.o. 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  Troponin elevated to 1000.  EKG with ST elevation in lateral leads.  BNP 520.  CXR concerning for bilateral infiltrates. Na 125.  Cardiology consulted.  Patient was started on IV heparin, ceftriaxone and azithromycin.  Patient and family deferred invasive procedures.  TTE ordered.  Cardiology following.   TTE with LVEF of 30 to 35% and possible Takotsubo cardiomyopathy.  Remains on therapeutic dose Lovenox for a total of 72 hours.  Cardiology following.   Hospital course complicated by acute urinary retention and bladder over distention.  He had about 1.8 L urine on I and O cath.  Renal ultrasound concerning for UTI but no hydronephrosis.  Urinalysis with pyuria.  Foley catheter inserted.  Started on IV ceftriaxone and Cardura.  Urine cultures are negative.    Assessment & Plan    Assessment and Plan:  STEMI in the setting of coronary artery disease On admission troponins elevated at 1054  and pt admitted with sob.  EKG showing ST elevation in the lateral leads. Patient and family not interested in invasive intervention Echocardiogram showed left ventricle ejection fraction of 30 to 35% Cardiology on board and recommends medical management with aspirin, Plavix, beta-blocker and statin and patient completed 3 days of Lovenox.   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 Continue with strict intake and output and daily weights.     Bibasilar pneumonia Completed 5 days of IV rocephin.  Cultures negative till date   Essential hypertension  Well controlled.  continue with metoprolol.   Acquired hypothyroidism Continue with Synthroid.   Uncontrolled type 2 diabetes with hyperglycemia  CBG (last 3)  Recent Labs    01/28/23 0344 01/28/23 0735 01/28/23 1210  GLUCAP 229* 117* 306*   Resume SSI.    Urinary retention/mild UTI Foley catheter placement. Some hematuria today possibly from clots.  Irrigated and urine has cleared up Urine culture are negative to date Recommend keeping the Foley in for another week for bladder rest outpatient follow-up with urology for voiding trial.  Continue with Cardura for possible BPH   S/p PEG tube placement    Hyponatremia Sodium improved to 130.  Continue to monitor    ALS Patient is trilogy and PEG dependent Continue with riluzole and bolus tube feedings per her nutritionist   Moderate malnutrition Possibly from ALS, dysphagia Nutritionist on board   RN Pressure Injury Documentation:    Malnutrition Type:  Nutrition Problem: Moderate Malnutrition Etiology: chronic illness (ALS, dysphagia now s/p  G-tube placement)   Malnutrition Characteristics:  Signs/Symptoms: mild fat depletion, moderate fat depletion, mild muscle depletion, moderate muscle depletion   Nutrition Interventions:  Interventions: Refer to RD note for recommendations  Estimated body mass index is 28 kg/m as calculated from the following:   Height as of this encounter: 6\' 4"  (1.93 m).   Weight as of this encounter: 104.3 kg.  Code Status: DNR DVT Prophylaxis:  SCDs Start: 01/24/23 1738   Level of Care: Level of care: Progressive Family Communication: discussed with patient's son at bedside.   Disposition Plan:     Remains inpatient appropriate:  home health, and hospital bed to be  arranged.   Procedures:  None.   Consultants:   Cardiology.   Antimicrobials:   Anti-infectives (From admission, onward)    Start     Dose/Rate Route Frequency Ordered Stop   01/25/23 1000  cefTRIAXone (ROCEPHIN) 2 g in sodium chloride 0.9 % 100 mL IVPB        2 g 200 mL/hr over 30 Minutes Intravenous Every 24 hours 01/24/23 1750 01/28/23 0950   01/24/23 1400  cefTRIAXone (ROCEPHIN) 1 g in sodium chloride 0.9 % 100 mL IVPB        1 g 200 mL/hr over 30 Minutes Intravenous  Once 01/24/23 1354 01/24/23 1600   01/24/23 1400  azithromycin (ZITHROMAX) 500 mg in sodium chloride 0.9 % 250 mL IVPB        500 mg 250 mL/hr over 60 Minutes Intravenous  Once 01/24/23 1354 01/24/23 1815        Medications  Scheduled Meds:  aspirin  81 mg Per Tube Daily   atorvastatin  80 mg Per Tube Daily   Chlorhexidine Gluconate Cloth  6 each Topical Q0600   clopidogrel  75 mg Per Tube Daily   doxazosin  1 mg Per Tube Daily   feeding supplement (JEVITY 1.5 CAL/FIBER)  237 mL Per Tube Q24H   feeding supplement (JEVITY 1.5 CAL/FIBER)  474 mL Per Tube 3 times per day   free water  250 mL Per Tube 4 times per day   furosemide  20 mg Oral Daily   glycopyrrolate  2 mg Per Tube TID   insulin aspart  0-15 Units Subcutaneous Q4H   insulin aspart  3 Units Subcutaneous TID WC   insulin glargine-yfgn  10 Units Subcutaneous Daily   levothyroxine  88 mcg Per Tube QAC breakfast   melatonin  1.5 mg Per Tube QHS   metoprolol tartrate  25 mg Per Tube BID   polyethylene glycol  17 g Per Tube BID   riluzole  50 mg Oral Q12H   senna-docusate  2 tablet Oral BID   Continuous Infusions:  sodium chloride Stopped (01/27/23 1630)   PRN Meds:.sodium chloride, fentaNYL (SUBLIMAZE) injection, guaiFENesin-dextromethorphan, metoprolol tartrate, ondansetron **OR** ondansetron (ZOFRAN) IV, traZODone    Subjective:   [name] [name] was seen and examined today.  Cough has improved, no new complaints.   Objective:    Vitals:   01/28/23 0027 01/28/23 0405 01/28/23 0729 01/28/23 1212  BP: 114/61 127/67 122/67 119/67  Pulse: 72 67 61 66  Resp: 18 18 18 18   Temp: 98 F (36.7 C) 98 F (36.7 C) 97.7 F (36.5 C) 97.7 F (36.5 C)  TempSrc: Oral Oral Axillary Axillary  SpO2: 99% 97% 97% 96%  Weight:      Height:        Intake/Output Summary (Last 24 hours) at 01/28/2023 1449 Last data filed at 01/28/2023  1100 Gross per 24 hour  Intake 2420.39 ml  Output 3880 ml  Net -1459.61 ml    Filed Weights   01/24/23 1337  Weight: 104.3 kg     Exam General exam: Appears calm and comfortable  Respiratory system: Clear to auscultation. Respiratory effort normal. Cardiovascular system: S1 & S2 heard, RRR. No JVD,  Gastrointestinal system: Abdomen is nondistended, soft and nontender.  Central nervous system: Alert and oriented. No focal neurological deficits. Extremities: Symmetric 5 x 5 power. Skin: No rashes, Psychiatry:  Mood & affect appropriate.     Data Reviewed:  I have personally reviewed following labs and imaging studies   CBC Lab Results  Component Value Date   WBC 8.8 01/27/2023   RBC 3.74 (L) 01/27/2023   HGB 12.2 (L) 01/27/2023   HCT 34.7 (L) 01/27/2023   MCV 92.8 01/27/2023   MCH 32.6 01/27/2023   PLT 183 01/27/2023   MCHC 35.2 01/27/2023   RDW 12.5 01/27/2023   LYMPHSABS 1.6 01/24/2023   MONOABS 0.5 01/24/2023   EOSABS 0.1 01/24/2023   BASOSABS 0.0 99991111     Last metabolic panel Lab Results  Component Value Date   NA 130 (L) 01/28/2023   K 4.0 01/28/2023   CL 91 (L) 01/28/2023   CO2 29 01/28/2023   BUN 22 01/28/2023   CREATININE 0.76 01/28/2023   GLUCOSE 113 (H) 01/28/2023   GFRNONAA >60 01/28/2023   CALCIUM 8.8 (L) 01/28/2023   PHOS 3.5 01/27/2023   PROT 5.3 (L) 01/27/2023   ALBUMIN 2.8 (L) 01/27/2023   BILITOT 0.7 01/27/2023   ALKPHOS 75 01/27/2023   AST 38 01/27/2023   ALT 68 (H) 01/27/2023   ANIONGAP 10 01/28/2023    CBG (last 3)  Recent Labs     01/28/23 0344 01/28/23 0735 01/28/23 1210  GLUCAP 229* 117* 306*       Coagulation Profile: No results for input(s): "INR", "PROTIME" in the last 168 hours.   Radiology Studies: No results found.     Hosie Poisson M.D. [facility] 01/28/2023, 2:49 PM  Available via Epic secure chat 7am-7pm After 7 pm, please refer to night coverage provider listed on amion.

## 2023-01-29 LAB — CULTURE, BLOOD (ROUTINE X 2)
Culture: NO GROWTH
Culture: NO GROWTH
Special Requests: ADEQUATE
Special Requests: ADEQUATE

## 2023-01-29 LAB — LEGIONELLA PNEUMOPHILA SEROGP 1 UR AG: L. pneumophila Serogp 1 Ur Ag: NEGATIVE

## 2023-01-29 LAB — GLUCOSE, CAPILLARY
Glucose-Capillary: 188 mg/dL — ABNORMAL HIGH (ref 70–99)
Glucose-Capillary: 129 mg/dL — ABNORMAL HIGH (ref 70–99)

## 2023-01-29 MED ORDER — TRAZODONE HCL 100 MG PO TABS: 100.0000 mg | ORAL_TABLET | Freq: Every evening | ORAL | 1 refills | Status: DC | PRN

## 2023-01-29 NOTE — Plan of Care (Signed)

## 2023-01-29 NOTE — Discharge Summary (Signed)
Physician Discharge Summary   Patient: [name] [name] MRN: [id number] DOB: January 06, 1951  Admit date:     01/24/2023  Discharge date: {dischdate:26783}  Discharge Physician: Kathlen Mody   PCP: Wanda Plump, MD   Recommendations at discharge:  {Tip this will not be part of the note when signed- Example include specific recommendations for outpatient follow-up, pending tests to follow-up on. (Optional):26781}  ***  Discharge Diagnoses: Principal Problem:   STEMI (ST elevation myocardial infarction) Active Problems:   Mixed hyperlipidemia   DJD (degenerative joint disease)   Hypothyroidism   Chronic obstructive pulmonary disease   Essential hypertension   Coronary artery disease involving native coronary artery of native heart without angina pectoris   ALS (amyotrophic lateral sclerosis)   CAP (community acquired pneumonia)   Malnutrition of moderate degree   Bacterial pneumonia   Hyponatremia   Acute ST elevation myocardial infarction (STEMI) of anterolateral wall   Acute urinary retention   Urinary tract infection   Acute HFrEF (heart failure with reduced ejection fraction)   ACS (acute coronary syndrome)  Resolved Problems:   Acute on chronic respiratory failure with hypoxia  Hospital Course: No notes on file  Assessment and Plan: No notes have been filed under this hospital service. Service: Hospitalist     {Tip this will not be part of the note when signed Body mass index is 28 kg/m. ,  Nutrition Documentation    Flowsheet Row ED to Hosp-Admission (Discharged) from 01/24/2023 in [place] [facility]  Nutrition Problem Moderate Malnutrition  Etiology chronic illness  [ALS, dysphagia now s/p G-tube placement]  Nutrition Goal Patient will meet greater than or equal to 90% of their needs  Interventions Refer to RD note for recommendations     ,  (Optional):26781}  {(NOTE) Pain control PDMP Statment (Optional):26782} Consultants: *** Procedures  performed: ***  Disposition: {Plan; Disposition:26390} Diet recommendation:  Discharge Diet Orders (From admission, onward)     Start     Ordered   01/28/23 0000  Diet - low sodium heart healthy        01/28/23 1345           {Diet_Plan:26776} DISCHARGE MEDICATION: Allergies as of 01/29/2023   No Known Allergies      Medication List     STOP taking these medications    fluticasone 50 MCG/ACT nasal spray Commonly known as: FLONASE   multivitamin tablet   mupirocin cream 2 % Commonly known as: BACTROBAN   sildenafil 20 MG tablet Commonly known as: REVATIO       TAKE these medications    Accu-Chek Aviva Plus w/Device Kit Use to check blood sugar   Accu-Chek Softclix Lancets lancets Use as instructed   aspirin 81 MG tablet Place 81 mg into feeding tube daily.   atorvastatin 40 MG tablet Commonly known as: LIPITOR Place 1 tablet (40 mg total) into feeding tube daily.   azelastine 0.1 % nasal spray Commonly known as: ASTELIN Place 2 sprays into both nostrils at bedtime.   clopidogrel 75 MG tablet Commonly known as: PLAVIX Place 1 tablet (75 mg total) into feeding tube daily.   doxazosin 1 MG tablet Commonly known as: CARDURA Place 1 tablet (1 mg total) into feeding tube daily.   feeding supplement (JEVITY 1.5 CAL/FIBER) Liqd Place 237 mLs into feeding tube daily.   feeding supplement (JEVITY 1.5 CAL/FIBER) Liqd Place 474 mLs into feeding tube in the morning, at noon, and at bedtime.   free water Soln Place  250 mLs into feeding tube every 6 (six) hours.   furosemide 20 MG tablet Commonly known as: LASIX Take 1 tablet (20 mg total) by mouth daily.   glucose blood test strip Commonly known as: Accu-Chek Aviva Check blood sugar no more than twice daily   glycopyrrolate 1 MG tablet Commonly known as: ROBINUL Take 2 mg by mouth 3 (three) times daily.   guaiFENesin-dextromethorphan 100-10 MG/5ML syrup Commonly known as: ROBITUSSIN DM Place 5  mLs into feeding tube every 4 (four) hours as needed for cough.   levothyroxine 88 MCG tablet Commonly known as: SYNTHROID Place 1 tablet (88 mcg total) into feeding tube daily before breakfast.   lisinopril 2.5 MG tablet Commonly known as: ZESTRIL Place 1 tablet (2.5 mg total) into feeding tube daily.   melatonin 1 MG Tabs tablet Place 1 mg into feeding tube daily.   Metformin HCl 500 MG/5ML Soln Place 5 mLs (500 mg total) into feeding tube in the morning and at bedtime.   metoprolol tartrate 25 MG tablet Commonly known as: LOPRESSOR Place 1 tablet (25 mg total) into feeding tube 2 (two) times daily.   polyethylene glycol 17 g packet Commonly known as: MIRALAX / GLYCOLAX Place 17 g into feeding tube daily as needed.   riluzole 50 MG tablet Commonly known as: RILUTEK Place 50 mg into feeding tube every 12 (twelve) hours.   senna-docusate 8.6-50 MG tablet Commonly known as: Senokot-S Take 2 tablets by mouth at bedtime as needed for mild constipation.   traZODone 100 MG tablet Commonly known as: DESYREL Take 1 tablet (100 mg total) by mouth at bedtime as needed for sleep.   triamcinolone cream 0.1 % Commonly known as: KENALOG        Follow-up Information     [facility], [facility] Follow [facility].   Specialty: Home Health Services Why: registered nurse- physical therapy-home health aide-office to call with a follow up visit. Contact information: [address] [place] [address] [phone]         Wanda PlumpPaz, Jose E, MD. Schedule an appointment as soon as possible for a visit in 1 week(s).   Specialty: Internal Medicine Contact information: [phone] [place] [address] [phone]         [facility]. Schedule an appointment as soon as possible for a visit in 1 week(s).   Why: FOR URINARY RETENTION, S/P FOLEY CATHETER PLACEMENT, NEEDS VOIDING TRIAL. Contact information: [address] [place] [address] [phone]               Discharge Exam: Ceasar MonsFiled Weights   01/24/23 1337  Weight: 104.3 kg   ***  Condition at discharge: {DC Condition:26389}  The results of significant diagnostics from this hospitalization (including imaging, microbiology, ancillary and laboratory) are listed below for reference.   Imaging Studies: DG Abd Portable 1V  Result Date: 01/25/2023 CLINICAL DATA:  Abdominal pain. EXAM: PORTABLE ABDOMEN - 1 VIEW COMPARISON:  None Available. FINDINGS: The bowel gas pattern is normal without gaseous distention. No radio-opaque calculi or other significant radiographic abnormality are seen. Increased stool noted consistent with constipation. IMPRESSION: Constipation. Electronically Signed   By: Layla MawJoshua  Pleasure M.D.   On: 01/25/2023 17:36   US RENAL  Result Date: 01/25/2023 CLINICAL DATA:  [id number] Acute urinary retention [id number] EXAM: RENAL / URINARY TRACT ULTRASOUND COMPLETE COMPARISON:  None Available. FINDINGS: Right Kidney: Renal measurements: 11.2 x 5.4 x 5.5 cm =  volume: 172.3 mL. Echogenicity within normal limits. No mass or hydronephrosis visualized. Left Kidney: Renal measurements: 12.4 x 5.8 x 5.0 cm = volume: 186.8 mL. Echogenicity within normal limits. No mass or hydronephrosis visualized. Bladder: Circumferential bladder wall thickening. There is confluent echogenic debris layering along the posterior bladder without internal vascularity. Other: Limited exam due to body habitus. IMPRESSION: No hydronephrosis. Circumferential bladder wall thickening with confluent echogenic debris layering along the posterior bladder which could be infectious or potentially blood clot. Correlate with urinalysis. Electronically Signed   By: Caprice Renshaw M.D.   On: 01/25/2023 16:58   ECHOCARDIOGRAM COMPLETE  Result Date: 01/25/2023    ECHOCARDIOGRAM REPORT   Patient Name:   JAVYON FONTAN Date of Exam: 01/25/2023 Medical Rec #:  [id number]    Height:        76.0 in Accession #:    [id number]   Weight:       230.0 lb Date of Birth:  1950-11-11    BSA:          2.351 m Patient Age:    71 years     BP:           125/79 mmHg Patient Gender: M            HR:           78 bpm. Exam Location:  Inpatient Procedure: 2D Echo, Cardiac Doppler, Color Doppler and Intracardiac            Opacification Agent Indications:    CAD Native Vessel I25.10  History:        Patient has no prior history of Echocardiogram examinations. CAD                 and Acute MI, COPD, Arrythmias:Bradycardia; Risk                 Factors:Hypertension, Diabetes and Dyslipidemia.  Sonographer:    Lucendia Herrlich Referring Phys: [id number] OLADAPO ADEFESO IMPRESSIONS  1. Difficult acoustic windows even with Definity use. Akinesis of the distal 1/2 of LV, best appreciated in apical 4 Chamber view. Overall appears consistent with Takotsubo's cardiomyopathy.. Left ventricular ejection fraction, by estimation, is 30 to 35%. The left ventricle has moderately decreased function.  2. Right ventricular systolic function is normal. The right ventricular size is normal.  3. A small pericardial effusion is present.  4. The mitral valve is normal in structure. Trivial mitral valve regurgitation.  5. The aortic valve is grossly normal. Aortic valve regurgitation is not visualized. FINDINGS  Left Ventricle: Difficult acoustic windows even with Definity use. Akinesis of the distal 1/2 of LV, best appreciated in apical 4 Chamber view. Overall appears consistent with Takotsubo's cardiomyopathy. Left ventricular ejection fraction, by estimation, is 30 to 35%. The left ventricle has moderately decreased function. Definity contrast agent was given IV to delineate the left ventricular endocardial borders. The left ventricular internal cavity size was normal in size. Right Ventricle: The right ventricular size is normal. Right vetricular wall thickness was not assessed. Right ventricular systolic function is normal. Left Atrium:  Left atrial size was normal in size. Right Atrium: Right atrial size was normal in size. Pericardium: A small pericardial effusion is present. Mitral Valve: The mitral valve is normal in structure. Trivial mitral valve regurgitation. Tricuspid Valve: The tricuspid valve is normal in structure. Tricuspid valve regurgitation is trivial. Aortic Valve: The aortic valve is grossly normal. Aortic valve regurgitation is not visualized. Aortic valve mean gradient measures 4.0  mmHg. Aortic valve peak gradient measures 7.5 mmHg. Aortic valve area, by VTI measures 2.81 cm. Pulmonic Valve: The pulmonic valve was not well visualized. Pulmonic valve regurgitation is not visualized. No evidence of pulmonic stenosis. Aorta: The aortic root and ascending aorta are structurally normal, with no evidence of dilitation. IAS/Shunts: No atrial level shunt detected by color flow Doppler.  LEFT VENTRICLE PLAX 2D LVOT diam:     2.20 cm      Diastology LV SV:         64           LV e' medial:    5.28 cm/s LV SV Index:   27           LV E/e' medial:  8.8 LVOT Area:     3.80 cm     LV e' lateral:   7.15 cm/s                             LV E/e' lateral: 6.5  LV Volumes (MOD) LV vol d, MOD A4C: 137.0 ml LV vol s, MOD A4C: 90.1 ml LV SV MOD A4C:     137.0 ml RIGHT VENTRICLE RV S prime:     17.10 cm/s TAPSE (M-mode): 1.8 cm LEFT ATRIUM             Index LA Vol (A2C):   38.5 ml 16.38 ml/m LA Vol (A4C):   38.1 ml 16.21 ml/m LA Biplane Vol: 39.8 ml 16.93 ml/m  AORTIC VALVE AV Area (Vmax):    2.73 cm AV Area (Vmean):   2.77 cm AV Area (VTI):     2.81 cm AV Vmax:           137.00 cm/s AV Vmean:          90.900 cm/s AV VTI:            0.229 m AV Peak Grad:      7.5 mmHg AV Mean Grad:      4.0 mmHg LVOT Vmax:         98.43 cm/s LVOT Vmean:        66.167 cm/s LVOT VTI:          0.169 m LVOT/AV VTI ratio: 0.74  AORTA Ao Root diam: 3.60 cm Ao Asc diam:  3.40 cm MITRAL VALVE MV Area (PHT): 2.98 cm    SHUNTS MV Decel Time: 255 msec    Systemic VTI:   0.17 m MV E velocity: 46.50 cm/s  Systemic Diam: 2.20 cm MV A velocity: 78.03 cm/s MV E/A ratio:  0.60 Dietrich Pates MD Electronically signed by Dietrich Pates MD Signature Date/Time: 01/25/2023/4:26:08 PM    Final    DG Chest Portable 1 View  Result Date: 01/24/2023 CLINICAL DATA:  Shortness of breath.  History of ALS and diabetes. EXAM: PORTABLE CHEST 1 VIEW COMPARISON:  No prior radiographs available.  Chest CT 01/14/2022. FINDINGS: 1153 hours. There are lower lung volumes. The heart size and mediastinal contours are stable. New patchy left greater than right airspace opacities may reflect atelectasis or early infiltrates. Possible small left pleural effusion. No evidence of pneumothorax or acute osseous abnormality. Telemetry leads overlie the chest. IMPRESSION: Lower lung volumes with new patchy left greater than right airspace opacities which may reflect atelectasis or early infiltrates. Possible small left pleural effusion. Electronically Signed   By: Carey Bullocks M.D.   On: 01/24/2023 12:07    Microbiology: Results for  orders placed or performed during the hospital encounter of 01/24/23  Culture, blood (Routine X 2) w Reflex to ID Panel     Status: None   Collection Time: 01/24/23  6:25 PM   Specimen: BLOOD RIGHT HAND  Result Value Ref Range Status   Specimen Description BLOOD RIGHT HAND  Final   Special Requests   Final    BOTTLES DRAWN AEROBIC AND ANAEROBIC Blood Culture adequate volume   Culture   Final    NO GROWTH 5 DAYS Performed at [facility], [address] [place], [place] [address]    Report Status 01/29/2023 FINAL  Final  Culture, blood (Routine X 2) w Reflex to ID Panel     Status: None   Collection Time: 01/24/23  6:25 PM   Specimen: BLOOD RIGHT HAND  Result Value Ref Range Status   Specimen Description BLOOD RIGHT HAND  Final   Special Requests   Final    BOTTLES DRAWN AEROBIC AND ANAEROBIC Blood Culture adequate volume   Culture   Final    NO GROWTH 5  DAYS Performed at [facility], [address] [place], [place] [address]    Report Status 01/29/2023 FINAL  Final  Urine Culture (for pregnant, neutropenic or urologic patients or patients with an indwelling urinary catheter)     Status: None   Collection Time: 01/25/23  5:09 PM   Specimen: Urine, Catheterized  Result Value Ref Range Status   Specimen Description URINE, CATHETERIZED  Final   Special Requests NONE  Final   Culture   Final    NO GROWTH Performed at [facility], [address] [place], [place] [address]    Report Status 01/26/2023 FINAL  Final    Labs: CBC: Recent Labs  Lab 01/24/23 1140 01/24/23 1148 01/25/23 0205 01/26/23 0644 01/27/23 0222  WBC 12.0*  --  10.5 9.7 8.8  NEUTROABS 9.8*  --   --   --   --   HGB 14.6 15.6 12.9* 12.4* 12.2*  HCT 41.4 46.0 36.8* 34.5* 34.7*  MCV 93.5  --  92.2 91.3 92.8  PLT 212  --  212 188 183   Basic Metabolic Panel: Recent Labs  Lab 01/24/23 1140 01/24/23 1148 01/25/23 0205 01/26/23 0644 01/27/23 0222 01/28/23 1000  NA 125* 125* 122* 129* 128* 130*  K 4.3 4.3 3.9 4.1 4.0 4.0  CL 87* 89* 85* 92* 89* 91*  CO2 25  --  24 25 27 29   GLUCOSE 183* 176* 199* 124* 208* 113*  BUN 24* 29* 24* 24* 26* 22  CREATININE 0.78 0.70 0.76 0.69 0.76 0.76  CALCIUM 9.3  --  8.5* 8.8* 8.7* 8.8*  MG 1.8  --  1.8 1.9 1.8  --   PHOS  --   --  3.7 4.0 3.5  --    Liver Function Tests: Recent Labs  Lab 01/24/23 1140 01/26/23 0644 01/27/23 0222  AST 49* 46* 38  ALT 55* 72* 68*  ALKPHOS 104 76 75  BILITOT 1.2 0.8 0.7  PROT 6.8 5.4* 5.3*  ALBUMIN 3.7 2.8* 2.8*   CBG: Recent Labs  Lab 01/28/23 1557 01/28/23 1959 01/28/23 2357 01/29/23 0429 01/29/23 0733  GLUCAP 282* 248* 241* 188* 129*    Discharge time spent: {LESS THAN/GREATER BJYN:82956}THAN:26388} 30 minutes.  Signed: Kathlen ModyVijaya Lagretta Loseke, MD [facility] 01/29/2023

## 2023-01-29 NOTE — TOC Transition Note (Signed)
Transition of Care [facility]) - CM/SW Discharge Note   Patient Details  Name: [name] [name] MRN: [id number] Date of Birth: 06/29/1951  [facility] CM/SW Contact:  Gala Lewandowsky, RN Phone Number: 01/29/2023, 10:42 AM   Clinical Narrative: Case Manager spoke with patient and son at the bedside regarding disposition needs. Case Manager called [facility] and they hope to have staff visit the patient on Saturday. Spouse to call the personal care agencies for additional assistance in the home. Family agreeable to Case Manager calling [facility] for 1130 time slot for transportation home. Patient has trilogy vent- BIPAP settings for home. Family will ride with [facility]. No further needs identified.     Final next level of care: Home w Home Health Services (Family looking in to personal care services.) Barriers to Discharge: No Barriers Identified   Patient Goals and CMS Choice CMS Medicare.gov Compare Post Acute Care list provided to:: Patient Choice offered to / list presented to : Spouse, Patient  Discharge Plan and Services Additional resources added to the After Visit Summary for   In-house Referral: NA Discharge Planning Services: CM Consult Post Acute Care Choice: Home Health, Resumption of Svcs/PTA Provider          DME Arranged: N/A DME Agency: NA       HH Arranged: RN, Disease Management, PT, Nurse's Aide HH Agency: [facility] Date [facility] Agency Contacted: 01/28/23 Time HH Agency Contacted: 1528 Representative spoke with at [facility] Agency: Kandee Keen  Social Determinants of Health (SDOH) Interventions SDOH Screenings   Food Insecurity: No Food Insecurity (02/19/2022)  Housing: Low Risk  (02/13/2021)  Transportation Needs: No Transportation Needs (02/19/2022)  Alcohol Screen: Low Risk  (02/19/2022)  Depression (PHQ2-9): Low Risk  (12/08/2022)  Financial Resource Strain: Low Risk  (02/19/2022)  Physical Activity: Insufficiently Active (02/13/2021)  Social Connections:  Moderately Isolated (02/19/2022)  Stress: No Stress Concern Present (02/19/2022)  Tobacco Use: Medium Risk (12/08/2022)    Readmission Risk Interventions     No data to display

## 2023-01-29 NOTE — Progress Notes (Signed)
Order to discharge pt home.  Discharge instructions/AVS given to patient and reviewed - education provided as needed.  Pt advised to call PCP and/or come back to the hospital if there are any problems. Pt verbalized understanding.    Family at bedside - reviewed AVS and medications.  Catheter care reviewed.  All questions answered.

## 2023-02-01 ENCOUNTER — Telehealth: Payer: Self-pay | Admitting: Internal Medicine

## 2023-02-01 NOTE — Telephone Encounter (Signed)
Caller/Agency: Del[facility]) Callback Number: [phone], ok to LDVM Requesting OT/PT/Skilled Nursing/Social Work/Speech Therapy: Nursing Frequency: 2 w 1, 1 w 3  Caller/Agency: Archie Patten [facility]) Callback Number: [phone], ok to LDVM Requesting OT/PT/Skilled Nursing/Social Work/Speech Therapy: HH Aid Frequency: 1 w 6   Also, ok to add their social worker?

## 2023-02-01 NOTE — Telephone Encounter (Signed)
LMOM for Tonya w/ verbal orders.

## 2023-02-02 ENCOUNTER — Telehealth: Payer: Self-pay | Admitting: Internal Medicine

## 2023-02-02 NOTE — Telephone Encounter (Signed)
[facility] Callback Number: [phone] Requesting OT/PT/Skilled Nursing/Social Work/Speech Therapy: PT Frequency: 2 w 1, 1 w 3

## 2023-02-02 NOTE — Telephone Encounter (Signed)
Spoke w/ Jim- verbal orders given.  

## 2023-02-03 ENCOUNTER — Inpatient Hospital Stay: Payer: Medicare Other | Admitting: Family Medicine

## 2023-02-08 ENCOUNTER — Other Ambulatory Visit: Payer: Self-pay | Admitting: Internal Medicine

## 2023-02-08 ENCOUNTER — Other Ambulatory Visit (HOSPITAL_BASED_OUTPATIENT_CLINIC_OR_DEPARTMENT_OTHER): Payer: Self-pay

## 2023-02-08 MED ORDER — METFORMIN HCL 500 MG/5ML PO SOLN
5.0000 mL | Freq: Two times a day (BID) | ORAL | 0 refills | Status: DC
  Filled 2023-02-08: qty 473, 47d supply, fill #0

## 2023-02-09 ENCOUNTER — Other Ambulatory Visit (HOSPITAL_BASED_OUTPATIENT_CLINIC_OR_DEPARTMENT_OTHER): Payer: Self-pay

## 2023-02-10 ENCOUNTER — Other Ambulatory Visit (HOSPITAL_BASED_OUTPATIENT_CLINIC_OR_DEPARTMENT_OTHER): Payer: Self-pay

## 2023-02-11 ENCOUNTER — Telehealth: Payer: Self-pay | Admitting: Internal Medicine

## 2023-02-11 ENCOUNTER — Other Ambulatory Visit: Payer: Self-pay | Admitting: Family

## 2023-02-11 MED ORDER — ONDANSETRON HCL 4 MG PO TABS: 4.0000 mg | ORAL_TABLET | Freq: Three times a day (TID) | ORAL | 0 refills | Status: DC | PRN

## 2023-02-11 NOTE — Telephone Encounter (Signed)
Rx sent for Zofran by [facility]- unable to call [facility] back as I don't have a call back number.

## 2023-02-11 NOTE — Telephone Encounter (Signed)
LMOM informing Pt's wife that Zofran has been called into [facility]. Instructed her to ask pharmacist on how to crush for PEG tube.

## 2023-02-11 NOTE — Telephone Encounter (Signed)
April with [facility] called to advise that pt has being having bouts of nausea and she wanted to see if provider could call in something for nausea for him.

## 2023-02-11 NOTE — Telephone Encounter (Signed)
Please advise. Pt does have peg tube for ALS.

## 2023-02-15 ENCOUNTER — Other Ambulatory Visit: Payer: Self-pay | Admitting: Medical

## 2023-02-15 ENCOUNTER — Ambulatory Visit (HOSPITAL_BASED_OUTPATIENT_CLINIC_OR_DEPARTMENT_OTHER)
Admission: RE | Admit: 2023-02-15 | Discharge: 2023-02-15 | Disposition: A | Discharge status: Home / Self Care | Payer: Medicare Other | Source: Ambulatory Visit | Attending: Medical | Admitting: Medical

## 2023-02-15 ENCOUNTER — Encounter: Payer: Self-pay | Admitting: Medical

## 2023-02-15 ENCOUNTER — Ambulatory Visit (INDEPENDENT_AMBULATORY_CARE_PROVIDER_SITE_OTHER): Payer: Medicare Other | Admitting: Medical

## 2023-02-15 VITALS — BP 140/86 | HR 84 | Resp 18

## 2023-02-15 DIAGNOSIS — Z7902 Long term (current) use of antithrombotics/antiplatelets: Secondary | ICD-10-CM

## 2023-02-15 DIAGNOSIS — R0981 Nasal congestion: Secondary | ICD-10-CM

## 2023-02-15 DIAGNOSIS — K635 Polyp of colon: Secondary | ICD-10-CM

## 2023-02-15 DIAGNOSIS — E782 Mixed hyperlipidemia: Secondary | ICD-10-CM

## 2023-02-15 DIAGNOSIS — Z9181 History of falling: Secondary | ICD-10-CM

## 2023-02-15 DIAGNOSIS — I213 ST elevation (STEMI) myocardial infarction of unspecified site: Secondary | ICD-10-CM | POA: Diagnosis not present

## 2023-02-15 DIAGNOSIS — Z7984 Long term (current) use of oral hypoglycemic drugs: Secondary | ICD-10-CM

## 2023-02-15 DIAGNOSIS — R61 Generalized hyperhidrosis: Secondary | ICD-10-CM | POA: Insufficient documentation

## 2023-02-15 DIAGNOSIS — R197 Diarrhea, unspecified: Secondary | ICD-10-CM | POA: Diagnosis not present

## 2023-02-15 DIAGNOSIS — I10 Essential (primary) hypertension: Secondary | ICD-10-CM

## 2023-02-15 DIAGNOSIS — Z8701 Personal history of pneumonia (recurrent): Secondary | ICD-10-CM | POA: Diagnosis present

## 2023-02-15 DIAGNOSIS — E119 Type 2 diabetes mellitus without complications: Secondary | ICD-10-CM

## 2023-02-15 DIAGNOSIS — G1221 Amyotrophic lateral sclerosis: Secondary | ICD-10-CM | POA: Diagnosis not present

## 2023-02-15 DIAGNOSIS — J9621 Acute and chronic respiratory failure with hypoxia: Secondary | ICD-10-CM | POA: Diagnosis not present

## 2023-02-15 DIAGNOSIS — Z9981 Dependence on supplemental oxygen: Secondary | ICD-10-CM

## 2023-02-15 DIAGNOSIS — F419 Anxiety disorder, unspecified: Secondary | ICD-10-CM | POA: Diagnosis not present

## 2023-02-15 DIAGNOSIS — D72829 Elevated white blood cell count, unspecified: Secondary | ICD-10-CM

## 2023-02-15 DIAGNOSIS — Z931 Gastrostomy status: Secondary | ICD-10-CM

## 2023-02-15 DIAGNOSIS — E871 Hypo-osmolality and hyponatremia: Secondary | ICD-10-CM | POA: Diagnosis not present

## 2023-02-15 DIAGNOSIS — Z466 Encounter for fitting and adjustment of urinary device: Secondary | ICD-10-CM

## 2023-02-15 DIAGNOSIS — Z87891 Personal history of nicotine dependence: Secondary | ICD-10-CM

## 2023-02-15 DIAGNOSIS — I4510 Unspecified right bundle-branch block: Secondary | ICD-10-CM

## 2023-02-15 DIAGNOSIS — I509 Heart failure, unspecified: Secondary | ICD-10-CM | POA: Diagnosis not present

## 2023-02-15 DIAGNOSIS — E039 Hypothyroidism, unspecified: Secondary | ICD-10-CM

## 2023-02-15 DIAGNOSIS — Z7982 Long term (current) use of aspirin: Secondary | ICD-10-CM

## 2023-02-15 DIAGNOSIS — G4700 Insomnia, unspecified: Secondary | ICD-10-CM

## 2023-02-15 DIAGNOSIS — I2510 Atherosclerotic heart disease of native coronary artery without angina pectoris: Secondary | ICD-10-CM | POA: Diagnosis not present

## 2023-02-15 LAB — COMPREHENSIVE METABOLIC PANEL
ALT: 74 U/L — ABNORMAL HIGH (ref 0–53)
AST: 37 U/L (ref 0–37)
Albumin: 3.9 g/dL (ref 3.5–5.2)
Alkaline Phosphatase: 88 U/L (ref 39–117)
BUN: 35 mg/dL — ABNORMAL HIGH (ref 6–23)
CO2: 29 mEq/L (ref 19–32)
Calcium: 9 mg/dL (ref 8.4–10.5)
Chloride: 89 mEq/L — ABNORMAL LOW (ref 96–112)
Creatinine, Ser: 0.82 mg/dL (ref 0.40–1.50)
GFR: 88.96 mL/min (ref >60.00)
Glucose, Bld: 233 mg/dL — ABNORMAL HIGH (ref 70–99)
Potassium: 4.5 mEq/L (ref 3.5–5.1)
Sodium: 128 mEq/L — ABNORMAL LOW (ref 135–145)
Total Bilirubin: 0.6 mg/dL (ref 0.2–1.2)
Total Protein: 6.3 g/dL (ref 6.0–8.3)

## 2023-02-15 LAB — CBC WITH DIFFERENTIAL/PLATELET
Basophils Absolute: 0 10*3/uL (ref 0.0–0.1)
Basophils Relative: 0.4 % (ref 0.0–3.0)
Eosinophils Absolute: 0.9 10*3/uL — ABNORMAL HIGH (ref 0.0–0.7)
Eosinophils Relative: 8 % — ABNORMAL HIGH (ref 0.0–5.0)
HCT: 40.8 % (ref 39.0–52.0)
Hemoglobin: 13.9 g/dL (ref 13.0–17.0)
Lymphocytes Relative: 10 % — ABNORMAL LOW (ref 12.0–46.0)
Lymphs Abs: 1.1 10*3/uL (ref 0.7–4.0)
MCHC: 34.1 g/dL (ref 30.0–36.0)
MCV: 95.7 fl (ref 78.0–100.0)
Monocytes Absolute: 0.8 10*3/uL (ref 0.1–1.0)
Monocytes Relative: 7.4 % (ref 3.0–12.0)
Neutro Abs: 8.2 10*3/uL — ABNORMAL HIGH (ref 1.4–7.7)
Neutrophils Relative %: 74.2 % (ref 43.0–77.0)
Platelets: 252 10*3/uL (ref 150.0–400.0)
RBC: 4.26 Mil/uL (ref 4.22–5.81)
RDW: 12.9 % (ref 11.5–15.5)
WBC: 11.1 10*3/uL — ABNORMAL HIGH (ref 4.0–10.5)

## 2023-02-15 LAB — BRAIN NATRIURETIC PEPTIDE: Pro B Natriuretic peptide (BNP): 25 pg/mL (ref 0.0–100.0)

## 2023-02-15 MED ORDER — FLUTICASONE PROPIONATE 50 MCG/ACT NA SUSP: 2.0000 | Freq: Every day | NASAL | 1 refills | Status: DC

## 2023-02-15 MED ORDER — CEFDINIR 300 MG PO CAPS: 300.0000 mg | ORAL_CAPSULE | Freq: Two times a day (BID) | ORAL | 0 refills | Status: DC

## 2023-02-15 MED ORDER — CLONAZEPAM 0.5 MG PO TABS: ORAL_TABLET | ORAL | 0 refills | Status: DC

## 2023-02-15 MED ORDER — AZITHROMYCIN 250 MG PO TABS: ORAL_TABLET | ORAL | 0 refills | Status: AC

## 2023-02-15 NOTE — Progress Notes (Signed)
Subjective:    Patient ID: [name] [name], male    DOB: Jun 10, 1951, 72 y.o.   MRN: [id number]  HPI Admit date:     01/24/2023  Discharge date: 01/29/2023  Discharge Physician: Kathlen Mody    PCP: Wanda Plump, MD    Recommendations at discharge:  Please follow up with PCP in one week.  Please follow up with cbc and bmp in one week Recommend outpatient follow up with UROLOGY for voiding trial.  Please follow up with cardiology as recommended.    Discharge Diagnoses: Principal Problem:   STEMI (ST elevation myocardial infarction) Active Problems:   Mixed hyperlipidemia   DJD (degenerative joint disease)   Hypothyroidism   Chronic obstructive pulmonary disease   Essential hypertension   Coronary artery disease involving native coronary artery of native heart without angina pectoris   ALS (amyotrophic lateral sclerosis)   CAP (community acquired pneumonia)   Malnutrition of moderate degree   Bacterial pneumonia   Hyponatremia   Acute ST elevation myocardial infarction (STEMI) of anterolateral wall   Acute urinary retention   Urinary tract infection   Acute HFrEF (heart failure with reduced ejection fraction)   ACS (acute coronary syndrome)   Resolved Problems:   Acute on chronic respiratory failure with hypoxia   Hospital Course: 72 year old M with PMH of ALS with tube feed and trilogy dependence, COPD, DM-2, hypothyroidism, HTN and HLD presenting with acutely increased shortness of breath, and admitted for STEMI, possible pneumonia and hyponatremia.  Troponin elevated to 1000.  EKG with ST elevation in lateral leads.  BNP 520.  CXR concerning for bilateral infiltrates. Na 125.  Cardiology consulted.  Patient was started on IV heparin, ceftriaxone and azithromycin.  Patient and family deferred invasive procedures.  TTE ordered.  Cardiology following.   TTE with LVEF of 30 to 35% and possible Takotsubo cardiomyopathy.  Remains on therapeutic dose Lovenox for a total of 72 hours.   Cardiology following.   Hospital course complicated by acute urinary retention and bladder over distention.  He had about 1.8 L urine on I and O cath.  Renal ultrasound concerning for UTI but no hydronephrosis.  Urinalysis with pyuria.  Foley catheter inserted.  Started on IV ceftriaxone and Cardura.  Urine cultures are negative.    TTE with LVEF of 30 to 35% and possible Takotsubo cardiomyopathy.  Remains on therapeutic dose Lovenox for a total of 72 hours.  Cardiology following.   Hospital course complicated by acute urinary retention and bladder over distention.  He had about 1.8 L urine on I and O cath.  Renal ultrasound concerning for UTI but no hydronephrosis.  Urinalysis with pyuria.  Foley catheter inserted.  Started on IV ceftriaxone and Cardura.  Urine cultures are negative.      Assessment and Plan:   STEMI in the setting of coronary artery disease On admission troponins elevated at 1054  and pt admitted with sob.  EKG showing ST elevation in the lateral leads. Patient and family not interested in invasive intervention Echocardiogram showed left ventricle ejection fraction of 30 to 35% Cardiology on board and recommends medical management with aspirin, Plavix, beta-blocker and statin and patient completed 3 days of Lovenox. Resume home meds.      Acute systolic heart failure Echo showing left ventricle ejection fraction of 30 to 35% possible Takotsubo's cardiomyopathy Cardiology on board currently on Lasix and GDMT. Continue with strict intake and output and daily weights.       Bibasilar pneumonia  Completed 5 days of IV rocephin.  Cultures negative till date     Essential hypertension  Well controlled.  continue with metoprolol.     Acquired hypothyroidism Continue with Synthroid.     Uncontrolled type 2 diabetes with hyperglycemia Resume home meds on discharge.      Urinary retention/mild UTI Foley catheter placement. Some hematuria today possibly from  clots.  Irrigated and urine has cleared up Urine culture are negative to date Recommend keeping the Foley in for another week for bladder rest outpatient follow-up with urology for voiding trial.   Continue with Cardura for possible BPH     S/p PEG tube placement       Hyponatremia Suspect from hypervolemia.  Sodium improved to 130.  Continue to monitor. Recommend checking sodium in  one week.       ALS Patient is trilogy and PEG dependent Continue with riluzole and bolus tube feedings per her nutritionist     Moderate malnutrition Possibly from ALS, dysphagia Nutritionist on board     Malnutrition Type:   Nutrition Problem: Moderate Malnutrition Etiology: chronic illness (ALS, dysphagia now s/p G-tube placement)     Malnutrition Characteristics:   Signs/Symptoms: mild fat depletion, moderate fat depletion, mild muscle depletion, moderate muscle depletion     Nutrition Interventions:   Interventions: Refer to RD note for recommendations   Estimated body mass index is 28 kg/m as calculated from the following:   Height as of this encounter:  (1.93 m).   Weight as of this encounter: 104.3 kg.         Consultants: cardiology Procedures performed: echo  Disposition: Home Diet recommendation:   On discussion with me. Family basically describe he is reporting feeling hot and sweating more easier before he was admitted. On discussion this causes concern that maybe pneumonia still present or maybe uti on discussion. No wheezing, no shortness of breath. No chest pain reported either.     Pt had urine culture negative in hospital. He has catheter placed. Pt has hx of pneumonia the emergency dept. Recent sweating easier. Which was the case prior to admission. Family thinks he has been stressed recently.   Pt ALS nurse want him to be on anxiolytic. Some upset stomach at times.   Some constipation. No recent constipation preceding recent loose stools. Though  early April xray did show constipation. Family described tx in hospital cleared constipation excessively.  Hx of diabetes. He is on liquid metformin. Some diarrhea. This has been since discharge. One loose stool a day. His A1c was 7.9 early April.  Some lower back/lumbar ara pain on palpation. Also stuffy nose.    Review of Systems  Constitutional:  Positive for diaphoresis. Negative for chills, fatigue and fever.  HENT:  Positive for congestion. Negative for ear pain.   Respiratory:  Negative for cough, chest tightness, shortness of breath and stridor.   Cardiovascular:  Negative for chest pain and palpitations.  Gastrointestinal:  Negative for blood in stool, diarrhea and vomiting.  Genitourinary:  Negative for dysuria, frequency and genital sores.  Musculoskeletal:  Negative for back pain and neck pain.  Skin:  Negative for rash.  Neurological:  Negative for dizziness, speech difficulty, weakness, light-headedness and headaches.  Hematological:  Negative for adenopathy. Does not bruise/bleed easily.       Objective:   Physical Exam  General Mental Status- Alert. General Appearance- Not in acute distress.   Skin General: Color- Normal Color. Moisture- Normal Moisture.  Neck Carotid Arteries-  Normal color. Moisture- Normal Moisture. No carotid bruits. No JVD.  Chest and Lung Exam Auscultation: Breath Sounds:-Normal.  Cardiovascular Auscultation:Rythm- Regular. Murmurs & Other Heart Sounds:Auscultation of the heart reveals- No Murmurs.  Abdomen Inspection:-Inspeection Normal. Palpation/Percussion:Note:No mass. Palpation and Percussion of the abdomen reveal- Non Tender, Non Distended + BS, no rebound or guarding.   Neurologic Cranial Nerve exam:- CN III-XII intact(No nystagmus), symmetric smile. Drift Test:- No drift. Romberg Exam:- Negative.  Heal to Toe Gait exam:-Normal. Finger to Nose:- Normal/Intact Strength:- 5/5 equal and symmetric strength both upper and  lower extremities.   Lower ext- no pedal edema. Negative homans signs.      Assessment & Plan:   Patient Instructions  1. Diarrhea, unspecified type Minimal once daily but some loose stools since dc from hospital. Hydrate well with propel fitness water.  - CBC w/Diff - Comp Met (CMET) - B Nat Peptide; Future - Ova and parasite examination; Future - Clostridium Difficile by PCR; Future  2. Diaphoresis Will evaluate if pneumonia present. Also see if uti by culture. Explained culture form foley may show various types bacteria. But is one specifiic type grows out will treat.  - CBC w/Diff - Comp Met (CMET) - Urine Culture  3. Hyponatremia Hx of low sodium - Comp Met (CMET)  4. Anxiety Clonazepam low dose. Expained use 1/2 tab twice daily. Night time dose before you sleep.  5. Insomnia, unspecified type Clonazepam may help you sleep in addition help with anxiety.  6. ALS (amyotrophic lateral sclerosis)   7. Nasal congestion Flonase add on to current regimen use saline nasal spray during the day. Continue astelni spray  8. Hx of pneumonia. Diaphoresis.  9. Chf hx. Low EF. Stat bnp.   For nausea. Can use 2 tab of zofran every 8 hours if needed.    Follow up date to be determined after lab review.    Esperanza Richters, PA-C   Time spent with patient today was  55 minutes which consisted of chart review, discussing diagnoses,  discussing case with Dr. Drue Novel pt pcp ,work up treatment and documentation.   Note pt family does not want me to order troponin test though they did inquire about this lab result. No chest pain presently. Did explain if they wanted to order could but in event of even minimal elevation would at that point advise ED evaluation. Family members decline. Before they asked did not think clinically indicated.

## 2023-02-15 NOTE — Patient Instructions (Addendum)
1. Diarrhea, unspecified type Minimal once daily but some loose stools since dc from hospital. Hydrate well with propel fitness water.  - CBC w/Diff - Comp Met (CMET) - B Nat Peptide; Future - Ova and parasite examination; Future - Clostridium Difficile by PCR; Future  2. Diaphoresis Will evaluate if pneumonia present. Also see if uti by culture. Explained culture form foley may show various types bacteria. But is one specifiic type grows out will treat.  - CBC w/Diff - Comp Met (CMET) - Urine Culture  3. Hyponatremia Hx of low sodium - Comp Met (CMET)  4. Anxiety Clonazepam low dose. Explained use 1/2 tab twice daily. Night time dose before you sleep.  5. Insomnia, unspecified type Clonazepam may help you sleep in addition help with anxiety.  6. ALS (amyotrophic lateral sclerosis)   7. Nasal congestion Flonase add on to current regimen use saline nasal spray during the day. Continue astelni spray  8. Hx of pneumonia. Diaphoresis.  9. Chf hx. Low EF. Stat bnp.   For nausea. Can use 2 tab of zofran every 8 hours if needed.    Follow up date to be determined after lab review.

## 2023-02-16 ENCOUNTER — Telehealth: Payer: Self-pay

## 2023-02-16 ENCOUNTER — Encounter: Payer: Self-pay | Admitting: Medical

## 2023-02-16 LAB — URINE CULTURE
MICRO NUMBER:: 14856017
Result:: NO GROWTH
SPECIMEN QUALITY:: ADEQUATE

## 2023-02-16 NOTE — Addendum Note (Signed)
Addended by: Gwenevere Abbot on: 02/16/2023 02:34 PM   Modules accepted: Orders

## 2023-02-16 NOTE — Telephone Encounter (Signed)
Plan of care (order number: [id number]) signed and faxed back to [facility] at [phone]. Form sent for scanning.

## 2023-02-17 ENCOUNTER — Other Ambulatory Visit: Payer: Medicare Other

## 2023-02-17 DIAGNOSIS — R197 Diarrhea, unspecified: Secondary | ICD-10-CM

## 2023-02-18 ENCOUNTER — Encounter: Payer: Self-pay | Admitting: Internal Medicine

## 2023-02-19 ENCOUNTER — Telehealth: Payer: Self-pay | Admitting: Internal Medicine

## 2023-02-19 LAB — OVA AND PARASITE EXAMINATION
CONCENTRATE RESULT:: NONE SEEN
MICRO NUMBER:: 14867807
SPECIMEN QUALITY:: ADEQUATE
TRICHROME RESULT:: NONE SEEN

## 2023-02-19 MED ORDER — TRAZODONE HCL 100 MG PO TABS: 100.0000 mg | ORAL_TABLET | Freq: Every day | ORAL | 3 refills | Status: DC

## 2023-02-19 NOTE — Telephone Encounter (Signed)
Pt unable to speak d/t ALS. Mychart message sent to Pt. Med list updated.

## 2023-02-19 NOTE — Telephone Encounter (Signed)
Recommend trazodone 100 mg at bedtime. Repeat a second trazodone 3 hours later if needed. If this is not helping please call next week

## 2023-02-19 NOTE — Telephone Encounter (Signed)
Please advise 

## 2023-02-19 NOTE — Addendum Note (Signed)
Addended byConrad Gordon D on: 02/19/2023 01:47 PM   Modules accepted: Orders

## 2023-02-19 NOTE — Telephone Encounter (Signed)
[name] [name] with [facility] called to advise that pt is on Trazadone but he's only able to get 1- 1.5 hours of sleep. He is going to sleep fine but doesn't stay sleep too long. Pt wants to know if something else can be called in for him. Please call pt to advise.

## 2023-02-20 LAB — CLOSTRIDIUM DIFFICILE BY PCR: Toxigenic C. Difficile by PCR: POSITIVE — AB

## 2023-02-20 MED ORDER — METRONIDAZOLE 500 MG PO TABS: 500.0000 mg | ORAL_TABLET | Freq: Three times a day (TID) | ORAL | 0 refills | Status: AC

## 2023-02-20 NOTE — Addendum Note (Signed)
Addended by: Gwenevere Abbot on: 02/20/2023 08:03 AM   Modules accepted: Orders

## 2023-02-22 ENCOUNTER — Telehealth: Payer: Self-pay | Admitting: Medical

## 2023-02-22 ENCOUNTER — Encounter: Payer: Self-pay | Admitting: Internal Medicine

## 2023-02-22 NOTE — Telephone Encounter (Signed)
Requesting: clonazepam 0.5mg   Contract: n/a UDS: N/a Last Visit: 02/15/23 Next Visit: 04/12/23 Last Refill: 02/15/23 #10 and 0RF   Please Advise

## 2023-02-23 ENCOUNTER — Telehealth: Payer: Self-pay | Admitting: Internal Medicine

## 2023-02-23 NOTE — Telephone Encounter (Signed)
Patient's wife stated her husband is supposed to come in for labs but it is hard to get him in, so the nurse who comes and does house checks will be able to get the labs. They need the lab orders sent to their office, their number is [facility] care [phone]. She stated she did not have their fax number, just the phone number. She would like a call back to know that it's been done.

## 2023-02-23 NOTE — Telephone Encounter (Signed)
Yes, please also get a TSH DX hypothyroidism.

## 2023-02-23 NOTE — Telephone Encounter (Signed)
Requested labs faxed to [facility]. Spoke w/ Harriett Sine- informed orders have been faxed.

## 2023-02-23 NOTE — Telephone Encounter (Signed)
Please advise? I see Ramon Dredge ordered a CMP and CBC w/ diff- did you want anything else on him?

## 2023-02-24 LAB — HEPATIC FUNCTION PANEL
ALT: 87 U/L — AB (ref 10–40)
AST: 60 — AB (ref 14–40)
Alkaline Phosphatase: 92 (ref 25–125)
Bilirubin, Total: 0.5

## 2023-02-24 LAB — CBC AND DIFFERENTIAL
HCT: 39 — AB (ref 41–53)
Hemoglobin: 13.2 — AB (ref 13.5–17.5)
Neutrophils Absolute: 8.2
Platelets: 214 10*3/uL (ref 150–400)
WBC: 10.8

## 2023-02-24 LAB — COMPREHENSIVE METABOLIC PANEL
Albumin: 3.6 (ref 3.5–5.0)
Calcium: 9.2 (ref 8.7–10.7)
Globulin: 1.9
eGFR: 101

## 2023-02-24 LAB — BASIC METABOLIC PANEL
BUN: 31 — AB (ref 4–21)
CO2: 26 — AB (ref 13–22)
Chloride: 88 — AB (ref 99–108)
Creatinine: 0.7 (ref 0.6–1.3)
Glucose: 135
Potassium: 4.7 mEq/L (ref 3.5–5.1)
Sodium: 129 — AB (ref 137–147)

## 2023-02-24 LAB — TSH: TSH: 9.14 — AB (ref 0.41–5.90)

## 2023-02-24 LAB — CBC: RBC: 4.06 (ref 3.87–5.11)

## 2023-02-24 NOTE — Telephone Encounter (Signed)
Spoke w/ April at [facility]- verbal orders given for CMP, CBC w/ diff and TSH.

## 2023-02-25 ENCOUNTER — Encounter: Payer: Self-pay | Admitting: Internal Medicine

## 2023-02-25 LAB — LAB REPORT - SCANNED: EGFR: 101

## 2023-02-26 ENCOUNTER — Encounter: Payer: Self-pay | Admitting: Internal Medicine

## 2023-02-26 ENCOUNTER — Telehealth: Payer: Self-pay

## 2023-02-26 NOTE — Telephone Encounter (Signed)
Physician orders (order number: [id number]) signed and faxed back to [facility] at [phone]. Form sent for scanning.

## 2023-03-01 ENCOUNTER — Emergency Department (HOSPITAL_COMMUNITY)
Admission: EM | Admit: 2023-03-01 | Discharge: 2023-03-01 | Disposition: A | Discharge status: Home / Self Care | Payer: Medicare Other | Attending: Emergency Medicine | Admitting: Emergency Medicine

## 2023-03-01 ENCOUNTER — Emergency Department (HOSPITAL_COMMUNITY): Payer: Medicare Other

## 2023-03-01 ENCOUNTER — Encounter (HOSPITAL_COMMUNITY): Payer: Self-pay

## 2023-03-01 ENCOUNTER — Telehealth: Payer: Self-pay | Admitting: *Deleted

## 2023-03-01 ENCOUNTER — Other Ambulatory Visit: Payer: Self-pay

## 2023-03-01 DIAGNOSIS — R0682 Tachypnea, not elsewhere classified: Secondary | ICD-10-CM | POA: Diagnosis not present

## 2023-03-01 DIAGNOSIS — Z79899 Other long term (current) drug therapy: Secondary | ICD-10-CM | POA: Diagnosis not present

## 2023-03-01 DIAGNOSIS — R0602 Shortness of breath: Secondary | ICD-10-CM | POA: Insufficient documentation

## 2023-03-01 DIAGNOSIS — R079 Chest pain, unspecified: Secondary | ICD-10-CM | POA: Insufficient documentation

## 2023-03-01 DIAGNOSIS — R059 Cough, unspecified: Secondary | ICD-10-CM | POA: Insufficient documentation

## 2023-03-01 DIAGNOSIS — F419 Anxiety disorder, unspecified: Secondary | ICD-10-CM | POA: Diagnosis not present

## 2023-03-01 LAB — COMPREHENSIVE METABOLIC PANEL
ALT: 87 U/L — ABNORMAL HIGH (ref 0–44)
AST: 48 U/L — ABNORMAL HIGH (ref 15–41)
Albumin: 3.1 g/dL — ABNORMAL LOW (ref 3.5–5.0)
Alkaline Phosphatase: 73 U/L (ref 38–126)
Anion gap: 14 (ref 5–15)
BUN: 34 mg/dL — ABNORMAL HIGH (ref 8–23)
CO2: 26 mmol/L (ref 22–32)
Calcium: 8.7 mg/dL — ABNORMAL LOW (ref 8.9–10.3)
Chloride: 88 mmol/L — ABNORMAL LOW (ref 98–111)
Creatinine, Ser: 0.75 mg/dL (ref 0.61–1.24)
GFR, Estimated: >60 mL/min (ref >60)
Glucose, Bld: 238 mg/dL — ABNORMAL HIGH (ref 70–99)
Potassium: 4.8 mmol/L (ref 3.5–5.1)
Sodium: 128 mmol/L — ABNORMAL LOW (ref 135–145)
Total Bilirubin: 0.6 mg/dL (ref 0.3–1.2)
Total Protein: 6 g/dL — ABNORMAL LOW (ref 6.5–8.1)

## 2023-03-01 LAB — CBC WITH DIFFERENTIAL/PLATELET
Abs Immature Granulocytes: 0.11 10*3/uL — ABNORMAL HIGH (ref 0.00–0.07)
Basophils Absolute: 0.1 10*3/uL (ref 0.0–0.1)
Basophils Relative: 1 %
Eosinophils Absolute: 0.2 10*3/uL (ref 0.0–0.5)
Eosinophils Relative: 2 %
HCT: 41 % (ref 39.0–52.0)
Hemoglobin: 13.8 g/dL (ref 13.0–17.0)
Immature Granulocytes: 1 %
Lymphocytes Relative: 10 %
Lymphs Abs: 1 10*3/uL (ref 0.7–4.0)
MCH: 32.8 pg (ref 26.0–34.0)
MCHC: 33.7 g/dL (ref 30.0–36.0)
MCV: 97.4 fL (ref 80.0–100.0)
Monocytes Absolute: 0.9 10*3/uL (ref 0.1–1.0)
Monocytes Relative: 8 %
Neutro Abs: 7.9 10*3/uL — ABNORMAL HIGH (ref 1.7–7.7)
Neutrophils Relative %: 78 %
Platelets: 209 10*3/uL (ref 150–400)
RBC: 4.21 MIL/uL — ABNORMAL LOW (ref 4.22–5.81)
RDW: 12.5 % (ref 11.5–15.5)
WBC: 10.2 10*3/uL (ref 4.0–10.5)
nRBC: 0 % (ref 0.0–0.2)

## 2023-03-01 LAB — TROPONIN I (HIGH SENSITIVITY)
Troponin I (High Sensitivity): 16 ng/L (ref <18)
Troponin I (High Sensitivity): 15 ng/L (ref <18)

## 2023-03-01 LAB — BRAIN NATRIURETIC PEPTIDE: B Natriuretic Peptide: 24.6 pg/mL (ref 0.0–100.0)

## 2023-03-01 LAB — LIPASE, BLOOD: Lipase: 31 U/L (ref 11–51)

## 2023-03-01 MED ORDER — OXYCODONE HCL 5 MG PO TABS: 5.0000 mg | ORAL_TABLET | ORAL | 0 refills | Status: DC | PRN

## 2023-03-01 MED ORDER — FENTANYL CITRATE PF 50 MCG/ML IJ SOSY
50.0000 ug | PREFILLED_SYRINGE | Freq: Once | INTRAMUSCULAR | Status: AC
  Administered 2023-03-01: 50 ug via INTRAVENOUS
  Filled 2023-03-01: qty 1

## 2023-03-01 MED ORDER — KETOROLAC TROMETHAMINE 30 MG/ML IJ SOLN
15.0000 mg | Freq: Once | INTRAMUSCULAR | Status: AC
  Administered 2023-03-01: 15 mg via INTRAVENOUS
  Filled 2023-03-01: qty 1

## 2023-03-01 MED ORDER — DIPHENHYDRAMINE-ZINC ACETATE 2-0.1 % EX CREA
TOPICAL_CREAM | Freq: Once | CUTANEOUS | Status: AC
  Filled 2023-03-01: qty 28

## 2023-03-01 NOTE — Progress Notes (Signed)
Pt arrived on his trilogy EVO NIV with [place] also in nose by EMS. Pt has ALS and O2 should be avoided unless necessary. This RT weaned down O2 and patient is on 21% still with his trilogy on. Mask adjusted and patient has less leak and getting better volumes.

## 2023-03-01 NOTE — ED Triage Notes (Signed)
Pt BIB [facility] from home c/o [facility] and congestion but has ALS and not able to cough it up. Pt arrived on his Bi-pap machine.

## 2023-03-01 NOTE — ED Provider Notes (Signed)
[place] EMERGENCY DEPARTMENT AT [facility] Provider Note   CSN: [id number] Arrival date & time: 03/01/23  0111     History  Chief Complaint  Patient presents with   Shortness of Breath    [name] [name] is a 72 y.o. male.  72 year old male with a history of ALS that presents the ER today with shortness of breath and cough with chest discomfort.  History is limited at this time secondary to ALS and being on CPAP continuously for the last 4 weeks.  Patient was admitted to the hospital about a month ago for [facility] pneumonia.  Sounds like he has been in talks with hospice since then.  He was DNR previously.  His wife is coming to give more history.  Patient's wife supplies more history.  Sounds like she states that he started having some anxiety shortness of breath and chest discomfort around 930 tonight and then got his not to medications and it seemed to calm down a little bit.  However just prior to arrival came back and he was quite distressed so they called EMS to bring him here.  After conversation with him via his phone and her and daughter will go ahead and initiate a workup understanding that there is very low likelihood of any invasive procedures to be done based on it.  Will disposition based on results.   Shortness of Breath      Home Medications Prior to Admission medications   Medication Sig Start Date End Date Taking? Authorizing Provider  oxyCODONE (ROXICODONE) 5 MG immediate release tablet Take 1-2 tablets (5-10 mg total) by mouth every 4 (four) hours as needed for severe pain. 03/01/23  Yes Ernestine Rohman, Barbara Cower, MD  Accu-Chek Softclix Lancets lancets Use as instructed 02/17/21   Wanda Plump, MD  aspirin 81 MG tablet Place 81 mg into feeding tube daily.    [provider]  atorvastatin (LIPITOR) 40 MG tablet Place 1 tablet (40 mg total) into feeding tube daily. 12/08/22   Wanda Plump, MD  azelastine (ASTELIN) 0.1 % nasal spray Place 2 sprays into both  nostrils at bedtime. 01/02/21   Wanda Plump, MD  Blood Glucose Monitoring Suppl (ACCU-CHEK AVIVA PLUS) w/Device KIT Use to check blood sugar 12/02/15   Wanda Plump, MD  cefdinir (OMNICEF) 300 MG capsule Take 1 capsule (300 mg total) by mouth 2 (two) times daily. 02/15/23   Saguier, Ramon Dredge, PA-C  clonazePAM (KLONOPIN) 0.25 MG disintegrating tablet Place 1-2 tablets (0.25-0.5 mg total) into feeding tube at bedtime as needed (insomnia). 02/23/23   Wanda Plump, MD  clopidogrel (PLAVIX) 75 MG tablet Place 1 tablet (75 mg total) into feeding tube daily. 01/29/23   Kathlen Mody, MD  fluticasone (FLONASE) 50 MCG/ACT nasal spray SHAKE LIQUID AND USE 2 SPRAYS IN [facility] NOSTRIL DAILY 02/16/23   Wanda Plump, MD  furosemide (LASIX) 20 MG tablet Take 1 tablet (20 mg total) by mouth daily. 01/29/23   Kathlen Mody, MD  glucose blood (ACCU-CHEK AVIVA) test strip Check blood sugar no more than twice daily 02/17/21   Wanda Plump, MD  glycopyrrolate (ROBINUL) 1 MG tablet Take 2 mg by mouth 3 (three) times daily. 12/24/22   [provider]  guaiFENesin-dextromethorphan (ROBITUSSIN DM) 100-10 MG/5ML syrup Place 5 mLs into feeding tube every 4 (four) hours as needed for cough. 01/28/23   Kathlen Mody, MD  levothyroxine (SYNTHROID) 88 MCG tablet Place 1 tablet (88 mcg total) into feeding tube daily  before breakfast. 12/08/22   Wanda Plump, MD  lisinopril (ZESTRIL) 2.5 MG tablet Place 1 tablet (2.5 mg total) into feeding tube daily. 12/08/22   Wanda Plump, MD  Melatonin 1 MG TABS Place 1 mg into feeding tube daily. Patient not taking: Reported on 01/24/2023    [provider]  Metformin HCl 500 MG/5ML SOLN Place 5 mLs (500 mg total) into feeding tube in the morning and at bedtime. 02/08/23   Wanda Plump, MD  metoprolol tartrate (LOPRESSOR) 25 MG tablet Place 1 tablet (25 mg total) into feeding tube 2 (two) times daily. 12/08/22   Wanda Plump, MD  Nutritional Supplements (FEEDING SUPPLEMENT, JEVITY 1.5 CAL/FIBER,) LIQD Place  237 mLs into feeding tube daily. 01/28/23 04/28/23  Kathlen Mody, MD  Nutritional Supplements (FEEDING SUPPLEMENT, JEVITY 1.5 CAL/FIBER,) LIQD Place 474 mLs into feeding tube in the morning, at noon, and at bedtime. 01/28/23 04/28/23  Kathlen Mody, MD  ondansetron (ZOFRAN) 4 MG tablet Take 1 tablet (4 mg total) by mouth every 8 (eight) hours as needed for nausea or vomiting. 02/11/23   Eulis Foster, FNP  polyethylene glycol (MIRALAX / GLYCOLAX) 17 g packet Place 17 g into feeding tube daily as needed. 01/28/23   Kathlen Mody, MD  riluzole (RILUTEK) 50 MG tablet Place 50 mg into feeding tube every 12 (twelve) hours. 02/02/22   [provider]  senna-docusate (SENOKOT-S) 8.6-50 MG tablet Take 2 tablets by mouth at bedtime as needed for mild constipation. 01/28/23   Kathlen Mody, MD  traZODone (DESYREL) 100 MG tablet Place 1 tablet (100 mg total) into feeding tube at bedtime. May repeat 1 tablet (100mg ) 3 hours later if needed. 02/19/23   Wanda Plump, MD  triamcinolone cream (KENALOG) 0.1 %  12/17/14   [provider]  Water For Irrigation, Sterile (FREE WATER) SOLN Place 250 mLs into feeding tube every 6 (six) hours. 01/28/23   Kathlen Mody, MD      Allergies    Patient has no known allergies.    Review of Systems   Review of Systems  Respiratory:  Positive for shortness of breath.     Physical Exam Updated Vital Signs BP 132/81   Pulse 77   Temp 98.6 F (37 C) (Oral)   Resp 18   Ht 6\' 4"  (1.93 m)   Wt 104.3 kg   SpO2 100%   BMI 27.99 kg/m  Physical Exam Vitals and nursing note reviewed.  Constitutional:      Appearance: He is well-developed.  HENT:     Head: Normocephalic and atraumatic.  Cardiovascular:     Rate and Rhythm: Normal rate.  Pulmonary:     Effort: Pulmonary effort is normal. Tachypnea present. No respiratory distress.     Breath sounds: Decreased breath sounds present.  Abdominal:     General: There is no distension.  Musculoskeletal:        General:  Normal range of motion.     Cervical back: Normal range of motion.     Right lower leg: No edema.     Left lower leg: No edema.  Neurological:     Mental Status: He is alert.     ED Results / Procedures / Treatments   Labs (all labs ordered are listed, but only abnormal results are displayed) Labs Reviewed  CBC WITH DIFFERENTIAL/PLATELET - Abnormal; Notable for the following components:      Result Value   RBC 4.21 (*)    Neutro  Abs 7.9 (*)    Abs Immature Granulocytes 0.11 (*)    All other components within normal limits  COMPREHENSIVE METABOLIC PANEL - Abnormal; Notable for the following components:   Sodium 128 (*)    Chloride 88 (*)    Glucose, Bld 238 (*)    BUN 34 (*)    Calcium 8.7 (*)    Total Protein 6.0 (*)    Albumin 3.1 (*)    AST 48 (*)    ALT 87 (*)    All other components within normal limits  LIPASE, BLOOD  BRAIN NATRIURETIC PEPTIDE  TROPONIN I (HIGH SENSITIVITY)  TROPONIN I (HIGH SENSITIVITY)    EKG EKG Interpretation  Date/Time:  Monday Mar 01 2023 01:19:51 EDT Ventricular Rate:  96 PR Interval:  180 QRS Duration: 83 QT Interval:  321 QTC Calculation: 406 R Axis:   2 Text Interpretation: Sinus rhythm Ventricular premature complex Nonspecific T abnrm, anterolateral leads new TWI in i/avl compared to 3/31 Confirmed by Marily Memos [phone]) on 03/01/2023 1:59:00 AM  Radiology DG Chest Portable 1 View  Result Date: 03/01/2023 CLINICAL DATA:  Shortness of breath.  Evaluate for pneumonia EXAM: PORTABLE CHEST 1 VIEW COMPARISON:  02/15/2023 FINDINGS: Low lung volumes, bibasilar atelectasis. Heart and mediastinal contours within normal limits. No effusions or acute bony abnormality. IMPRESSION: Low lung volumes, bibasilar atelectasis. Electronically Signed   By: Charlett Nose M.D.   On: 03/01/2023 01:54    Procedures Procedures    Medications Ordered in ED Medications  fentaNYL (SUBLIMAZE) injection 50 mcg (50 mcg Intravenous Given 03/01/23 0254)   ketorolac (TORADOL) 30 MG/ML injection 15 mg (15 mg Intravenous Given 03/01/23 0254)  diphenhydrAMINE-zinc acetate (BENADRYL) 2-0.1 % cream ( Topical Given 03/01/23 6045)    ED Course/ Medical Decision Making/ A&P                             Medical Decision Making Amount and/or Complexity of Data Reviewed Labs: ordered. Radiology: ordered.  Risk OTC drugs. Prescription drug management.  Will start just a chest x-ray and EKG for now.  I need his wife to come in so we can discuss goals of care and how aggressive they want to be in working things up.  Will continue on his home CPAP for now. After conversation with him via his phone and her and daughter will go ahead and initiate a workup understanding that there is very low likelihood of any invasive procedures to be done based on it.  Will disposition based on results.   Labs are overall reassuring.  His sodium is a bit low but when corrected for his blood sugar it similar to his previous baseline.  Specifically no evidence of heart attack heart failure.  Chest x-ray looks okay lungs are okay seem to be at his baseline respiratory status.  He appears well at this time.  After more long discussions regarding goals of care with him and his wife will go ahead and prescribe some pain medication as well he can take per his G-tube.  No other acute indication for admission or further workup at this time.  Patient stable to be discharged.  Will engage transition of care team to make sure that hospice gets initiated as I do feel patient is very near the end of his life.   Final Clinical Impression(s) / ED Diagnoses Final diagnoses:  Chest pain, unspecified type    Rx / DC Orders ED Discharge  Orders          Ordered    oxyCODONE (ROXICODONE) 5 MG immediate release tablet  Every 4 hours PRN        03/01/23 0530              Evanny Ellerbe, Barbara Cower, MD 03/01/23 0630

## 2023-03-01 NOTE — Telephone Encounter (Signed)
RNCM consulted regarding home hospice options.  RNCM left secure voice mail for her to call us back to begin referral process.

## 2023-03-01 NOTE — ED Notes (Signed)
PT[facility] called again for update. Should be here any minute.

## 2023-03-26 ENCOUNTER — Other Ambulatory Visit: Payer: Self-pay | Admitting: Internal Medicine

## 2023-03-27 DEATH — deceased

## 2023-04-12 ENCOUNTER — Encounter: Payer: Medicare Other | Admitting: Internal Medicine

## 2023-10-01 ENCOUNTER — Other Ambulatory Visit: Payer: Self-pay

## 2023-10-05 ENCOUNTER — Other Ambulatory Visit: Payer: Self-pay

## 2023-11-23 ENCOUNTER — Other Ambulatory Visit: Payer: Self-pay

## 2023-12-22 ENCOUNTER — Other Ambulatory Visit (HOSPITAL_BASED_OUTPATIENT_CLINIC_OR_DEPARTMENT_OTHER): Payer: Self-pay

## 2024-01-13 ENCOUNTER — Other Ambulatory Visit: Payer: Self-pay

## 2024-04-17 ENCOUNTER — Other Ambulatory Visit: Payer: Self-pay

## 2024-05-08 ENCOUNTER — Other Ambulatory Visit: Payer: Self-pay
